# Patient Record
Sex: Female | Born: 1975
Health system: Southern US, Community
[De-identification: ages and names within clinical notes are randomized; demographics above are authoritative.]

## PROBLEM LIST (undated history)

## (undated) DIAGNOSIS — Q8500 Neurofibromatosis, unspecified: Secondary | ICD-10-CM

## (undated) DIAGNOSIS — Z8619 Personal history of other infectious and parasitic diseases: Secondary | ICD-10-CM

## (undated) DIAGNOSIS — D649 Anemia, unspecified: Secondary | ICD-10-CM

## (undated) DIAGNOSIS — Z8759 Personal history of other complications of pregnancy, childbirth and the puerperium: Secondary | ICD-10-CM

## (undated) DIAGNOSIS — J45909 Unspecified asthma, uncomplicated: Secondary | ICD-10-CM

## (undated) DIAGNOSIS — F419 Anxiety disorder, unspecified: Secondary | ICD-10-CM

## (undated) DIAGNOSIS — Z86018 Personal history of other benign neoplasm: Secondary | ICD-10-CM

## (undated) DIAGNOSIS — J189 Pneumonia, unspecified organism: Secondary | ICD-10-CM

## (undated) DIAGNOSIS — J302 Other seasonal allergic rhinitis: Secondary | ICD-10-CM

## (undated) HISTORY — DX: Neurofibromatosis, unspecified: Q85.00

## (undated) HISTORY — PX: WISDOM TOOTH EXTRACTION: SHX21

## (undated) HISTORY — DX: Anemia, unspecified: D64.9

## (undated) HISTORY — PX: ABDOMINAL HYSTERECTOMY: SHX81

---

## 1997-09-28 DIAGNOSIS — Z8759 Personal history of other complications of pregnancy, childbirth and the puerperium: Secondary | ICD-10-CM

## 1997-09-28 HISTORY — DX: Personal history of other complications of pregnancy, childbirth and the puerperium: Z87.59

## 1998-09-28 HISTORY — PX: LAPAROSCOPY FOR ECTOPIC PREGNANCY: SUR765

## 2001-08-15 ENCOUNTER — Other Ambulatory Visit: Admission: RE | Admit: 2001-08-15 | Discharge: 2001-08-15 | Payer: Self-pay | Admitting: *Deleted

## 2002-01-16 ENCOUNTER — Ambulatory Visit (HOSPITAL_COMMUNITY): Admission: RE | Admit: 2002-01-16 | Discharge: 2002-01-16 | Payer: Self-pay | Admitting: Neurology

## 2002-01-16 ENCOUNTER — Encounter: Payer: Self-pay | Admitting: Neurology

## 2002-01-20 ENCOUNTER — Encounter: Payer: Self-pay | Admitting: Neurology

## 2002-01-20 ENCOUNTER — Ambulatory Visit (HOSPITAL_COMMUNITY): Admission: RE | Admit: 2002-01-20 | Discharge: 2002-01-20 | Payer: Self-pay | Admitting: Neurology

## 2002-05-30 ENCOUNTER — Encounter: Payer: Self-pay | Admitting: Emergency Medicine

## 2002-05-30 ENCOUNTER — Encounter: Admission: RE | Admit: 2002-05-30 | Discharge: 2002-05-30 | Payer: Self-pay | Admitting: Emergency Medicine

## 2002-08-16 ENCOUNTER — Other Ambulatory Visit: Admission: RE | Admit: 2002-08-16 | Discharge: 2002-08-16 | Payer: Self-pay | Admitting: Obstetrics and Gynecology

## 2003-05-31 ENCOUNTER — Other Ambulatory Visit: Admission: RE | Admit: 2003-05-31 | Discharge: 2003-05-31 | Payer: Self-pay | Admitting: Obstetrics and Gynecology

## 2003-06-15 ENCOUNTER — Ambulatory Visit (HOSPITAL_COMMUNITY): Admission: RE | Admit: 2003-06-15 | Discharge: 2003-06-15 | Payer: Self-pay | Admitting: Neurology

## 2003-06-15 ENCOUNTER — Encounter: Payer: Self-pay | Admitting: Neurology

## 2003-08-17 ENCOUNTER — Other Ambulatory Visit: Admission: RE | Admit: 2003-08-17 | Discharge: 2003-08-17 | Payer: Self-pay | Admitting: Obstetrics and Gynecology

## 2003-09-29 DIAGNOSIS — Z8619 Personal history of other infectious and parasitic diseases: Secondary | ICD-10-CM

## 2003-09-29 HISTORY — DX: Personal history of other infectious and parasitic diseases: Z86.19

## 2004-05-15 ENCOUNTER — Encounter: Admission: RE | Admit: 2004-05-15 | Discharge: 2004-05-15 | Payer: Self-pay | Admitting: Emergency Medicine

## 2004-05-18 ENCOUNTER — Encounter: Admission: RE | Admit: 2004-05-18 | Discharge: 2004-05-18 | Payer: Self-pay | Admitting: Emergency Medicine

## 2004-06-04 ENCOUNTER — Encounter: Admission: RE | Admit: 2004-06-04 | Discharge: 2004-06-04 | Payer: Self-pay | Admitting: Emergency Medicine

## 2004-08-19 ENCOUNTER — Other Ambulatory Visit: Admission: RE | Admit: 2004-08-19 | Discharge: 2004-08-19 | Payer: Self-pay | Admitting: Obstetrics and Gynecology

## 2005-04-24 ENCOUNTER — Emergency Department (HOSPITAL_COMMUNITY): Admission: EM | Admit: 2005-04-24 | Discharge: 2005-04-25 | Payer: Self-pay | Admitting: Emergency Medicine

## 2005-04-30 ENCOUNTER — Encounter: Admission: RE | Admit: 2005-04-30 | Discharge: 2005-04-30 | Payer: Self-pay | Admitting: Emergency Medicine

## 2005-06-21 ENCOUNTER — Encounter: Admission: RE | Admit: 2005-06-21 | Discharge: 2005-06-21 | Payer: Self-pay | Admitting: Neurology

## 2005-06-23 ENCOUNTER — Encounter: Admission: RE | Admit: 2005-06-23 | Discharge: 2005-06-23 | Payer: Self-pay | Admitting: Neurology

## 2005-09-01 ENCOUNTER — Other Ambulatory Visit: Admission: RE | Admit: 2005-09-01 | Discharge: 2005-09-01 | Payer: Self-pay | Admitting: Obstetrics and Gynecology

## 2005-09-15 ENCOUNTER — Other Ambulatory Visit: Admission: RE | Admit: 2005-09-15 | Discharge: 2005-09-15 | Payer: Self-pay | Admitting: Obstetrics and Gynecology

## 2006-01-27 ENCOUNTER — Encounter: Payer: Self-pay | Admitting: Neurology

## 2006-03-04 ENCOUNTER — Other Ambulatory Visit: Admission: RE | Admit: 2006-03-04 | Discharge: 2006-03-04 | Payer: Self-pay | Admitting: Obstetrics & Gynecology

## 2006-10-14 ENCOUNTER — Other Ambulatory Visit: Admission: RE | Admit: 2006-10-14 | Discharge: 2006-10-14 | Payer: Self-pay | Admitting: Obstetrics & Gynecology

## 2007-06-15 ENCOUNTER — Encounter: Admission: RE | Admit: 2007-06-15 | Discharge: 2007-06-15 | Payer: Self-pay | Admitting: Emergency Medicine

## 2007-10-24 ENCOUNTER — Other Ambulatory Visit: Admission: RE | Admit: 2007-10-24 | Discharge: 2007-10-24 | Payer: Self-pay | Admitting: Obstetrics and Gynecology

## 2008-10-25 ENCOUNTER — Other Ambulatory Visit: Admission: RE | Admit: 2008-10-25 | Discharge: 2008-10-25 | Payer: Self-pay | Admitting: Obstetrics and Gynecology

## 2008-11-02 ENCOUNTER — Other Ambulatory Visit: Admission: RE | Admit: 2008-11-02 | Discharge: 2008-11-02 | Payer: Self-pay | Admitting: Obstetrics and Gynecology

## 2010-11-24 ENCOUNTER — Emergency Department (HOSPITAL_COMMUNITY): Payer: No Typology Code available for payment source

## 2010-11-24 ENCOUNTER — Emergency Department (HOSPITAL_COMMUNITY)
Admission: EM | Admit: 2010-11-24 | Discharge: 2010-11-24 | Disposition: A | Discharge status: Home / Self Care | Payer: No Typology Code available for payment source | Attending: Emergency Medicine | Admitting: Emergency Medicine

## 2010-11-24 DIAGNOSIS — M549 Dorsalgia, unspecified: Secondary | ICD-10-CM | POA: Insufficient documentation

## 2010-11-24 DIAGNOSIS — R0789 Other chest pain: Secondary | ICD-10-CM | POA: Insufficient documentation

## 2010-11-24 DIAGNOSIS — M25569 Pain in unspecified knee: Secondary | ICD-10-CM | POA: Insufficient documentation

## 2010-11-24 DIAGNOSIS — Z043 Encounter for examination and observation following other accident: Secondary | ICD-10-CM | POA: Insufficient documentation

## 2011-05-04 ENCOUNTER — Ambulatory Visit: Payer: No Typology Code available for payment source | Admitting: Internal Medicine

## 2012-04-27 ENCOUNTER — Telehealth: Payer: Self-pay

## 2012-04-27 NOTE — Telephone Encounter (Signed)
Per CY-if okay with Dr Sharyn Lull we can give his vaccine here per his protocol for her convenience. If she is not a patient at [facility] then CY should see her once to create a chart.

## 2012-04-28 NOTE — Telephone Encounter (Signed)
Katie,Braiden called back. She just saw Dr.Koslow and doesn't want to have to pay for a new pt.visit. I explained to her That it is Dr.Young's protocol to see the pt.if we're going to administer another Dr.'s vaccine. She went on to say Dr.Koslow said she shouldn't need an ov. Will you please call her back and see if you have better luck. [phone] (You'll have to leave a message,she will call you back.)

## 2012-04-28 NOTE — Telephone Encounter (Signed)
I called Lacey Lee this morning,lmom ask her to call me back.

## 2012-04-29 NOTE — Telephone Encounter (Signed)
Per CY-he is okay with patient getting injections here; she will need to at least see CY once as OV to establish-this way the patient and CY know who each other are. Also, we will have to get all vaccine information from Dr Annitta Jersey office and how he gives the injection. We will also obtain a"go-to" person to speak directly with at [facility] office in case there are questions/concerns about patient or vaccine. Pt is aware of this and has appt on 05-20-12 at 1115am with CY. Pt is due for 1st shot on 05-19-12 and will speak with Koslow then. Pt will keep in touch with me. I will sign off on message.

## 2012-05-19 ENCOUNTER — Encounter: Payer: Self-pay | Admitting: Internal Medicine

## 2012-05-20 ENCOUNTER — Ambulatory Visit (INDEPENDENT_AMBULATORY_CARE_PROVIDER_SITE_OTHER): Payer: BC Managed Care – PPO | Admitting: Internal Medicine

## 2012-05-20 ENCOUNTER — Encounter: Payer: Self-pay | Admitting: Internal Medicine

## 2012-05-20 VITALS — BP 128/88 | HR 89 | Ht 62.0 in | Wt 223.0 lb

## 2012-05-20 DIAGNOSIS — J45909 Unspecified asthma, uncomplicated: Secondary | ICD-10-CM

## 2012-05-20 DIAGNOSIS — J4520 Mild intermittent asthma, uncomplicated: Secondary | ICD-10-CM

## 2012-05-20 DIAGNOSIS — J309 Allergic rhinitis, unspecified: Secondary | ICD-10-CM

## 2012-05-20 DIAGNOSIS — J3089 Other allergic rhinitis: Secondary | ICD-10-CM

## 2012-05-20 NOTE — Patient Instructions (Addendum)
Ok to start Dr Kathyrn Lass vaccine injections here per his direction.

## 2012-05-20 NOTE — Progress Notes (Signed)
05/20/12- 36 yoF never smoker, working here. 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 She gives a history of allergic rhinitis with broadly positive reaction on skin testing. Asthma has been mild and intermittent, particularly with animal exposure. She is otherwise healthy and not pregnant with no cardiopulmonary disease and no history of anaphylaxis. 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  Prior to Admission medications   Medication Sig Start Date End Date Taking? Authorizing Provider  albuterol (PROVENTIL HFA;VENTOLIN HFA) 108 (90 BASE) MCG/ACT inhaler Inhale 2 puffs into the lungs every 6 (six) hours as needed.   Yes Historical Provider, MD  cetirizine (ZYRTEC) 10 MG tablet Take 10 mg by mouth daily.   Yes Historical Provider, MD  EPIPEN 2-PAK 0.3 MG/0.3ML DEVI  05/19/12  Yes Historical Provider, MD  etonogestrel-ethinyl estradiol (NUVARING) 0.12-0.015 MG/24HR vaginal ring Place 1 each vaginally every 28 (twenty-eight) days. Insert vaginally and leave in place for 3 consecutive weeks, then remove for 1 week.   Yes Historical Provider, MD  Cholecalciferol (VITAMIN D PO) Take by mouth.    Historical Provider, MD   Past Medical History  Diagnosis Date  . Neurofibromatosis   . Ectopic pregnancy    Past Surgical History  Procedure Date  . Fallopian tube removed in college   Family History  Problem Relation Age of Onset  . Allergies Mother     food  . Lung cancer Father    History   Social History  . Marital Status: Single    Spouse Name: N/A    Number of Children: 0  . Years of Education: N/A   Occupational History  . 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 Smoking status: Never Smoker   . Smokeless tobacco: Not on file  . Alcohol Use: No  . Drug Use: No  . Sexually Active: Not on file   Other Topics Concern  . Not  on file   Social History Narrative  . No narrative on file   ROS-see HPI Constitutional:   No-   weight loss, night sweats, fevers, chills, fatigue, lassitude. HEENT:   No-  headaches, difficulty swallowing, tooth/dental problems, sore throat,       No-  sneezing, itching, ear ache, nasal congestion, post nasal drip,  CV:  No-   chest pain, orthopnea, PND, swelling in lower extremities, anasarca, dizziness, palpitations Resp: No-   shortness of breath with exertion or at rest.              No-   productive cough,  No non-productive cough,  No- coughing up of blood.              No-   change in color of mucus.  No- wheezing.     OBJ- Physical Exam General- Alert, Oriented, Affect-appropriate, Distress- none acute Skin- rash-none, lesions- none, excoriation- none Lymphadenopathy- none Head- atraumatic            Eyes- Gross vision intact, PERRLA, conjunctivae and secretions clear            Ears- Hearing, canals-normal            Nose- + pale edematous nasal mucosa, no-Septal dev, mucus, polyps, erosion, perforation             Throat- Mallampati II-III , mucosa clear , drainage-  none, tonsils- atrophic Neck- flexible , trachea midline, no stridor , thyroid nl, carotid no bruit Chest - symmetrical excursion , unlabored           Heart/CV- RRR , no murmur , no gallop  , no rub, nl s1 s2                           - JVD- none , edema- none, stasis changes- none, varices- none           Lung- clear to P&A, wheeze- none, cough- none , dullness-none, rub- none           Chest wall-  Abd-  Br/ Gen/ Rectal- Not done, not indicated Extrem- cyanosis- none, clubbing, none, atrophy- none, strength- nl Neuro- grossly intact to observation

## 2012-05-29 ENCOUNTER — Encounter: Payer: Self-pay | Admitting: Internal Medicine

## 2012-05-29 DIAGNOSIS — J4520 Mild intermittent asthma, uncomplicated: Secondary | ICD-10-CM | POA: Insufficient documentation

## 2012-05-29 DIAGNOSIS — J302 Other seasonal allergic rhinitis: Secondary | ICD-10-CM | POA: Insufficient documentation

## 2012-05-29 NOTE — Assessment & Plan Note (Signed)
Our allergy laboratory will give Dr. Kathyrn Lass vaccine per his directions. We reinforced his discussion of risk and routine care.Marland Kitchen

## 2012-06-01 ENCOUNTER — Ambulatory Visit (INDEPENDENT_AMBULATORY_CARE_PROVIDER_SITE_OTHER): Payer: BC Managed Care – PPO

## 2012-06-01 DIAGNOSIS — J309 Allergic rhinitis, unspecified: Secondary | ICD-10-CM

## 2012-06-06 ENCOUNTER — Ambulatory Visit (INDEPENDENT_AMBULATORY_CARE_PROVIDER_SITE_OTHER): Payer: BC Managed Care – PPO

## 2012-06-06 DIAGNOSIS — J309 Allergic rhinitis, unspecified: Secondary | ICD-10-CM

## 2012-06-13 ENCOUNTER — Ambulatory Visit (INDEPENDENT_AMBULATORY_CARE_PROVIDER_SITE_OTHER): Payer: BC Managed Care – PPO

## 2012-06-13 DIAGNOSIS — J309 Allergic rhinitis, unspecified: Secondary | ICD-10-CM

## 2012-06-20 ENCOUNTER — Ambulatory Visit (INDEPENDENT_AMBULATORY_CARE_PROVIDER_SITE_OTHER): Payer: BC Managed Care – PPO

## 2012-06-20 DIAGNOSIS — J309 Allergic rhinitis, unspecified: Secondary | ICD-10-CM

## 2012-07-04 ENCOUNTER — Ambulatory Visit (INDEPENDENT_AMBULATORY_CARE_PROVIDER_SITE_OTHER): Payer: BC Managed Care – PPO

## 2012-07-04 DIAGNOSIS — J309 Allergic rhinitis, unspecified: Secondary | ICD-10-CM

## 2012-07-11 ENCOUNTER — Ambulatory Visit (INDEPENDENT_AMBULATORY_CARE_PROVIDER_SITE_OTHER): Payer: BC Managed Care – PPO

## 2012-07-11 DIAGNOSIS — J309 Allergic rhinitis, unspecified: Secondary | ICD-10-CM

## 2012-07-25 ENCOUNTER — Ambulatory Visit (INDEPENDENT_AMBULATORY_CARE_PROVIDER_SITE_OTHER): Payer: BC Managed Care – PPO

## 2012-07-25 DIAGNOSIS — J309 Allergic rhinitis, unspecified: Secondary | ICD-10-CM

## 2012-08-01 ENCOUNTER — Ambulatory Visit (INDEPENDENT_AMBULATORY_CARE_PROVIDER_SITE_OTHER): Payer: BC Managed Care – PPO

## 2012-08-01 DIAGNOSIS — J309 Allergic rhinitis, unspecified: Secondary | ICD-10-CM

## 2012-08-08 ENCOUNTER — Ambulatory Visit (INDEPENDENT_AMBULATORY_CARE_PROVIDER_SITE_OTHER): Payer: BC Managed Care – PPO

## 2012-08-08 DIAGNOSIS — J309 Allergic rhinitis, unspecified: Secondary | ICD-10-CM

## 2012-08-17 ENCOUNTER — Ambulatory Visit (INDEPENDENT_AMBULATORY_CARE_PROVIDER_SITE_OTHER): Payer: BC Managed Care – PPO

## 2012-08-17 DIAGNOSIS — J309 Allergic rhinitis, unspecified: Secondary | ICD-10-CM

## 2012-08-22 ENCOUNTER — Ambulatory Visit (INDEPENDENT_AMBULATORY_CARE_PROVIDER_SITE_OTHER): Payer: BC Managed Care – PPO

## 2012-08-22 DIAGNOSIS — J309 Allergic rhinitis, unspecified: Secondary | ICD-10-CM

## 2012-09-09 ENCOUNTER — Ambulatory Visit (INDEPENDENT_AMBULATORY_CARE_PROVIDER_SITE_OTHER): Payer: Managed Care, Other (non HMO)

## 2012-09-09 DIAGNOSIS — J309 Allergic rhinitis, unspecified: Secondary | ICD-10-CM

## 2012-09-16 ENCOUNTER — Ambulatory Visit (INDEPENDENT_AMBULATORY_CARE_PROVIDER_SITE_OTHER): Payer: Managed Care, Other (non HMO)

## 2012-09-16 DIAGNOSIS — J309 Allergic rhinitis, unspecified: Secondary | ICD-10-CM

## 2012-09-23 ENCOUNTER — Ambulatory Visit (INDEPENDENT_AMBULATORY_CARE_PROVIDER_SITE_OTHER): Payer: Managed Care, Other (non HMO)

## 2012-09-23 DIAGNOSIS — J309 Allergic rhinitis, unspecified: Secondary | ICD-10-CM

## 2012-09-30 ENCOUNTER — Ambulatory Visit (INDEPENDENT_AMBULATORY_CARE_PROVIDER_SITE_OTHER): Payer: Managed Care, Other (non HMO)

## 2012-09-30 DIAGNOSIS — J309 Allergic rhinitis, unspecified: Secondary | ICD-10-CM

## 2012-10-10 ENCOUNTER — Ambulatory Visit (INDEPENDENT_AMBULATORY_CARE_PROVIDER_SITE_OTHER): Payer: Self-pay

## 2012-10-10 DIAGNOSIS — J309 Allergic rhinitis, unspecified: Secondary | ICD-10-CM

## 2012-10-14 ENCOUNTER — Ambulatory Visit (INDEPENDENT_AMBULATORY_CARE_PROVIDER_SITE_OTHER): Payer: Self-pay

## 2012-10-14 DIAGNOSIS — J309 Allergic rhinitis, unspecified: Secondary | ICD-10-CM

## 2012-10-21 ENCOUNTER — Ambulatory Visit (INDEPENDENT_AMBULATORY_CARE_PROVIDER_SITE_OTHER): Payer: Self-pay

## 2012-10-21 DIAGNOSIS — J309 Allergic rhinitis, unspecified: Secondary | ICD-10-CM

## 2012-10-28 ENCOUNTER — Ambulatory Visit (INDEPENDENT_AMBULATORY_CARE_PROVIDER_SITE_OTHER): Payer: Self-pay

## 2012-10-28 DIAGNOSIS — J309 Allergic rhinitis, unspecified: Secondary | ICD-10-CM

## 2012-11-04 ENCOUNTER — Ambulatory Visit (INDEPENDENT_AMBULATORY_CARE_PROVIDER_SITE_OTHER): Payer: Self-pay

## 2012-11-04 DIAGNOSIS — J309 Allergic rhinitis, unspecified: Secondary | ICD-10-CM

## 2012-11-11 ENCOUNTER — Ambulatory Visit: Payer: Self-pay

## 2012-11-25 ENCOUNTER — Ambulatory Visit (INDEPENDENT_AMBULATORY_CARE_PROVIDER_SITE_OTHER): Payer: Self-pay

## 2012-11-30 ENCOUNTER — Encounter (HOSPITAL_COMMUNITY): Payer: Self-pay | Admitting: Pharmacist

## 2012-12-02 ENCOUNTER — Ambulatory Visit: Payer: Self-pay

## 2012-12-09 ENCOUNTER — Encounter (HOSPITAL_COMMUNITY)
Admission: RE | Admit: 2012-12-09 | Discharge: 2012-12-09 | Disposition: A | Discharge status: Home / Self Care | Payer: PRIVATE HEALTH INSURANCE | Source: Ambulatory Visit | Attending: Obstetrics and Gynecology | Admitting: Obstetrics and Gynecology

## 2012-12-09 ENCOUNTER — Encounter (HOSPITAL_COMMUNITY): Payer: Self-pay

## 2012-12-09 HISTORY — DX: Unspecified asthma, uncomplicated: J45.909

## 2012-12-09 HISTORY — DX: Other seasonal allergic rhinitis: J30.2

## 2012-12-09 LAB — SURGICAL PCR SCREEN
MRSA, PCR: NEGATIVE
Staphylococcus aureus: NEGATIVE

## 2012-12-09 LAB — CBC
Hemoglobin: 12.8 g/dL (ref 12.0–15.0)
MCHC: 33.5 g/dL (ref 30.0–36.0)
MCV: 89.3 fL (ref 78.0–100.0)
WBC: 7.2 10*3/uL (ref 4.0–10.5)

## 2012-12-09 NOTE — Patient Instructions (Addendum)
   Your procedure is scheduled ZO:[id number] March 21st  Enter through the Main Entrance of [facility] at:9:30am Pick up the phone at the desk and dial [phone] and inform us of your arrival.  Please call this number if you have any problems the morning of surgery: [phone]  Remember: Do not eat or drink anything after midnight on Thursday Please bring your inhaler with you day of surgery  Do not wear jewelry, make-up, or FINGER nail polish No metal in your hair or on your body. Do not wear lotions, powders, perfumes. You may wear deodorant.  Please use your CHG wash as directed prior to surgery.  Do not shave anywhere for at least 12 hours prior to first CHG shower.  Do not bring valuables to the hospital.   Leave suitcase in the car. After Surgery it may be brought to your room. For patients being admitted to the hospital, checkout time is 11:00am the day of discharge.  Patients discharged on the day of surgery will not be allowed to drive home.

## 2012-12-16 ENCOUNTER — Encounter (HOSPITAL_COMMUNITY): Payer: Self-pay | Admitting: Anesthesiology

## 2012-12-16 ENCOUNTER — Encounter (HOSPITAL_COMMUNITY)
Admission: RE | Disposition: A | Discharge status: Home / Self Care | Payer: Self-pay | Source: Ambulatory Visit | Attending: Obstetrics and Gynecology

## 2012-12-16 ENCOUNTER — Ambulatory Visit (HOSPITAL_COMMUNITY)
Admission: RE | Admit: 2012-12-16 | Discharge: 2012-12-17 | Disposition: A | Discharge status: Home / Self Care | Payer: PRIVATE HEALTH INSURANCE | Source: Ambulatory Visit | Attending: Obstetrics and Gynecology | Admitting: Obstetrics and Gynecology

## 2012-12-16 ENCOUNTER — Ambulatory Visit (HOSPITAL_COMMUNITY): Payer: PRIVATE HEALTH INSURANCE | Admitting: Anesthesiology

## 2012-12-16 DIAGNOSIS — D251 Intramural leiomyoma of uterus: Secondary | ICD-10-CM | POA: Insufficient documentation

## 2012-12-16 DIAGNOSIS — N803 Endometriosis of pelvic peritoneum, unspecified: Secondary | ICD-10-CM | POA: Insufficient documentation

## 2012-12-16 DIAGNOSIS — N920 Excessive and frequent menstruation with regular cycle: Secondary | ICD-10-CM

## 2012-12-16 DIAGNOSIS — D250 Submucous leiomyoma of uterus: Secondary | ICD-10-CM

## 2012-12-16 DIAGNOSIS — N802 Endometriosis of fallopian tube: Secondary | ICD-10-CM | POA: Insufficient documentation

## 2012-12-16 DIAGNOSIS — N80209 Endometriosis of unspecified fallopian tube, unspecified depth: Secondary | ICD-10-CM | POA: Insufficient documentation

## 2012-12-16 HISTORY — PX: ROBOTIC ASSISTED LAPAROSCOPIC LYSIS OF ADHESION: SHX6080

## 2012-12-16 HISTORY — PX: ROBOT ASSISTED MYOMECTOMY: SHX5142

## 2012-12-16 LAB — TYPE AND SCREEN

## 2012-12-16 LAB — ABO/RH: ABO/RH(D): O POS

## 2012-12-16 SURGERY — ROBOTIC ASSISTED MYOMECTOMY
Anesthesia: General | Site: Abdomen | Wound class: Clean Contaminated

## 2012-12-16 MED ORDER — FENTANYL CITRATE 0.05 MG/ML IJ SOLN
INTRAMUSCULAR | Status: AC
  Administered 2012-12-16: 50 ug via INTRAVENOUS
  Filled 2012-12-16: qty 2

## 2012-12-16 MED ORDER — LACTATED RINGERS IV SOLN
INTRAVENOUS | Status: DC
  Administered 2012-12-16 (×3): via INTRAVENOUS

## 2012-12-16 MED ORDER — INDIGOTINDISULFONATE SODIUM 8 MG/ML IJ SOLN
INTRAMUSCULAR | Status: DC | PRN
  Administered 2012-12-16: 1 mL via INTRAVENOUS

## 2012-12-16 MED ORDER — BUPIVACAINE-EPINEPHRINE PF 0.25-1:200000 % IJ SOLN
INTRAMUSCULAR | Status: AC
  Filled 2012-12-16: qty 30

## 2012-12-16 MED ORDER — ROCURONIUM BROMIDE 100 MG/10ML IV SOLN
INTRAVENOUS | Status: DC | PRN
  Administered 2012-12-16: 45 mg via INTRAVENOUS
  Administered 2012-12-16: 20 mg via INTRAVENOUS
  Administered 2012-12-16: 5 mg via INTRAVENOUS

## 2012-12-16 MED ORDER — MIDAZOLAM HCL 2 MG/2ML IJ SOLN
INTRAMUSCULAR | Status: AC
  Filled 2012-12-16: qty 2

## 2012-12-16 MED ORDER — LACTATED RINGERS IR SOLN
Status: DC | PRN
  Administered 2012-12-16: 100 mL

## 2012-12-16 MED ORDER — PROPOFOL 10 MG/ML IV EMUL
INTRAVENOUS | Status: AC
  Filled 2012-12-16: qty 20

## 2012-12-16 MED ORDER — ARTIFICIAL TEARS OP OINT
TOPICAL_OINTMENT | OPHTHALMIC | Status: AC
  Filled 2012-12-16: qty 3.5

## 2012-12-16 MED ORDER — ACETAMINOPHEN 10 MG/ML IV SOLN
1000.0000 mg | Freq: Once | INTRAVENOUS | Status: AC
  Administered 2012-12-16: 1000 mg via INTRAVENOUS

## 2012-12-16 MED ORDER — DEXAMETHASONE SODIUM PHOSPHATE 4 MG/ML IJ SOLN
INTRAMUSCULAR | Status: DC | PRN
  Administered 2012-12-16: 10 mg via INTRAVENOUS

## 2012-12-16 MED ORDER — LIDOCAINE HCL (CARDIAC) 20 MG/ML IV SOLN
INTRAVENOUS | Status: DC | PRN
  Administered 2012-12-16 (×2): 50 mg via INTRAVENOUS

## 2012-12-16 MED ORDER — GLYCOPYRROLATE 0.2 MG/ML IJ SOLN
INTRAMUSCULAR | Status: DC | PRN
  Administered 2012-12-16: .08 mg via INTRAVENOUS

## 2012-12-16 MED ORDER — NEOSTIGMINE METHYLSULFATE 1 MG/ML IJ SOLN
INTRAMUSCULAR | Status: AC
  Filled 2012-12-16: qty 1

## 2012-12-16 MED ORDER — ONDANSETRON HCL 4 MG/2ML IJ SOLN: 4.0000 mg | Freq: Four times a day (QID) | INTRAMUSCULAR | Status: DC | PRN

## 2012-12-16 MED ORDER — FENTANYL CITRATE 0.05 MG/ML IJ SOLN
INTRAMUSCULAR | Status: AC
  Filled 2012-12-16: qty 2

## 2012-12-16 MED ORDER — DEXAMETHASONE SODIUM PHOSPHATE 10 MG/ML IJ SOLN
INTRAMUSCULAR | Status: AC
  Filled 2012-12-16: qty 1

## 2012-12-16 MED ORDER — HYDROMORPHONE HCL PF 1 MG/ML IJ SOLN: 0.2000 mg | INTRAMUSCULAR | Status: DC | PRN

## 2012-12-16 MED ORDER — CEFAZOLIN SODIUM-DEXTROSE 2-3 GM-% IV SOLR
INTRAVENOUS | Status: AC
  Filled 2012-12-16: qty 50

## 2012-12-16 MED ORDER — SODIUM CHLORIDE 0.9 % IJ SOLN
INTRAMUSCULAR | Status: DC | PRN
  Administered 2012-12-16: 50 mL via INTRAVENOUS

## 2012-12-16 MED ORDER — LACTATED RINGERS IV SOLN: INTRAVENOUS | Status: DC

## 2012-12-16 MED ORDER — LACTATED RINGERS IR SOLN
Status: DC | PRN
  Administered 2012-12-16: 3000 mL

## 2012-12-16 MED ORDER — VASOPRESSIN 20 UNIT/ML IJ SOLN
INTRAMUSCULAR | Status: AC
  Filled 2012-12-16: qty 1

## 2012-12-16 MED ORDER — INFLUENZA VIRUS VACC SPLIT PF IM SUSP
0.2500 mL | INTRAMUSCULAR | Status: AC
  Administered 2012-12-17: 0.5 mL via INTRAMUSCULAR
  Filled 2012-12-16: qty 0.5

## 2012-12-16 MED ORDER — ONDANSETRON HCL 4 MG/2ML IJ SOLN
INTRAMUSCULAR | Status: DC | PRN
  Administered 2012-12-16: 4 mg via INTRAVENOUS

## 2012-12-16 MED ORDER — CEFAZOLIN SODIUM-DEXTROSE 2-3 GM-% IV SOLR
2.0000 g | INTRAVENOUS | Status: AC
  Administered 2012-12-16: 2 g via INTRAVENOUS

## 2012-12-16 MED ORDER — KETOROLAC TROMETHAMINE 30 MG/ML IJ SOLN: 15.0000 mg | Freq: Once | INTRAMUSCULAR | Status: DC | PRN

## 2012-12-16 MED ORDER — ACETAMINOPHEN 10 MG/ML IV SOLN
INTRAVENOUS | Status: AC
  Filled 2012-12-16: qty 100

## 2012-12-16 MED ORDER — FENTANYL CITRATE 0.05 MG/ML IJ SOLN
INTRAMUSCULAR | Status: AC
Start: 2012-12-16 — End: 2012-12-16
  Filled 2012-12-16: qty 2

## 2012-12-16 MED ORDER — ROCURONIUM BROMIDE 100 MG/10ML IV SOLN: INTRAVENOUS | Status: DC | PRN

## 2012-12-16 MED ORDER — BUPIVACAINE-EPINEPHRINE 0.25% -1:200000 IJ SOLN
INTRAMUSCULAR | Status: DC | PRN
  Administered 2012-12-16: 12 mL

## 2012-12-16 MED ORDER — FENTANYL CITRATE 0.05 MG/ML IJ SOLN
25.0000 ug | INTRAMUSCULAR | Status: DC | PRN
  Administered 2012-12-16: 25 ug via INTRAVENOUS

## 2012-12-16 MED ORDER — ONDANSETRON HCL 4 MG/2ML IJ SOLN
INTRAMUSCULAR | Status: AC
  Filled 2012-12-16: qty 2

## 2012-12-16 MED ORDER — ONDANSETRON HCL 4 MG PO TABS: 4.0000 mg | ORAL_TABLET | Freq: Four times a day (QID) | ORAL | Status: DC | PRN

## 2012-12-16 MED ORDER — INDIGOTINDISULFONATE SODIUM 8 MG/ML IJ SOLN
INTRAMUSCULAR | Status: AC
  Filled 2012-12-16: qty 5

## 2012-12-16 MED ORDER — PROPOFOL 10 MG/ML IV EMUL
INTRAVENOUS | Status: DC | PRN
  Administered 2012-12-16: 170 mg via INTRAVENOUS
  Administered 2012-12-16: 20 mg via INTRAVENOUS

## 2012-12-16 MED ORDER — OXYCODONE-ACETAMINOPHEN 5-325 MG PO TABS
1.0000 | ORAL_TABLET | ORAL | Status: DC | PRN
  Administered 2012-12-16 – 2012-12-17 (×2): 1 via ORAL
  Filled 2012-12-16 (×2): qty 1

## 2012-12-16 MED ORDER — KETOROLAC TROMETHAMINE 30 MG/ML IJ SOLN
INTRAMUSCULAR | Status: DC | PRN
  Administered 2012-12-16: 30 mg via INTRAVENOUS

## 2012-12-16 MED ORDER — FENTANYL CITRATE 0.05 MG/ML IJ SOLN
INTRAMUSCULAR | Status: DC | PRN
  Administered 2012-12-16: 100 ug via INTRAVENOUS
  Administered 2012-12-16 (×2): 50 ug via INTRAVENOUS
  Administered 2012-12-16 (×2): 100 ug via INTRAVENOUS

## 2012-12-16 MED ORDER — FENTANYL CITRATE 0.05 MG/ML IJ SOLN
INTRAMUSCULAR | Status: AC
  Filled 2012-12-16: qty 5

## 2012-12-16 MED ORDER — GLYCOPYRROLATE 0.2 MG/ML IJ SOLN
INTRAMUSCULAR | Status: AC
  Filled 2012-12-16: qty 1

## 2012-12-16 MED ORDER — LIDOCAINE HCL (CARDIAC) 20 MG/ML IV SOLN
INTRAVENOUS | Status: AC
  Filled 2012-12-16: qty 5

## 2012-12-16 MED ORDER — MIDAZOLAM HCL 5 MG/5ML IJ SOLN
INTRAMUSCULAR | Status: DC | PRN
  Administered 2012-12-16: 2 mg via INTRAVENOUS

## 2012-12-16 MED ORDER — OXYCODONE-ACETAMINOPHEN 5-500 MG PO CAPS: 1.0000 | ORAL_CAPSULE | ORAL | Status: DC | PRN

## 2012-12-16 MED ORDER — VASOPRESSIN 20 UNIT/ML IJ SOLN
INTRAVENOUS | Status: DC | PRN
  Administered 2012-12-16: 14:00:00 via INTRAMUSCULAR

## 2012-12-16 MED ORDER — NEOSTIGMINE METHYLSULFATE 1 MG/ML IJ SOLN
INTRAMUSCULAR | Status: DC | PRN
  Administered 2012-12-16: 4 mg via INTRAVENOUS

## 2012-12-16 SURGICAL SUPPLY — 58 items
ADAPTER CATH SYR TO TUBING 38M (ADAPTER) IMPLANT
BARRIER ADHS 3X4 INTERCEED (GAUZE/BANDAGES/DRESSINGS) IMPLANT
BLADE LAP MORCELLATOR 15X9.5 (ELECTROSURGICAL) IMPLANT
CATH ROBINSON RED A/P 16FR (CATHETERS) ×3 IMPLANT
CHLORAPREP W/TINT 26ML (MISCELLANEOUS) ×3 IMPLANT
CLOTH BEACON ORANGE TIMEOUT ST (SAFETY) ×3 IMPLANT
CONT PATH 16OZ SNAP LID 3702 (MISCELLANEOUS) ×3 IMPLANT
COVER MAYO STAND STRL (DRAPES) ×3 IMPLANT
COVER TABLE BACK 60X90 (DRAPES) ×6 IMPLANT
COVER TIP SHEARS 8 DVNC (MISCELLANEOUS) ×2 IMPLANT
COVER TIP SHEARS 8MM DA VINCI (MISCELLANEOUS) ×1
DECANTER SPIKE VIAL GLASS SM (MISCELLANEOUS) ×6 IMPLANT
DERMABOND ADVANCED (GAUZE/BANDAGES/DRESSINGS) ×1
DERMABOND ADVANCED .7 DNX12 (GAUZE/BANDAGES/DRESSINGS) ×2 IMPLANT
DEVICE TROCAR PUNCTURE CLOSURE (ENDOMECHANICALS) ×3 IMPLANT
DRAPE HUG U DISPOSABLE (DRAPE) ×3 IMPLANT
DRAPE LG THREE QUARTER DISP (DRAPES) ×6 IMPLANT
DRAPE WARM FLUID 44X44 (DRAPE) ×3 IMPLANT
ELECT REM PT RETURN 9FT ADLT (ELECTROSURGICAL) ×3
ELECTRODE REM PT RTRN 9FT ADLT (ELECTROSURGICAL) ×2 IMPLANT
EVACUATOR SMOKE 8.L (FILTER) ×3 IMPLANT
FILTER STRAW FLUID ASPIR (MISCELLANEOUS) ×3 IMPLANT
GAUZE VASELINE 3X9 (GAUZE/BANDAGES/DRESSINGS) IMPLANT
GLOVE BIO SURGEON STRL SZ8 (GLOVE) ×9 IMPLANT
GLOVE BIOGEL PI IND STRL 8.5 (GLOVE) ×2 IMPLANT
GLOVE BIOGEL PI INDICATOR 8.5 (GLOVE) ×1
GOWN STRL REIN XL XLG (GOWN DISPOSABLE) ×18 IMPLANT
KIT ABG SYR 3ML LUER SLIP (SYRINGE) ×3 IMPLANT
KIT ACCESSORY DA VINCI DISP (KITS) ×1
KIT ACCESSORY DVNC DISP (KITS) ×2 IMPLANT
LEGGING LITHOTOMY PAIR STRL (DRAPES) ×3 IMPLANT
NEEDLE INSUFFLATION 120MM (ENDOMECHANICALS) ×3 IMPLANT
NEEDLE SPNL 22GX7 QUINCKE BK (NEEDLE) IMPLANT
NS IRRIG 1000ML POUR BTL (IV SOLUTION) ×9 IMPLANT
PACK LAVH (CUSTOM PROCEDURE TRAY) ×3 IMPLANT
SCALPEL HARMONIC ACE (MISCELLANEOUS) ×3 IMPLANT
SEPRAFILM MEMBRANE 5X6 (MISCELLANEOUS) ×3 IMPLANT
SET CYSTO W/LG BORE CLAMP LF (SET/KITS/TRAYS/PACK) IMPLANT
SET IRRIG TUBING LAPAROSCOPIC (IRRIGATION / IRRIGATOR) ×3 IMPLANT
SOLUTION ELECTROLUBE (MISCELLANEOUS) ×3 IMPLANT
SUT MNCRL AB 4-0 PS2 18 (SUTURE) ×3 IMPLANT
SUT PDS AB 4-0 SH 27 (SUTURE) ×6 IMPLANT
SUT VICRYL 0 UR6 27IN ABS (SUTURE) ×6 IMPLANT
SUT VLOC 180 2-0 6IN GS21 (SUTURE) ×3 IMPLANT
SUT VLOC 180 2-0 9IN GS21 (SUTURE) IMPLANT
SUT VLOC 180 3-0 9IN GS21 (SUTURE) ×3 IMPLANT
SYR 50ML LL SCALE MARK (SYRINGE) ×3 IMPLANT
SYR 50ML SLIP (SYRINGE) ×3 IMPLANT
SYSTEM CONVERTIBLE TROCAR (TROCAR) IMPLANT
TOWEL OR 17X24 6PK STRL BLUE (TOWEL DISPOSABLE) ×9 IMPLANT
TRAY FOLEY BAG SILVER LF 14FR (CATHETERS) ×3 IMPLANT
TROCAR 12M 150ML BLUNT (TROCAR) IMPLANT
TROCAR BLADELESS OPT 12M 100M (ENDOMECHANICALS) ×3 IMPLANT
TROCAR DILATING TIP 12MM 150MM (ENDOMECHANICALS) ×3 IMPLANT
TROCAR DISP BLADELESS 8 DVNC (TROCAR) ×2 IMPLANT
TROCAR DISP BLADELESS 8MM (TROCAR) ×1
TUBING FILTER THERMOFLATOR (ELECTROSURGICAL) ×3 IMPLANT
WARMER LAPAROSCOPE (MISCELLANEOUS) ×3 IMPLANT

## 2012-12-16 NOTE — H&P (Signed)
HISTORY OF PRESENT ILLNESS:  Lacey Lee is a 37 year old, gravida 1, para 0-0-1-0, African-American female with history of uterine myoma since 2006.  She was referred to my [facility] office as a kind referral by Dr. Meredeth Ide.  She was diagnosed with menorrhagia which was attributed to small myomas in 2006, and has been doing fairly on NuvaRing since then.  She recently had an ultrasound that showed a 1.8 cm transmural myoma distorting the endometrium with multiple sub-1 cm intramural myomas elsewhere.  The patient would like to preserve her childbearing potential.  She does not wish to undergo hysterectomy.  She is currently single and not actively pursuing pregnancy.  OB HISTORY:  2000, right salpingectomy for ectopic.  The rest of the pelvis was described as normal, according to the patient.  GYN HISTORY:  Menarche at age 73.  Periods every 28 days on NuvaRing (withdrawal bleeding).  They last five to six days.    She has used birth control pills for three years and NuvaRing for seven years.  She also used contraceptive skin patches in the past.  No history of IUD use.  No history of STDs.  Last Pap smear was February 2013.  No history of abnormal cytology.  No history of cervical dysplasia or procedures for dysplasia.   Past Medical History  Diagnosis Date  . Neurofibromatosis   . Ectopic pregnancy   . Seasonal allergies   . Asthma     albuterol rescue inhaler-uses prn, exacerbated by pet dander    ALLERGIES:  No known allergies.  CURRENT MEDICATIONS:  Ibuprofen and NuvaRing.  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  She is single.  She denies substance abuse.  She does not exercise regularly.  REVIEW OF SYSTEMS:  All 12 systems were reviewed as documented in the attached questionnaire.  It is pertinent for neurofibromatosis.  She gets MRI every two years.  FAMILY HISTORY:  Mother and sister have diabetes.  One sister has blood clots, and  there is neurofibromatosis in the family.  PHYSICAL EXAMINATION: BP 120/79  Pulse 97  Temp(Src) 98.1 F (36.7 C) (Oral)  Resp 18  SpO2 100%  Physical Exam  Constitutional: She is well-developed, well-nourished, and in no distress.  HENT:  Head: Normocephalic.  Eyes: Pupils are equal, round, and reactive to light.  Neck: Normal range of motion. No thyromegaly present.  Cardiovascular: Normal rate.   Pulmonary/Chest: Effort normal.  Abdominal: Soft.  Genitourinary: Vagina normal, uterus normal, cervix normal, right adnexa normal and left adnexa normal.  Musculoskeletal: Normal range of motion.  Neurological: She is alert.  Skin: Skin is warm.  Psychiatric: Affect normal.    A repeat transvaginal ultrasound was done.  It shows the uterine corpus size to be 46 x 39 x 32 mm with a corpus volume of 20-30 cubic cm.  The endometrium is thin.  There is free fluid in the endometrium.  Endometrium is 2 mm thick.  The left ovary has a volume of 4.8 cubic centimeters and the right ovary has a volume of 4.4 cubic cm.  The [facility] is 4-5 per ovary.  There were several sub-1 cm intramural myomas noted.  The dominant fibroid is noted in the right lateral to posterior aspect of the endometrium in a transmural location with about 20% of its diameter protruding into the endometrial cavity.  The thickness of the normal end of the myometrium overlying this peripherally is only 4 mm.  Thus, the patient would  not be a candidate for hysteroscopic resection as it may attenuate the uterine wall significantly.  IMPRESSION: 1.8 cm transmural myoma, with thin outer myometrium Smaller multiple im myomas Desire to preserve fertility ARA  PLAN: I discussed myomectomy at length with this patient. She is not a candidate for H/S resection.  I reviewed laparoscopic robotic assisted versus myomectomy via laparotomy.  I reviewed their benefits and risks, and alternatives. I reviewed the short term complications of robotic  assisted myomectomy as bleeding, infection, injury to bladder, uterus, fallopian tubes, ovaries, bowels, major blood vessels and nerves, and other risks of anesthesia, including aspiration pneumonia, airway trauma during a difficult intubation, brain injury and venous thromboembolism, including, rarely death. Long-term complications of myomectomy are adhesion formation, infertility, intrauterine adhesions, uterine rupture before term pregnancy.  Cesarean delivery would be recommended after such a myomectomy. I discussed with the patient DOR and decreased fecundity, increased spontaneous abortion risk and increased risk of fetal aneuploidies with advanced reproductive age. We will address this more postoperatively.

## 2012-12-16 NOTE — Progress Notes (Signed)
Dr. April Manson paged to clarify orders for discharge pertaining to patient. Patient stated she was told she would probably go home today. Order for rx on chart, but there is no order to D/C patient home.  Voicemail left for [place]- no response at this time. Will keep patient overnight.

## 2012-12-16 NOTE — Anesthesia Preprocedure Evaluation (Signed)
Anesthesia Evaluation  Patient identified by MRN, date of birth, ID band Patient awake    Reviewed: Allergy & Precautions, H&P , NPO status , Patient's Chart, lab work & pertinent test results, reviewed documented beta blocker date and time   History of Anesthesia Complications Negative for: history of anesthetic complications  Airway Mallampati: II TM Distance: >3 FB Neck ROM: full    Dental  (+) Teeth Intact   Pulmonary asthma (allergy-related, rare inhaler use) ,  breath sounds clear to auscultation  Pulmonary exam normal       Cardiovascular Exercise Tolerance: Good negative cardio ROS  Rhythm:regular Rate:Normal     Neuro/Psych negative neurological ROS  negative psych ROS   GI/Hepatic negative GI ROS, Neg liver ROS,   Endo/Other  Morbid obesity  Renal/GU negative Renal ROS  Female GU complaint     Musculoskeletal   Abdominal   Peds  Hematology negative hematology ROS (+)   Anesthesia Other Findings   Reproductive/Obstetrics negative OB ROS                           Anesthesia Physical Anesthesia Plan  ASA: III  Anesthesia Plan: General ETT   Post-op Pain Management:    Induction:   Airway Management Planned:   Additional Equipment:   Intra-op Plan:   Post-operative Plan:   Informed Consent: I have reviewed the patients History and Physical, chart, labs and discussed the procedure including the risks, benefits and alternatives for the proposed anesthesia with the patient or authorized representative who has indicated his/her understanding and acceptance.   Dental Advisory Given  Plan Discussed with: CRNA and Surgeon  Anesthesia Plan Comments:         Anesthesia Quick Evaluation

## 2012-12-16 NOTE — Anesthesia Postprocedure Evaluation (Signed)
  Anesthesia Post-op Note  Patient: Lacey Lee  Procedure(s) Performed: Procedure(s) with comments: ROBOTIC ASSISTED MYOMECTOMY (N/A) ROBOTIC ASSISTED LAPAROSCOPIC LYSIS OF ADHESION - with excision of perotoneal lesions  Patient is awake and responsive. Pain and nausea are reasonably well controlled. Vital signs are stable and clinically acceptable. Oxygen saturation is clinically acceptable. There are no apparent anesthetic complications at this time. Patient is ready for discharge.

## 2012-12-16 NOTE — Transfer of Care (Signed)
Immediate Anesthesia Transfer of Care Note  Patient: Lacey Lee  Procedure(s) Performed: Procedure(s) with comments: ROBOTIC ASSISTED MYOMECTOMY (N/A) ROBOTIC ASSISTED LAPAROSCOPIC LYSIS OF ADHESION - with excision of perotoneal lesions  Patient Location: PACU  Anesthesia Type:General  Level of Consciousness: sedated  Airway & Oxygen Therapy: Patient Spontanous Breathing and Patient connected to nasal cannula oxygen  Post-op Assessment: Report given to PACU RN  Post vital signs: Reviewed and stable  Complications: No apparent anesthesia complications

## 2012-12-16 NOTE — Op Note (Signed)
Preoperative diagnosis:  Uterine myomas and menorrhagia  Postoperative diagnosis: Uterine myomas, menorrhagia,  perihepatic adhesions, probable endometriosis (stage III, because of involvement of left proximal tube) Procedure: Laparoscopy, robotic assisted myomectomy, lysis of adhesions, excision of peritoneal lesions. Anesthesia: Gen. endotracheal  Surgeon: Fermin Schwab  Assistant: Duanne Guess, MD Complications: None  Estimated blood loss: <20 ml Specimens: Myoma specimens, peritoneal excisional biopsies to pathology.  Findings: On exam under anesthesia, external genitalia, Bartholin's, Skene's, urethra were normal. The cervix appeared normal. The uterus was normal size. No posterior cul-de-sac induration was noted. No adnexal masses were appreciated. The uterus sounded to 9 cm. On laparoscopy, there were perihepatic adhesions around the right lobe of the liver. The gallbladder was normal. The left lobe of the liver looked normal. There was a 2 cm transmural myoma with submucosal component posteriorly to the right of the corpus. There was a cystic attachment on the left uterosacral ligament. The base of the lesion showed uterosacral ligament thickening. This was excised. In addition there were peritoneal defects in the left ovarian fossa. There was a distinct vesicular lesion in the left ovarian fossa. The left fallopian tube showed mesosalpinx adhesions and also kinking with proximal dilation of the tube at mid isthmic region. A mesosalpinx pigmented lesion lateral to this kinking site with surrounding adhesion was excised. However the tube could not be unkinked. It chromotubation, the dye did not enter the short proximal segment of the tube before the kinked segment. The fimbria were scored as 4/5.  The patient may have a blockage at the uterotubal junction separately from this kinking.  The right fallopian tube was surgically absent. Both of the ovaries appeared grossly normal.  Description  of the procedure: Patient was placed in lithotomy position and general endotracheal anesthesia was given. 2 g of cefazolin were  given intravenously for prophylaxis. She was prepped and draped in sterile manner. A Foley catheter was inserted into the bladder appeared . A ZUMI uterine manipulator was inserted into the uterus for uterine manipulation and chromotubation. The uterus sounded to 9 cm.   An operative field was created on the abdomen and the surgeon was regloved. After preemptive anesthesia with quarter percent bupivacaine and 1:200,000 epinephrine, and intraumbilical skin incision was made. It varies needle was inserted and pneumoperitoneum was created with carbon dioxide. A 12 mm trocar was placed at this incision. Robotic laparoscope with 3-D camera was inserted and, under direct visualization, a left lateral 8mm incision was made and corresponding robotic trochar was placed. On the right side a second robotic trocar was placed as well as a 12 mm assistant port. The da Vinci SI robot was docked to the patient after placing her in Trendelenburg position. The surgeon continued the rest of the procedure from the surgical console. First, the cystic attachment to the left uterosacral ligament was lysed and sent to pathology. The base of it was noted to be fibrotic and this was also excised electrosurgical E. and sent to pathology. The left ovarian fossa lesion was also sent as an excisional biopsy.  A dilute vasopressin solution (0.33 units per milliliter) was injected transcutaneously into the myometrium of the uterus. Using electrosurgical scissors, incision  was made on the myomas, and myoma was dissected using blunt and electrosurgical dissection. Hemostasis was insured. A small entry into the endometrium was repaired with 4-0 Monocryl figure-of-eight suture.  The myoma defect was closed 3 layers the first and second layers were a 2-0 V-lock continuous suture and the serosa layer was a 3-0  V-lock  continuous suture. Pelvis was copiously irrigated with lactated Ringer solution and a slurry of Seprafilm (2 sheets in 40 mL of lactated Ringer solution) was instilled into the pelvis as an adhesion barrier. The umbilical incision and the 12 mm assistant port incisions were closed with 0 Vicryl figure-of-eight sutures. The skin incisions were approximated with Dermabond.   At this point the procedure was terminated hemostasis was insured. Instrument and lap pad count was correct.   The patient tolerated the procedure well and was transferred to recovery room in satisfactory condition.  Special Note: Due to to significant myometrial incisions, the patient is recommended to have cesarean delivery with future pregnancies

## 2012-12-17 NOTE — Anesthesia Postprocedure Evaluation (Signed)
  Anesthesia Post-op Note  Patient: Lacey Lee  Procedure(s) Performed: Procedure(s) with comments: ROBOTIC ASSISTED MYOMECTOMY (N/A) ROBOTIC ASSISTED LAPAROSCOPIC LYSIS OF ADHESION - with excision of perotoneal lesions  Patient Location: PACU and Women's Unit  Anesthesia Type:General  Level of Consciousness: awake, alert , oriented and patient cooperative  Airway and Oxygen Therapy: Patient Spontanous Breathing  Post-op Pain: none  Post-op Assessment: Post-op Vital signs reviewed and Patient's Cardiovascular Status Stable  Post-op Vital Signs: Reviewed and stable  Complications: No apparent anesthesia complications

## 2012-12-17 NOTE — Progress Notes (Signed)
Discharge instructions provided to patient at bedside.  Medications, activity instructions follow up appointments, community resources and when to cal the dootor discussed.  No questions at this time.  Patient left unit in wheelchair in stable condition with all personal belongings and prescriptions accompanied by staff.  Osvaldo Angst, RN--------

## 2012-12-19 ENCOUNTER — Encounter (HOSPITAL_COMMUNITY): Payer: Self-pay | Admitting: Obstetrics and Gynecology

## 2012-12-27 ENCOUNTER — Telehealth: Payer: Self-pay | Admitting: Internal Medicine

## 2012-12-27 ENCOUNTER — Ambulatory Visit (INDEPENDENT_AMBULATORY_CARE_PROVIDER_SITE_OTHER): Payer: PRIVATE HEALTH INSURANCE

## 2012-12-27 DIAGNOSIS — J309 Allergic rhinitis, unspecified: Secondary | ICD-10-CM

## 2012-12-27 NOTE — Telephone Encounter (Signed)
Ok to repeat same dose at next scheduled time and follow closely.

## 2012-12-27 NOTE — Telephone Encounter (Signed)
Pt. Has just started on 1:100(from Dr.Kozlow's office. At .05 12-13-12,small hive,pink flare.(given by his office.) Today I gave her 0.1 she had a pinhead size reaction on her lt.arm and a small whelp on her rt.arm.(She waited for 15 mins. And then we sent her home.) I called her to check on her, the one on the right is now qtr. Size. Please advise which way we need to go now. Mrs.Boak hadn't planned to come in Fri. But said she would if you need her to.

## 2012-12-28 NOTE — Telephone Encounter (Signed)
I called Mrs.Creek back yesterday afternoon and gave her your advice. Pt. Understood,she will come in next week for her next shot.

## 2012-12-30 ENCOUNTER — Ambulatory Visit: Payer: PRIVATE HEALTH INSURANCE

## 2013-01-04 ENCOUNTER — Ambulatory Visit (INDEPENDENT_AMBULATORY_CARE_PROVIDER_SITE_OTHER): Payer: PRIVATE HEALTH INSURANCE

## 2013-01-04 DIAGNOSIS — J309 Allergic rhinitis, unspecified: Secondary | ICD-10-CM

## 2013-01-06 DIAGNOSIS — N809 Endometriosis, unspecified: Secondary | ICD-10-CM | POA: Insufficient documentation

## 2013-01-10 ENCOUNTER — Ambulatory Visit: Payer: PRIVATE HEALTH INSURANCE

## 2013-01-10 ENCOUNTER — Encounter: Payer: Self-pay | Admitting: *Deleted

## 2013-01-11 ENCOUNTER — Ambulatory Visit (INDEPENDENT_AMBULATORY_CARE_PROVIDER_SITE_OTHER): Payer: Managed Care, Other (non HMO) | Admitting: Obstetrics and Gynecology

## 2013-01-11 ENCOUNTER — Encounter: Payer: Self-pay | Admitting: Obstetrics and Gynecology

## 2013-01-11 ENCOUNTER — Ambulatory Visit (INDEPENDENT_AMBULATORY_CARE_PROVIDER_SITE_OTHER): Payer: PRIVATE HEALTH INSURANCE

## 2013-01-11 VITALS — BP 122/76 | Wt 227.0 lb

## 2013-01-11 DIAGNOSIS — J309 Allergic rhinitis, unspecified: Secondary | ICD-10-CM

## 2013-01-11 DIAGNOSIS — N801 Endometriosis of ovary: Secondary | ICD-10-CM

## 2013-01-11 NOTE — Patient Instructions (Signed)
We will call you to schedule your IUD insertion after we get clearance from your insurance.

## 2013-01-11 NOTE — Progress Notes (Addendum)
37 yo SBF G1P0 (ectopic) s/p laparoscopy with robotic myomectomy by Dr. April Manson December 16, 2012 for pelvic pain and fibroids..  He noted probable endometiosis, Stage III because of involvement of the left proximal tube.  She had a 2 cm transmural myoma posteriorly. (Previous PUS here had shown 4 fibroids, each 1-2cm, Oct 2013) This was excised, and the cavity was entered - he rec: c/s if pt becomes pregnant.  The right tube was surgically absent.  Pt is still having pelvic pain, but has not yet had a period.  She is interested in birth control.  Dr. Prudence Davidson said he would consider Femara and aygestin to suppress endometriosis.  Pt considering and IUD.  Discussed tx with Femara and Aygestin, but pt is concerned about the price and that she is not a good pill taker and might forget it fairly frequently.  Pt read online about side effects of femara and is scared of mood changes and weight gain, elevation of cholesterol.  Dr. April Manson offered her the option of oocyte banking, but pt tearfully relates to me that she doesn't think she ever wants to do that, and just wants to be out of pain.    We discussed the option for a Mirena, and pt wishes to try it before considering Femara/Aygestin.  Honestly, I think it might buy her some time to think about her fertility desires and come to terms with whatever decision she makes.  It also well could help her pain and bleeding. Questions answered.  She wishes to proceed.  Will schedule.

## 2013-01-20 ENCOUNTER — Telehealth: Payer: Self-pay | Admitting: Internal Medicine

## 2013-01-20 ENCOUNTER — Ambulatory Visit (INDEPENDENT_AMBULATORY_CARE_PROVIDER_SITE_OTHER): Payer: PRIVATE HEALTH INSURANCE

## 2013-01-20 DIAGNOSIS — J309 Allergic rhinitis, unspecified: Secondary | ICD-10-CM

## 2013-01-20 NOTE — Telephone Encounter (Signed)
Pt ca

## 2013-01-20 NOTE — Telephone Encounter (Signed)
Pt got allergy injection today,came up here around 4:30 with  A insect bite size ,warm to touch place on her left deltoid area. Advised pt to take benadryl and apply cool compress to area.  Tried to call Dr Kathyrn Lass office could not reach busy x3 and then  Office closed. Will try back on Monday.

## 2013-01-22 NOTE — Telephone Encounter (Signed)
Please see me about protocol.

## 2013-01-24 NOTE — Telephone Encounter (Signed)
I spoke with Dr.Kozlow's nurse she said repeat the dose then if the reaction is worse go back to .05. We'll give her 0 .1 if pt. Is okay with that.

## 2013-01-27 ENCOUNTER — Ambulatory Visit: Payer: Self-pay | Admitting: Obstetrics and Gynecology

## 2013-01-27 ENCOUNTER — Ambulatory Visit (INDEPENDENT_AMBULATORY_CARE_PROVIDER_SITE_OTHER): Payer: PRIVATE HEALTH INSURANCE

## 2013-01-27 DIAGNOSIS — J309 Allergic rhinitis, unspecified: Secondary | ICD-10-CM

## 2013-02-03 ENCOUNTER — Ambulatory Visit (INDEPENDENT_AMBULATORY_CARE_PROVIDER_SITE_OTHER): Payer: PRIVATE HEALTH INSURANCE

## 2013-02-03 DIAGNOSIS — J309 Allergic rhinitis, unspecified: Secondary | ICD-10-CM

## 2013-02-10 ENCOUNTER — Ambulatory Visit (INDEPENDENT_AMBULATORY_CARE_PROVIDER_SITE_OTHER): Payer: PRIVATE HEALTH INSURANCE

## 2013-02-10 DIAGNOSIS — J309 Allergic rhinitis, unspecified: Secondary | ICD-10-CM

## 2013-02-16 ENCOUNTER — Telehealth: Payer: Self-pay | Admitting: Nurse Practitioner

## 2013-02-16 NOTE — Telephone Encounter (Signed)
Patient started cycle today and wants to come in for iud.

## 2013-02-16 NOTE — Telephone Encounter (Signed)
IUD insertion scheduled for 02/17/13 w/Dr.Romine cm

## 2013-02-17 ENCOUNTER — Telehealth: Payer: Self-pay | Admitting: Obstetrics and Gynecology

## 2013-02-17 ENCOUNTER — Ambulatory Visit: Payer: PRIVATE HEALTH INSURANCE

## 2013-02-17 ENCOUNTER — Ambulatory Visit: Payer: Self-pay | Admitting: Obstetrics and Gynecology

## 2013-02-17 NOTE — Telephone Encounter (Signed)
Pt cancelled her procedure appt for today. She can't get off work and would like nurse to call and reschedule appt.

## 2013-02-17 NOTE — Telephone Encounter (Signed)
Please call and reschedule and notify her of N/S fee for procedures.

## 2013-02-21 NOTE — Telephone Encounter (Signed)
LMTCB

## 2013-02-24 ENCOUNTER — Ambulatory Visit (INDEPENDENT_AMBULATORY_CARE_PROVIDER_SITE_OTHER): Payer: PRIVATE HEALTH INSURANCE

## 2013-02-24 DIAGNOSIS — J309 Allergic rhinitis, unspecified: Secondary | ICD-10-CM

## 2013-03-03 ENCOUNTER — Ambulatory Visit (INDEPENDENT_AMBULATORY_CARE_PROVIDER_SITE_OTHER): Payer: PRIVATE HEALTH INSURANCE

## 2013-03-03 DIAGNOSIS — J309 Allergic rhinitis, unspecified: Secondary | ICD-10-CM

## 2013-03-10 ENCOUNTER — Ambulatory Visit (INDEPENDENT_AMBULATORY_CARE_PROVIDER_SITE_OTHER): Payer: PRIVATE HEALTH INSURANCE

## 2013-03-10 DIAGNOSIS — J309 Allergic rhinitis, unspecified: Secondary | ICD-10-CM

## 2013-03-17 ENCOUNTER — Ambulatory Visit: Payer: PRIVATE HEALTH INSURANCE

## 2013-03-20 ENCOUNTER — Telehealth: Payer: Self-pay | Admitting: Obstetrics and Gynecology

## 2013-03-20 NOTE — Telephone Encounter (Signed)
Call to [facility] [address] for cytotec 200mg  1 per vagina tonight, and 1 per vagina in the morning prior to procedure. Disp 2 with 0 refills. Had to call in Rx due to no route entry for PV for this med in EPIC.

## 2013-03-20 NOTE — Telephone Encounter (Signed)
Spoke with pt about use of Cytotec. Pt to insert one tab vaginally tonight, and another in the am. Called to [facility] [address]. Pt agreeable.

## 2013-03-20 NOTE — Telephone Encounter (Signed)
Spoke with pt who desires IUD. Discussed with CR at last visit in April. Pt was to have this done last month, but couldn't get off work. Pt started menses on Saturday. Pt at work today, but is off tomorrow. Scheduled OV tomorrow at 3:15 with SM. Advised pt I would check with SM about med called Cytotec she would take tonight and tomorrow morning to help soften her cervix as she has not had children. Also instructed pt to take 800 mg of ibuprofen tomorrow an hour before the appt with food. Pt agreeable. [facility] [address]

## 2013-03-20 NOTE — Telephone Encounter (Signed)
See next phone call on 03-20-13.  I called patietn as well as Amy to assist with IUD scheduling. Discussed that previous appt was canceled last minute and we usually charge a cancel fee for this and want to be sure she is going to be able to keep appt.  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 APPT with Dr Hyacinth Meeker, while CR on vacation.

## 2013-03-20 NOTE — Telephone Encounter (Signed)
Paper chart to your office.  Patient canceled last month (same day) due to being too busy at work. Notified that we usually charge a cancel fee for this, esp when for a procedure.  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

## 2013-03-20 NOTE — Telephone Encounter (Signed)
Yes. Please order cytotec per vagina tonight and tomorrow morning.  #[id number]/[id number]

## 2013-03-21 ENCOUNTER — Ambulatory Visit (INDEPENDENT_AMBULATORY_CARE_PROVIDER_SITE_OTHER): Payer: Managed Care, Other (non HMO) | Admitting: Obstetrics & Gynecology

## 2013-03-21 VITALS — BP 122/78 | HR 60 | Resp 16 | Ht 61.5 in | Wt 221.2 lb

## 2013-03-21 DIAGNOSIS — Z30430 Encounter for insertion of intrauterine contraceptive device: Secondary | ICD-10-CM

## 2013-03-21 DIAGNOSIS — N920 Excessive and frequent menstruation with regular cycle: Secondary | ICD-10-CM

## 2013-03-21 NOTE — Patient Instructions (Signed)

## 2013-03-21 NOTE — Progress Notes (Signed)
37 yrsSingleAfrican Americanfemale presents for  insertion of Mirena, Lot # [id number], Expiration date 12/15. Denies any vaginal symptoms or STD concerns.  LMP: 03/18/13  Patient read information regarding IUD insertion.  All questions addressed.  Consent obtained.  Specifically risks of perforation and ectopic pregnancy discussed.    Healthy female,time, place and person Pelvic exam: Vulva;normal Vagina:normal vagina, no discharge, exudate, lesion, or erythema Cervix:Non-tender, Negative CMT, no lesions or redness, nulliparous/parous os Uterus:normal shape, position and consistency, mobile   Procedure:  Speculum inserted into vagina. Cervix visualized and cleansed with betadine solution X 3. Tenaculum placed on cervix at 12 o'clock position(s).  Paracervical block placed.  8cc's total used.  Lot [id number], exp 11/26/13.  Uterus sounded to 8 centimeters.  IUD removed from sterile packet and under sterile conditions inserted to fundus of uterus.  Introducer removed without difficulty.  IUD string trimmed to 2 centimeters.  Remainder string given to patient to feel for identification.  Tenaculum removed.  Minimal bleeding noted.  Speculum removed.  Uterus palpated normal.  Patient tolerated procedure well.  A: Insertion of Mirena   P:  Instructions and warnings signs given.       IUD identification card given with IUD removal 6/19.       Return visit 6-8 weeks.

## 2013-03-24 ENCOUNTER — Ambulatory Visit (INDEPENDENT_AMBULATORY_CARE_PROVIDER_SITE_OTHER): Payer: PRIVATE HEALTH INSURANCE

## 2013-03-24 DIAGNOSIS — J309 Allergic rhinitis, unspecified: Secondary | ICD-10-CM

## 2013-03-29 ENCOUNTER — Ambulatory Visit (INDEPENDENT_AMBULATORY_CARE_PROVIDER_SITE_OTHER): Payer: PRIVATE HEALTH INSURANCE

## 2013-03-29 DIAGNOSIS — J309 Allergic rhinitis, unspecified: Secondary | ICD-10-CM

## 2013-04-07 ENCOUNTER — Ambulatory Visit (INDEPENDENT_AMBULATORY_CARE_PROVIDER_SITE_OTHER): Payer: PRIVATE HEALTH INSURANCE

## 2013-04-07 DIAGNOSIS — J309 Allergic rhinitis, unspecified: Secondary | ICD-10-CM

## 2013-04-14 ENCOUNTER — Ambulatory Visit (INDEPENDENT_AMBULATORY_CARE_PROVIDER_SITE_OTHER): Payer: PRIVATE HEALTH INSURANCE

## 2013-04-14 DIAGNOSIS — J309 Allergic rhinitis, unspecified: Secondary | ICD-10-CM

## 2013-04-21 ENCOUNTER — Ambulatory Visit: Payer: PRIVATE HEALTH INSURANCE

## 2013-05-09 ENCOUNTER — Encounter: Payer: Self-pay | Admitting: Obstetrics & Gynecology

## 2013-05-09 ENCOUNTER — Ambulatory Visit (INDEPENDENT_AMBULATORY_CARE_PROVIDER_SITE_OTHER): Payer: Managed Care, Other (non HMO) | Admitting: Obstetrics & Gynecology

## 2013-05-09 VITALS — BP 124/78 | HR 68 | Resp 16 | Ht 61.0 in | Wt 222.1 lb

## 2013-05-09 DIAGNOSIS — Z30431 Encounter for routine checking of intrauterine contraceptive device: Secondary | ICD-10-CM

## 2013-05-09 NOTE — Patient Instructions (Signed)
Please call if you have any questions/concerns in the future about your IUD.

## 2013-05-09 NOTE — Progress Notes (Signed)
37 y.o. G46P0020 Single African American female presents for IUD recheck.  Spotted for about a week.  Did have some cramping that first week.  First "cycle" came on time and lasted 2 days.  She reports it was almost "nothing".    LMP:  Patient's last menstrual period was 04/15/2013.  Healthy female,time, place and person Abdomen: soft, non-tender  Pelvic exam: Vulva;normal Vagina:normal vagina Cervix:Non-tender, Negative CMT, no lesions or redness, 2 cm IUD string noted  A: IUD recheck, doing well  P: Patient really satisfied at this point.  F/U for AEX 1 yr.

## 2013-05-19 ENCOUNTER — Ambulatory Visit (INDEPENDENT_AMBULATORY_CARE_PROVIDER_SITE_OTHER): Payer: Self-pay

## 2013-05-19 DIAGNOSIS — J309 Allergic rhinitis, unspecified: Secondary | ICD-10-CM

## 2013-05-26 ENCOUNTER — Ambulatory Visit (INDEPENDENT_AMBULATORY_CARE_PROVIDER_SITE_OTHER): Payer: Self-pay

## 2013-05-26 DIAGNOSIS — J309 Allergic rhinitis, unspecified: Secondary | ICD-10-CM

## 2013-06-02 ENCOUNTER — Ambulatory Visit (INDEPENDENT_AMBULATORY_CARE_PROVIDER_SITE_OTHER): Payer: Self-pay

## 2013-06-02 DIAGNOSIS — J309 Allergic rhinitis, unspecified: Secondary | ICD-10-CM

## 2013-06-09 ENCOUNTER — Ambulatory Visit (INDEPENDENT_AMBULATORY_CARE_PROVIDER_SITE_OTHER): Payer: Self-pay

## 2013-06-09 DIAGNOSIS — J309 Allergic rhinitis, unspecified: Secondary | ICD-10-CM

## 2013-06-16 ENCOUNTER — Ambulatory Visit: Payer: Self-pay

## 2013-07-07 ENCOUNTER — Ambulatory Visit (INDEPENDENT_AMBULATORY_CARE_PROVIDER_SITE_OTHER): Payer: Self-pay

## 2013-07-07 DIAGNOSIS — J309 Allergic rhinitis, unspecified: Secondary | ICD-10-CM

## 2013-07-14 ENCOUNTER — Ambulatory Visit (INDEPENDENT_AMBULATORY_CARE_PROVIDER_SITE_OTHER): Payer: Self-pay

## 2013-07-14 DIAGNOSIS — J309 Allergic rhinitis, unspecified: Secondary | ICD-10-CM

## 2013-07-20 ENCOUNTER — Ambulatory Visit: Payer: Self-pay

## 2013-07-21 ENCOUNTER — Ambulatory Visit (INDEPENDENT_AMBULATORY_CARE_PROVIDER_SITE_OTHER): Payer: Self-pay

## 2013-07-21 DIAGNOSIS — J309 Allergic rhinitis, unspecified: Secondary | ICD-10-CM

## 2013-07-25 ENCOUNTER — Ambulatory Visit: Payer: Self-pay

## 2013-07-26 ENCOUNTER — Ambulatory Visit (INDEPENDENT_AMBULATORY_CARE_PROVIDER_SITE_OTHER): Payer: Self-pay

## 2013-07-26 DIAGNOSIS — J309 Allergic rhinitis, unspecified: Secondary | ICD-10-CM

## 2013-07-31 ENCOUNTER — Ambulatory Visit: Payer: Self-pay

## 2013-08-04 ENCOUNTER — Ambulatory Visit (INDEPENDENT_AMBULATORY_CARE_PROVIDER_SITE_OTHER): Payer: Self-pay

## 2013-08-04 DIAGNOSIS — J309 Allergic rhinitis, unspecified: Secondary | ICD-10-CM

## 2013-08-11 ENCOUNTER — Ambulatory Visit (INDEPENDENT_AMBULATORY_CARE_PROVIDER_SITE_OTHER): Payer: Self-pay

## 2013-08-11 DIAGNOSIS — J309 Allergic rhinitis, unspecified: Secondary | ICD-10-CM

## 2013-08-18 ENCOUNTER — Ambulatory Visit: Payer: Self-pay

## 2013-08-23 ENCOUNTER — Ambulatory Visit (INDEPENDENT_AMBULATORY_CARE_PROVIDER_SITE_OTHER): Payer: Self-pay

## 2013-08-23 DIAGNOSIS — J309 Allergic rhinitis, unspecified: Secondary | ICD-10-CM

## 2013-08-30 ENCOUNTER — Encounter: Payer: Self-pay | Admitting: Internal Medicine

## 2013-09-01 ENCOUNTER — Ambulatory Visit: Payer: Self-pay

## 2013-09-08 ENCOUNTER — Ambulatory Visit (INDEPENDENT_AMBULATORY_CARE_PROVIDER_SITE_OTHER): Payer: Self-pay

## 2013-09-08 DIAGNOSIS — J309 Allergic rhinitis, unspecified: Secondary | ICD-10-CM

## 2013-09-15 ENCOUNTER — Ambulatory Visit: Payer: Self-pay

## 2013-09-20 ENCOUNTER — Ambulatory Visit (INDEPENDENT_AMBULATORY_CARE_PROVIDER_SITE_OTHER): Payer: Self-pay

## 2013-09-20 DIAGNOSIS — J309 Allergic rhinitis, unspecified: Secondary | ICD-10-CM

## 2013-09-27 ENCOUNTER — Ambulatory Visit (INDEPENDENT_AMBULATORY_CARE_PROVIDER_SITE_OTHER): Payer: Self-pay

## 2013-09-27 DIAGNOSIS — J309 Allergic rhinitis, unspecified: Secondary | ICD-10-CM

## 2013-10-13 ENCOUNTER — Ambulatory Visit (INDEPENDENT_AMBULATORY_CARE_PROVIDER_SITE_OTHER): Payer: Self-pay

## 2013-10-13 DIAGNOSIS — J309 Allergic rhinitis, unspecified: Secondary | ICD-10-CM

## 2013-10-27 ENCOUNTER — Ambulatory Visit (INDEPENDENT_AMBULATORY_CARE_PROVIDER_SITE_OTHER): Payer: Self-pay

## 2013-10-27 DIAGNOSIS — J309 Allergic rhinitis, unspecified: Secondary | ICD-10-CM

## 2013-11-09 ENCOUNTER — Encounter: Payer: Self-pay | Admitting: Nurse Practitioner

## 2013-11-09 ENCOUNTER — Ambulatory Visit (INDEPENDENT_AMBULATORY_CARE_PROVIDER_SITE_OTHER): Payer: Managed Care, Other (non HMO) | Admitting: Nurse Practitioner

## 2013-11-09 VITALS — BP 120/74 | HR 60 | Ht 61.0 in | Wt 227.0 lb

## 2013-11-09 DIAGNOSIS — N939 Abnormal uterine and vaginal bleeding, unspecified: Secondary | ICD-10-CM

## 2013-11-09 DIAGNOSIS — Z0000 Encounter for general adult medical examination without abnormal findings: Secondary | ICD-10-CM

## 2013-11-09 DIAGNOSIS — N926 Irregular menstruation, unspecified: Secondary | ICD-10-CM

## 2013-11-09 DIAGNOSIS — Z01419 Encounter for gynecological examination (general) (routine) without abnormal findings: Secondary | ICD-10-CM

## 2013-11-09 LAB — POCT URINALYSIS DIPSTICK
Bilirubin, UA: NEGATIVE
GLUCOSE UA: NEGATIVE
Ketones, UA: NEGATIVE
Leukocytes, UA: NEGATIVE
NITRITE UA: NEGATIVE
PH UA: 7
PROTEIN UA: NEGATIVE
UROBILINOGEN UA: 1

## 2013-11-09 LAB — HEMOGLOBIN, FINGERSTICK: HEMOGLOBIN, FINGERSTICK: 12.4 g/dL (ref 12.0–16.0)

## 2013-11-09 NOTE — Progress Notes (Signed)
Patient ID: Lacey Lee, female   DOB: 1976-07-16, 39 y.o.   MRN: [id number] 38 y.o. G2P0020 Single African American Fe here for annual exam.  Still has menses last 4 -5 days that is moderate with associated cramps the week prior.  No PMS.  But also has spotting about every day that is light brown and requires mini pad.  Not dating or SA since 2008.  Patient's last menstrual period was 10/24/2013.          Sexually active: no  The current method of family planning is IUD.   Mirena IUD 03/21/13  Exercising: yes  Home exercise routine includes walking or jogging 3 times per week when wather is nice.  Has been about once weekly since weather got colder.. Smoker:  no  Health Maintenance: Pap:  11/05/11, WNL, neg HR HPV TDaP:  2012 Labs:  HB:  Urine:  Trace RBC, urobilinogen 1.0, pH 7.0    reports that she has never smoked. She does not have any smokeless tobacco history on file. She reports that she drinks about 0.5 ounces of alcohol per week. She reports that she does not use illicit drugs.  Past Medical History  Diagnosis Date  . Neurofibromatosis   . Ectopic pregnancy 1999  . Seasonal allergies   . Asthma     albuterol rescue inhaler-uses prn, exacerbated by pet dander  . 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 Brain tumor (benign) found at childhood    non-malignant    Past Surgical History  Procedure Laterality Date  . Fallopian tube removed  in college - 1999  . Wisdom tooth extraction    . Robot assisted myomectomy N/A 12/16/2012    Procedure: ROBOTIC ASSISTED MYOMECTOMY;  Surgeon: Governor Specking, MD;  Location: [facility];  Service: Gynecology;  Laterality: N/A;  . Robotic assisted laparoscopic lysis of adhesion  12/16/2012    Procedure: ROBOTIC ASSISTED LAPAROSCOPIC LYSIS OF ADHESION;  Surgeon: Governor Specking, MD;  Location: [facility];  Service: Gynecology;;  with excision of perotoneal lesions  . Intrauterine device insertion  03/21/13    Mirena    Current  Outpatient Prescriptions  Medication Sig Dispense Refill  . albuterol (PROVENTIL HFA;VENTOLIN HFA) 108 (90 BASE) MCG/ACT inhaler Inhale 2 puffs into the lungs every 6 (six) hours as needed for wheezing.       . cetirizine (ZYRTEC) 10 MG tablet Take 10 mg by mouth daily as needed for allergies.       Marland Kitchen EPIPEN 2-PAK 0.3 MG/0.3ML DEVI as needed (environmental allergies).       Marland Kitchen ibuprofen (ADVIL,MOTRIN) 200 MG tablet Take 800 mg by mouth every 6 (six) hours as needed for pain.      Marland Kitchen levonorgestrel (MIRENA) 20 MCG/24HR IUD 1 each by Intrauterine route once.      . Naproxen Sodium (ALEVE PO) Take by mouth as needed.       . NON FORMULARY Allergy injections once weekly       No current facility-administered medications for this visit.    Family History  Problem Relation Age of Onset  . Allergies Mother     food  . Hypertension Mother   . Hyperlipidemia Mother   . Heart failure Mother   . Thyroid disease Mother   . Diabetes Mother   . Lung cancer Father   . Diabetes Sister   . Hypertension Sister   . Uterine cancer Paternal Grandmother     ROS:  Pertinent  items are noted in HPI.  Otherwise, a comprehensive ROS was negative.  Exam:   BP 120/74  Pulse 60  Ht 5\' 1"  (1.549 m)  Wt 227 lb (102.967 kg)  BMI 42.91 kg/m2  LMP 10/24/2013 Height: 5\' 1"  (154.9 cm)  Ht Readings from Last 3 Encounters:  11/09/13 5\' 1"  (1.549 m)  05/09/13 5\' 1"  (1.549 m)  03/21/13 5' 1.5" (1.562 m)    General appearance: alert, cooperative and appears stated age Head: Normocephalic, without obvious abnormality, atraumatic Neck: no adenopathy, supple, symmetrical, trachea midline and thyroid normal to inspection and palpation Lungs: clear to auscultation bilaterally Breasts: normal appearance, no masses or tenderness Heart: regular rate and rhythm Abdomen: soft, non-tender; no masses,  no organomegaly Extremities: extremities normal, atraumatic, no cyanosis or edema Skin: Skin color, texture, turgor  normal. No rashes or lesions Lymph nodes: Cervical, supraclavicular, and axillary nodes normal. No abnormal inguinal nodes palpated Neurologic: Grossly normal   Pelvic: External genitalia:  no lesions              Urethra:  normal appearing urethra with no masses, tenderness or lesions              Bartholin's and Skene's: normal                 Vagina: normal appearing vagina with normal color and discharge, no lesions              Cervix: anteverted IUD strings are visible              Pap taken: no Bimanual Exam:  Uterus:  normal size, contour, position, consistency, mobility, non-tender limited by body habitus              Adnexa: no mass, fullness, tenderness               Rectovaginal: Confirms               Anus:  normal sphincter tone, no lesions  A:  Well Woman with normal exam  History of numerous UTE fibroids with Myomectomy  History of Menorrhagia with Mirena IUD 02/2013  Continued history of AUB  Obesity  P:   Pap smear as per guidelines   Counseled on breast self exam, mammography screening, adequate intake of calcium and vitamin D, diet and exercise, Kegel's exercises return annually or prn  An After Visit Summary was printed and given to the patient.

## 2013-11-09 NOTE — Patient Instructions (Signed)

## 2013-11-10 ENCOUNTER — Telehealth: Payer: Self-pay | Admitting: Obstetrics & Gynecology

## 2013-11-10 ENCOUNTER — Ambulatory Visit (INDEPENDENT_AMBULATORY_CARE_PROVIDER_SITE_OTHER): Payer: Self-pay

## 2013-11-10 DIAGNOSIS — J309 Allergic rhinitis, unspecified: Secondary | ICD-10-CM

## 2013-11-10 NOTE — Telephone Encounter (Signed)
Telephoned patient/ advised of PR: $50 as quoted by insurance for PUS/ scheduled PUS/ advised patient of cancellation policy and cancellation fee//ssf

## 2013-11-10 NOTE — Progress Notes (Signed)
PUS will be recommended.  Patient will be called with this information and appt scheduled.  Reviewed personally.  Felipa Emory, MD.

## 2013-11-21 ENCOUNTER — Ambulatory Visit (INDEPENDENT_AMBULATORY_CARE_PROVIDER_SITE_OTHER): Payer: Managed Care, Other (non HMO)

## 2013-11-21 ENCOUNTER — Ambulatory Visit (INDEPENDENT_AMBULATORY_CARE_PROVIDER_SITE_OTHER): Payer: Managed Care, Other (non HMO) | Admitting: Obstetrics & Gynecology

## 2013-11-21 VITALS — BP 122/82 | Ht 61.0 in | Wt 227.0 lb

## 2013-11-21 DIAGNOSIS — N926 Irregular menstruation, unspecified: Secondary | ICD-10-CM

## 2013-11-21 DIAGNOSIS — N939 Abnormal uterine and vaginal bleeding, unspecified: Secondary | ICD-10-CM

## 2013-11-21 DIAGNOSIS — D259 Leiomyoma of uterus, unspecified: Secondary | ICD-10-CM

## 2013-11-21 DIAGNOSIS — D219 Benign neoplasm of connective and other soft tissue, unspecified: Secondary | ICD-10-CM

## 2013-11-21 NOTE — Progress Notes (Signed)
38 y.o. G0 Single AA female here for a pelvic ultrasound due to history of fibroids and spotting with IUD.  Mirena placed about 8 months ago by me.  Bleeding is much better--at least 50% reduction but pt still has cycles that are regular and 3-4 days.  Anemia has resolved.  Last hb 12.4.  Until the past month, she spotted almost daily but that has stopped as well.  Hasn't had a cycle this month and is passed the time when it is due.  Just needs some reassurance.  Patient's last menstrual period was 10/24/2013.  Sexually active:  no  Contraception: no method  FINDINGS: UTERUS: 8.1 x 4.2 x 3.4cm with several small fibroids--1.2cm, 0.9cm, 1.0cm, and 0.6cm EMS: 3.33mm with centrally located IUD ADNEXA:   Left ovary 2.8 x 1.2 x 1.3cm   Right ovary 3.3 x 2.0 x 1.6cm CUL DE SAC: no free fluid  Images reviewed with pt.  Fibroids have not changed much since prior u/s about 18 months ago.  Reviewed images of both ultrasounds with patient.  IUD is in correct location.  As spotting has stopped and cycles really are much better, I do not think IUD should be removed.  She is very reassured by this.  She is really hoping that she will be able to conceive and have a child.  Still looking to be married first.  Has questions for me about my pregnancy. Pt reassured.  Assessment:  Uterine fibroids, h/o hysteroscopic myomectomy by Dr. Kerin Perna 2014, h/o menorrhagia much improved with IUD Plan: Continue IUD use and follow up for AEX in one year.  ~15 minutes spent with patient >50% of time was in face to face discussion of above.

## 2013-11-22 ENCOUNTER — Encounter: Payer: Self-pay | Admitting: Obstetrics & Gynecology

## 2013-11-22 DIAGNOSIS — D219 Benign neoplasm of connective and other soft tissue, unspecified: Secondary | ICD-10-CM | POA: Insufficient documentation

## 2013-11-24 ENCOUNTER — Ambulatory Visit (INDEPENDENT_AMBULATORY_CARE_PROVIDER_SITE_OTHER): Payer: Self-pay

## 2013-11-24 DIAGNOSIS — J309 Allergic rhinitis, unspecified: Secondary | ICD-10-CM

## 2013-12-08 ENCOUNTER — Ambulatory Visit (INDEPENDENT_AMBULATORY_CARE_PROVIDER_SITE_OTHER): Payer: Self-pay

## 2013-12-08 DIAGNOSIS — J309 Allergic rhinitis, unspecified: Secondary | ICD-10-CM

## 2013-12-22 ENCOUNTER — Ambulatory Visit (INDEPENDENT_AMBULATORY_CARE_PROVIDER_SITE_OTHER): Payer: Self-pay

## 2013-12-22 DIAGNOSIS — J309 Allergic rhinitis, unspecified: Secondary | ICD-10-CM

## 2013-12-27 ENCOUNTER — Ambulatory Visit: Payer: Self-pay

## 2013-12-28 ENCOUNTER — Ambulatory Visit: Payer: Self-pay

## 2014-01-11 ENCOUNTER — Other Ambulatory Visit: Payer: Self-pay

## 2014-01-19 ENCOUNTER — Ambulatory Visit (INDEPENDENT_AMBULATORY_CARE_PROVIDER_SITE_OTHER): Payer: Self-pay

## 2014-01-19 DIAGNOSIS — J309 Allergic rhinitis, unspecified: Secondary | ICD-10-CM

## 2014-01-26 ENCOUNTER — Ambulatory Visit (INDEPENDENT_AMBULATORY_CARE_PROVIDER_SITE_OTHER): Payer: Self-pay

## 2014-01-26 DIAGNOSIS — J309 Allergic rhinitis, unspecified: Secondary | ICD-10-CM

## 2014-02-02 ENCOUNTER — Ambulatory Visit (INDEPENDENT_AMBULATORY_CARE_PROVIDER_SITE_OTHER): Payer: Self-pay

## 2014-02-02 DIAGNOSIS — J309 Allergic rhinitis, unspecified: Secondary | ICD-10-CM

## 2014-02-09 ENCOUNTER — Ambulatory Visit (INDEPENDENT_AMBULATORY_CARE_PROVIDER_SITE_OTHER): Payer: Self-pay

## 2014-02-09 DIAGNOSIS — J309 Allergic rhinitis, unspecified: Secondary | ICD-10-CM

## 2014-02-16 ENCOUNTER — Ambulatory Visit: Payer: Self-pay

## 2014-02-23 ENCOUNTER — Ambulatory Visit (INDEPENDENT_AMBULATORY_CARE_PROVIDER_SITE_OTHER): Payer: Self-pay

## 2014-02-23 DIAGNOSIS — J309 Allergic rhinitis, unspecified: Secondary | ICD-10-CM

## 2014-03-09 ENCOUNTER — Ambulatory Visit (INDEPENDENT_AMBULATORY_CARE_PROVIDER_SITE_OTHER): Payer: BC Managed Care – PPO

## 2014-03-09 DIAGNOSIS — J309 Allergic rhinitis, unspecified: Secondary | ICD-10-CM

## 2014-03-23 ENCOUNTER — Ambulatory Visit (INDEPENDENT_AMBULATORY_CARE_PROVIDER_SITE_OTHER): Payer: BC Managed Care – PPO

## 2014-03-23 DIAGNOSIS — J309 Allergic rhinitis, unspecified: Secondary | ICD-10-CM

## 2014-04-06 ENCOUNTER — Ambulatory Visit (INDEPENDENT_AMBULATORY_CARE_PROVIDER_SITE_OTHER): Payer: BC Managed Care – PPO

## 2014-04-06 DIAGNOSIS — J309 Allergic rhinitis, unspecified: Secondary | ICD-10-CM

## 2014-04-20 ENCOUNTER — Ambulatory Visit (INDEPENDENT_AMBULATORY_CARE_PROVIDER_SITE_OTHER): Payer: BC Managed Care – PPO

## 2014-04-20 DIAGNOSIS — J309 Allergic rhinitis, unspecified: Secondary | ICD-10-CM

## 2014-05-04 ENCOUNTER — Ambulatory Visit: Payer: BC Managed Care – PPO

## 2014-05-25 ENCOUNTER — Ambulatory Visit (INDEPENDENT_AMBULATORY_CARE_PROVIDER_SITE_OTHER): Payer: BC Managed Care – PPO

## 2014-05-25 DIAGNOSIS — J309 Allergic rhinitis, unspecified: Secondary | ICD-10-CM

## 2014-05-29 ENCOUNTER — Encounter: Payer: Self-pay | Admitting: Internal Medicine

## 2014-06-01 ENCOUNTER — Ambulatory Visit (INDEPENDENT_AMBULATORY_CARE_PROVIDER_SITE_OTHER): Payer: BC Managed Care – PPO

## 2014-06-01 DIAGNOSIS — J309 Allergic rhinitis, unspecified: Secondary | ICD-10-CM

## 2014-06-06 ENCOUNTER — Ambulatory Visit: Payer: BC Managed Care – PPO

## 2014-06-07 ENCOUNTER — Ambulatory Visit (INDEPENDENT_AMBULATORY_CARE_PROVIDER_SITE_OTHER): Payer: BC Managed Care – PPO

## 2014-06-07 DIAGNOSIS — J309 Allergic rhinitis, unspecified: Secondary | ICD-10-CM

## 2014-06-15 ENCOUNTER — Ambulatory Visit (INDEPENDENT_AMBULATORY_CARE_PROVIDER_SITE_OTHER): Payer: BC Managed Care – PPO

## 2014-06-15 DIAGNOSIS — J309 Allergic rhinitis, unspecified: Secondary | ICD-10-CM

## 2014-06-22 ENCOUNTER — Ambulatory Visit (INDEPENDENT_AMBULATORY_CARE_PROVIDER_SITE_OTHER): Payer: BC Managed Care – PPO

## 2014-06-22 DIAGNOSIS — J309 Allergic rhinitis, unspecified: Secondary | ICD-10-CM

## 2014-06-29 ENCOUNTER — Ambulatory Visit (INDEPENDENT_AMBULATORY_CARE_PROVIDER_SITE_OTHER): Payer: BC Managed Care – PPO

## 2014-06-29 DIAGNOSIS — J309 Allergic rhinitis, unspecified: Secondary | ICD-10-CM

## 2014-07-06 ENCOUNTER — Ambulatory Visit (INDEPENDENT_AMBULATORY_CARE_PROVIDER_SITE_OTHER): Payer: BC Managed Care – PPO

## 2014-07-06 DIAGNOSIS — J309 Allergic rhinitis, unspecified: Secondary | ICD-10-CM

## 2014-07-13 ENCOUNTER — Ambulatory Visit (INDEPENDENT_AMBULATORY_CARE_PROVIDER_SITE_OTHER): Payer: BC Managed Care – PPO

## 2014-07-13 DIAGNOSIS — J309 Allergic rhinitis, unspecified: Secondary | ICD-10-CM

## 2014-07-20 ENCOUNTER — Ambulatory Visit (INDEPENDENT_AMBULATORY_CARE_PROVIDER_SITE_OTHER): Payer: BC Managed Care – PPO

## 2014-07-20 DIAGNOSIS — J309 Allergic rhinitis, unspecified: Secondary | ICD-10-CM

## 2014-07-27 ENCOUNTER — Ambulatory Visit: Payer: BC Managed Care – PPO

## 2014-07-30 ENCOUNTER — Encounter: Payer: Self-pay | Admitting: Obstetrics & Gynecology

## 2014-08-03 ENCOUNTER — Ambulatory Visit (INDEPENDENT_AMBULATORY_CARE_PROVIDER_SITE_OTHER): Payer: BC Managed Care – PPO

## 2014-08-03 DIAGNOSIS — J309 Allergic rhinitis, unspecified: Secondary | ICD-10-CM

## 2014-08-06 ENCOUNTER — Telehealth: Payer: Self-pay | Admitting: Internal Medicine

## 2014-08-06 NOTE — Telephone Encounter (Signed)
Spoke with pt, states she's had intermittent face swelling Xcouple of months.  Dr. Carmelina Peal has been monitoring this for her, was told by him to let him know when this happens again to have labs drawn.  She has contacted Dr. Leana Gamer office, but he is in the [place] office today, so she is waiting to see what labs she needs to have drawn.  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  Pt states that she has had sinus swelling and some tongue swelling since yesterday but her breathing is not obstructed, and has taken a Benadryl for this, which has brought down the swelling.  I advised her to go to the ED if this worsens at all.  Dr. Annamaria Boots, are you ok with her having the labs drawn here at he [place] office?  Thanks!  Current Outpatient Prescriptions on File Prior to Visit  Medication Sig Dispense Refill  . albuterol (PROVENTIL HFA;VENTOLIN HFA) 108 (90 BASE) MCG/ACT inhaler Inhale 2 puffs into the lungs every 6 (six) hours as needed for wheezing.     . cetirizine (ZYRTEC) 10 MG tablet Take 10 mg by mouth daily as needed for allergies.     Marland Kitchen EPIPEN 2-PAK 0.3 MG/0.3ML DEVI as needed (environmental allergies).     Marland Kitchen ibuprofen (ADVIL,MOTRIN) 200 MG tablet Take 800 mg by mouth every 6 (six) hours as needed for pain.    Marland Kitchen levonorgestrel (MIRENA) 20 MCG/24HR IUD 1 each by Intrauterine route once.    . Naproxen Sodium (ALEVE PO) Take by mouth as needed.     . NON FORMULARY Allergy injections once weekly     No current facility-administered medications on file prior to visit.   Allergies  Allergen Reactions  . Latex

## 2014-08-06 NOTE — Telephone Encounter (Signed)
Spoke with the pt and notified of recs per CDY  She verbalized understanding  She will call us tomorrow to inform us what labs are needed

## 2014-08-06 NOTE — Telephone Encounter (Signed)
LMTCB

## 2014-08-06 NOTE — Telephone Encounter (Signed)
If we know what labs Dr Neldon Mc wants, then ok to order for dx angioedema, with request that copy of results be sent to Dr Neldon Mc.

## 2014-08-06 NOTE — Telephone Encounter (Signed)
Pt returned call.  Holly D Pryor ° °

## 2014-08-07 ENCOUNTER — Other Ambulatory Visit: Payer: BC Managed Care – PPO

## 2014-08-07 ENCOUNTER — Telehealth: Payer: Self-pay | Admitting: Internal Medicine

## 2014-08-07 ENCOUNTER — Other Ambulatory Visit: Payer: Self-pay | Admitting: Internal Medicine

## 2014-08-07 DIAGNOSIS — J4520 Mild intermittent asthma, uncomplicated: Secondary | ICD-10-CM

## 2014-08-07 DIAGNOSIS — J3089 Other allergic rhinitis: Principal | ICD-10-CM

## 2014-08-07 DIAGNOSIS — J302 Other seasonal allergic rhinitis: Secondary | ICD-10-CM

## 2014-08-07 DIAGNOSIS — D259 Leiomyoma of uterus, unspecified: Secondary | ICD-10-CM

## 2014-08-07 NOTE — Telephone Encounter (Signed)
Will have to hold this message for Lacey Lee in the morning.  Lacey Lee pt wanted to make sure that all of her labs go to one lab--either [facility] or [facility].  The ANA reflex to ENA 9 panel and the alpha gal do not have an option in the epic system to choose [facility].  Papers that the pt dropped off are on [employer].

## 2014-08-08 ENCOUNTER — Other Ambulatory Visit: Payer: BC Managed Care – PPO

## 2014-08-08 DIAGNOSIS — J3089 Other allergic rhinitis: Principal | ICD-10-CM

## 2014-08-08 DIAGNOSIS — D259 Leiomyoma of uterus, unspecified: Secondary | ICD-10-CM

## 2014-08-08 DIAGNOSIS — J4520 Mild intermittent asthma, uncomplicated: Secondary | ICD-10-CM

## 2014-08-08 DIAGNOSIS — J302 Other seasonal allergic rhinitis: Secondary | ICD-10-CM

## 2014-08-08 LAB — COMPREHENSIVE METABOLIC PANEL
ALBUMIN: 3.9 g/dL (ref 3.5–5.2)
ALK PHOS: 105 U/L (ref 39–117)
ALT: 15 U/L (ref 0–35)
AST: 16 U/L (ref 0–37)
BUN: 13 mg/dL (ref 6–23)
CO2: 24 mEq/L (ref 19–32)
Calcium: 8.8 mg/dL (ref 8.4–10.5)
Chloride: 105 mEq/L (ref 96–112)
Creat: 0.54 mg/dL (ref 0.50–1.10)
GLUCOSE: 79 mg/dL (ref 70–99)
POTASSIUM: 4.4 meq/L (ref 3.5–5.3)
Sodium: 136 mEq/L (ref 135–145)
Total Bilirubin: 0.6 mg/dL (ref 0.2–1.2)
Total Protein: 6.6 g/dL (ref 6.0–8.3)

## 2014-08-08 LAB — CBC WITH DIFFERENTIAL/PLATELET
Basophils Absolute: 0 10*3/uL (ref 0.0–0.1)
Basophils Relative: 0 % (ref 0–1)
Eosinophils Absolute: 0.1 10*3/uL (ref 0.0–0.7)
Eosinophils Relative: 2 % (ref 0–5)
HCT: 36.5 % (ref 36.0–46.0)
Hemoglobin: 12.2 g/dL (ref 12.0–15.0)
Lymphocytes Relative: 42 % (ref 12–46)
Lymphs Abs: 2.3 10*3/uL (ref 0.7–4.0)
MCH: 28.6 pg (ref 26.0–34.0)
MCHC: 33.4 g/dL (ref 30.0–36.0)
MCV: 85.7 fL (ref 78.0–100.0)
Monocytes Absolute: 0.4 10*3/uL (ref 0.1–1.0)
Monocytes Relative: 7 % (ref 3–12)
Neutro Abs: 2.7 10*3/uL (ref 1.7–7.7)
Neutrophils Relative %: 49 % (ref 43–77)
Platelets: 235 10*3/uL (ref 150–400)
RBC: 4.26 MIL/uL (ref 3.87–5.11)
RDW: 14.7 % (ref 11.5–15.5)
WBC: 5.5 10*3/uL (ref 4.0–10.5)

## 2014-08-08 LAB — T4: T4, Total: 5.8 ug/dL (ref 4.5–12.0)

## 2014-08-08 LAB — TSH: TSH: 1.615 u[IU]/mL (ref 0.350–4.500)

## 2014-08-08 NOTE — Telephone Encounter (Signed)
Spoke with patient-states she needs labs in computer for everything to go through [place]. Pt and Blanch Media in lab aware that ALL labs are in [facility] and states to send results to [employer] number and phone number to [place] office in comments section of lab orders. Nothing more needed at this time.

## 2014-08-09 LAB — ANA REFLEX TO ENA 9PANEL: ANA: NEGATIVE

## 2014-08-09 LAB — THYROID PEROXIDASE ANTIBODY: Thyroperoxidase Ab SerPl-aCnc: 3 IU/mL (ref <9)

## 2014-08-09 LAB — SEDIMENTATION RATE: SED RATE: 12 mm/h (ref 0–22)

## 2014-08-09 LAB — C1 ESTERASE INHIBITOR, FUNCTIONAL

## 2014-08-10 ENCOUNTER — Ambulatory Visit (INDEPENDENT_AMBULATORY_CARE_PROVIDER_SITE_OTHER): Payer: BC Managed Care – PPO

## 2014-08-10 DIAGNOSIS — J309 Allergic rhinitis, unspecified: Secondary | ICD-10-CM

## 2014-08-11 LAB — GALACTOSE-ALPHA-1,3-GALACTOSE IGE: GALACTOSE-ALPHA-1, 3-GALACTOSE IGE: 0.67 kU/L — AB (ref <0.35)

## 2014-08-17 ENCOUNTER — Ambulatory Visit (INDEPENDENT_AMBULATORY_CARE_PROVIDER_SITE_OTHER): Payer: BC Managed Care – PPO

## 2014-08-17 DIAGNOSIS — J309 Allergic rhinitis, unspecified: Secondary | ICD-10-CM

## 2014-08-22 ENCOUNTER — Ambulatory Visit (INDEPENDENT_AMBULATORY_CARE_PROVIDER_SITE_OTHER): Payer: BC Managed Care – PPO

## 2014-08-22 DIAGNOSIS — J309 Allergic rhinitis, unspecified: Secondary | ICD-10-CM

## 2014-08-31 ENCOUNTER — Ambulatory Visit (INDEPENDENT_AMBULATORY_CARE_PROVIDER_SITE_OTHER): Payer: BC Managed Care – PPO

## 2014-08-31 DIAGNOSIS — J309 Allergic rhinitis, unspecified: Secondary | ICD-10-CM

## 2014-09-05 ENCOUNTER — Ambulatory Visit: Payer: BC Managed Care – PPO

## 2014-09-07 ENCOUNTER — Ambulatory Visit (INDEPENDENT_AMBULATORY_CARE_PROVIDER_SITE_OTHER): Payer: BC Managed Care – PPO

## 2014-09-07 DIAGNOSIS — J309 Allergic rhinitis, unspecified: Secondary | ICD-10-CM

## 2014-09-14 ENCOUNTER — Ambulatory Visit (INDEPENDENT_AMBULATORY_CARE_PROVIDER_SITE_OTHER): Payer: BC Managed Care – PPO

## 2014-09-14 DIAGNOSIS — J309 Allergic rhinitis, unspecified: Secondary | ICD-10-CM

## 2014-09-14 NOTE — Progress Notes (Signed)
Quick Note:  lmtcb X1 to relay results/recs ______ 

## 2014-09-17 ENCOUNTER — Telehealth: Payer: Self-pay | Admitting: Internal Medicine

## 2014-09-17 NOTE — Progress Notes (Signed)
Quick Note:  Pt aware of results. ______ 

## 2014-09-17 NOTE — Telephone Encounter (Signed)
Called and spoke to pt. Pt questioning where this allergy could have come from or what could have caused the allergy. Pt aware we will contact her back once Dr. Annamaria Boots responds.

## 2014-09-17 NOTE — Telephone Encounter (Signed)
Pt aware of rec's  Pt states that she has been staying away from pork, lamb and beef since Thanksgiving and she has noticed a big difference in her allergies. Pt states that she will continue to stay away for a little while longer.  Pt asking if the tick bite would have to be something recent or if a tick bite in general at some point in her life would have caused this?? Pt states that she does not recall being bitten by a tick anytime recently.   CY -please advise. Thanks.

## 2014-09-17 NOTE — Telephone Encounter (Signed)
Notes Recorded by Deneise Lever, MD on 09/09/2014 at 6:55 PM Labs negative for clues to allergy problem, except a test that can indicate allergy to beef, pork and lamb. Suggest try just eating seafood and chicken for a month if possible, to see if that makes a difference.  ----------------------------------------------------------------------------------  Pt states that Dr. Zoila Shutter office informed her the day after after thanksgiving, has been adhering to this diet since then.   Pt wants to know if this is something she can grow out of?  Thanks!

## 2014-09-17 NOTE — Telephone Encounter (Signed)
The pattern of meat allergy antibodies that she has is triggered by a tick bite- becoming sensitized to something in the tick's saliva that crosses over to make people sensitive to certain meats.  We have only shown that she has antibodies to these meats. It is harder to prove that this really causes her allergy reaction. All we can do is watch to see if she gets a lot better if she avoids the beef, lamb and pork.

## 2014-09-17 NOTE — Telephone Encounter (Signed)
Pt wants to know if you can tell where the allergy started from - ph # ext [phone]

## 2014-09-17 NOTE — Telephone Encounter (Signed)
Tick bite could have been in last few years.

## 2014-09-17 NOTE — Telephone Encounter (Signed)
LM for [employer]

## 2014-09-18 NOTE — Telephone Encounter (Signed)
lmomtcb  X 2 

## 2014-09-19 ENCOUNTER — Ambulatory Visit (INDEPENDENT_AMBULATORY_CARE_PROVIDER_SITE_OTHER): Payer: BC Managed Care – PPO

## 2014-09-19 DIAGNOSIS — J309 Allergic rhinitis, unspecified: Secondary | ICD-10-CM

## 2014-09-19 NOTE — Telephone Encounter (Signed)
Spoke with pt, aware of rec's per CY. Nothing further needed.

## 2014-09-26 ENCOUNTER — Ambulatory Visit (INDEPENDENT_AMBULATORY_CARE_PROVIDER_SITE_OTHER): Payer: BC Managed Care – PPO

## 2014-09-26 DIAGNOSIS — J309 Allergic rhinitis, unspecified: Secondary | ICD-10-CM

## 2014-10-05 ENCOUNTER — Ambulatory Visit: Payer: BC Managed Care – PPO

## 2014-10-12 ENCOUNTER — Ambulatory Visit (INDEPENDENT_AMBULATORY_CARE_PROVIDER_SITE_OTHER): Payer: BLUE CROSS/BLUE SHIELD

## 2014-10-12 DIAGNOSIS — J309 Allergic rhinitis, unspecified: Secondary | ICD-10-CM

## 2014-10-18 ENCOUNTER — Ambulatory Visit (INDEPENDENT_AMBULATORY_CARE_PROVIDER_SITE_OTHER): Payer: BLUE CROSS/BLUE SHIELD

## 2014-10-18 DIAGNOSIS — J309 Allergic rhinitis, unspecified: Secondary | ICD-10-CM | POA: Diagnosis not present

## 2014-10-19 ENCOUNTER — Ambulatory Visit: Payer: Self-pay

## 2014-10-26 ENCOUNTER — Ambulatory Visit (INDEPENDENT_AMBULATORY_CARE_PROVIDER_SITE_OTHER): Payer: BLUE CROSS/BLUE SHIELD

## 2014-10-26 DIAGNOSIS — J309 Allergic rhinitis, unspecified: Secondary | ICD-10-CM

## 2014-11-02 ENCOUNTER — Ambulatory Visit (INDEPENDENT_AMBULATORY_CARE_PROVIDER_SITE_OTHER): Payer: BLUE CROSS/BLUE SHIELD

## 2014-11-02 DIAGNOSIS — J309 Allergic rhinitis, unspecified: Secondary | ICD-10-CM | POA: Diagnosis not present

## 2014-11-06 ENCOUNTER — Encounter: Payer: Self-pay | Admitting: Internal Medicine

## 2014-11-09 ENCOUNTER — Ambulatory Visit (INDEPENDENT_AMBULATORY_CARE_PROVIDER_SITE_OTHER): Payer: BLUE CROSS/BLUE SHIELD

## 2014-11-09 DIAGNOSIS — J309 Allergic rhinitis, unspecified: Secondary | ICD-10-CM

## 2014-11-13 ENCOUNTER — Encounter: Payer: Self-pay | Admitting: Nurse Practitioner

## 2014-11-13 ENCOUNTER — Ambulatory Visit (INDEPENDENT_AMBULATORY_CARE_PROVIDER_SITE_OTHER): Payer: BLUE CROSS/BLUE SHIELD | Admitting: Nurse Practitioner

## 2014-11-13 VITALS — BP 116/60 | HR 88 | Resp 16 | Ht 61.0 in | Wt 215.4 lb

## 2014-11-13 DIAGNOSIS — D259 Leiomyoma of uterus, unspecified: Secondary | ICD-10-CM

## 2014-11-13 DIAGNOSIS — Z01419 Encounter for gynecological examination (general) (routine) without abnormal findings: Secondary | ICD-10-CM

## 2014-11-13 DIAGNOSIS — N946 Dysmenorrhea, unspecified: Secondary | ICD-10-CM

## 2014-11-13 DIAGNOSIS — Z0000 Encounter for general adult medical examination without abnormal findings: Secondary | ICD-10-CM

## 2014-11-13 DIAGNOSIS — N809 Endometriosis, unspecified: Secondary | ICD-10-CM

## 2014-11-13 LAB — HEMOGLOBIN, FINGERSTICK: Hemoglobin, fingerstick: 12.2 g/dL (ref 12.0–16.0)

## 2014-11-13 LAB — POCT URINALYSIS DIPSTICK
Bilirubin, UA: NEGATIVE
GLUCOSE UA: NEGATIVE
KETONES UA: NEGATIVE
Leukocytes, UA: NEGATIVE
Nitrite, UA: NEGATIVE
Urobilinogen, UA: NEGATIVE
pH, UA: 5

## 2014-11-13 NOTE — Progress Notes (Signed)
39 y.o. [id number] Single  African American Fe here for annual exam.  Menses for 4-5 days.  Cramps 1 week before that at times are 'horrible'.  Heavier 3-4 days with still a lot of clots. Not dating or SA.  Last PUS 2013.  Had Mirena inserted 02/2013 in hopes of slowing her cycles.  Patient's last menstrual period was 11/04/2014.          Sexually active: No.  The current method of family planning is IUD. 03/21/13 Mirena IUD   Exercising: Yes.    gym Smoker:  no  Health Maintenance: Pap:  11/05/11 WNL/negative HR HPV MMG:  none TDaP:  2012/2013 with Dr Alroy Dust Labs: HGB-12.2,  URINE-RBC-trace - menses, PROTEIN-trace   reports that she has never smoked. She has never used smokeless tobacco. She reports that she drinks alcohol. She reports that she does not use illicit drugs.  Past Medical History  Diagnosis Date  . Neurofibromatosis   . Ectopic pregnancy 1999  . Seasonal allergies   . Asthma     albuterol rescue inhaler-uses prn, exacerbated by pet dander  . 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 Brain tumor (benign) found at childhood    non-malignant    Past Surgical History  Procedure Laterality Date  . Fallopian tube removed  in college - 1999  . Wisdom tooth extraction    . Robot assisted myomectomy N/A 12/16/2012    Procedure: ROBOTIC ASSISTED MYOMECTOMY;  Surgeon: Governor Specking, MD;  Location: [facility];  Service: Gynecology;  Laterality: N/A;  . Robotic assisted laparoscopic lysis of adhesion  12/16/2012    Procedure: ROBOTIC ASSISTED LAPAROSCOPIC LYSIS OF ADHESION;  Surgeon: Governor Specking, MD;  Location: [facility];  Service: Gynecology;;  with excision of perotoneal lesions  . Intrauterine device insertion  03/21/13    Mirena    Current Outpatient Prescriptions  Medication Sig Dispense Refill  . albuterol (PROVENTIL HFA;VENTOLIN HFA) 108 (90 BASE) MCG/ACT inhaler Inhale 2 puffs into the lungs every 6 (six) hours as needed for wheezing.     . Biotin 5000  MCG CAPS Take by mouth daily.    . cetirizine (ZYRTEC) 10 MG tablet Take 10 mg by mouth daily as needed for allergies.     Marland Kitchen EPIPEN 2-PAK 0.3 MG/0.3ML DEVI as needed (environmental allergies).     Marland Kitchen ibuprofen (ADVIL,MOTRIN) 200 MG tablet Take 800 mg by mouth every 6 (six) hours as needed for pain.    Marland Kitchen levonorgestrel (MIRENA) 20 MCG/24HR IUD 1 each by Intrauterine route once.    . Naproxen Sodium (ALEVE PO) Take by mouth as needed.     . NON FORMULARY Allergy injections once weekly    . Omega-3 Fatty Acids (FISH OIL) 1200 MG CAPS Take by mouth daily.     No current facility-administered medications for this visit.    Family History  Problem Relation Age of Onset  . Allergies Mother     food  . Hypertension Mother   . Hyperlipidemia Mother   . Heart failure Mother   . Thyroid disease Mother   . Diabetes Mother   . Lung cancer Father   . Diabetes Sister   . Hypertension Sister   . Uterine cancer Paternal Grandmother     ROS:  Pertinent items are noted in HPI.  Otherwise, a comprehensive ROS was negative.  Exam:   BP 116/60 mmHg  Pulse 88  Resp 16  Ht 5\' 1"  (1.549 m)  Wt 215  lb 6.4 oz (97.705 kg)  BMI 40.72 kg/m2  LMP 11/04/2014 Height: 5\' 1"  (154.9 cm) Ht Readings from Last 3 Encounters:  11/13/14 5\' 1"  (1.549 m)  11/21/13 5\' 1"  (1.549 m)  11/09/13 5\' 1"  (1.549 m)    General appearance: alert, cooperative and appears stated age Head: Normocephalic, without obvious abnormality, atraumatic Neck: no adenopathy, supple, symmetrical, trachea midline and thyroid normal to inspection and palpation Lungs: clear to auscultation bilaterally Breasts: normal appearance, no masses or tenderness Heart: regular rate and rhythm Abdomen: soft, non-tender; no masses,  no organomegaly Extremities: extremities normal, atraumatic, no cyanosis or edema Skin: Skin color, texture, turgor normal. No rashes or lesions Lymph nodes: Cervical, supraclavicular, and axillary nodes normal. No  abnormal inguinal nodes palpated Neurologic: Grossly normal   Pelvic: External genitalia:  no lesions              Urethra:  normal appearing urethra with no masses, tenderness or lesions              Bartholin's and Skene's: normal                 Vagina: normal appearing vagina with normal color and moderate menses  discharge, no lesions              Cervix: anteverted IUD strings are visible.              Pap taken: Yes.   Bimanual Exam:  Uterus:  enlarged, 6-8 weeks size, also pain with manipulation of uterus secondary to endometriosis              Adnexa: no mass, fullness, tenderness               Rectovaginal: Confirms               Anus:  normal sphincter tone, no lesions  Chaperone present: Yes  A:  Well Woman with normal exam  History of numerous UTE fibroids with Myomectomy and LOA 12/16/12 History of Menorrhagia with Mirena IUD 02/2013 Endometriosis with dysmenorrhea Obesity - BMI 40.70    P:   Reviewed health and wellness pertinent to exam  Pap smear taken today  Mammogram is due age 73  Declines other surgical options at this time - when IUD is removed next July - may consider another PUS/SHGM and re-evaluate  Continue to monitor menses and to let us know if prolonged or heavy bleeding  Counseled on breast self exam, adequate intake of calcium and vitamin D, diet and exercise return annually or prn  An After Visit Summary was printed and given to the patient.

## 2014-11-13 NOTE — Patient Instructions (Signed)

## 2014-11-15 LAB — IPS PAP TEST WITH HPV

## 2014-11-16 ENCOUNTER — Ambulatory Visit (INDEPENDENT_AMBULATORY_CARE_PROVIDER_SITE_OTHER): Payer: BLUE CROSS/BLUE SHIELD

## 2014-11-16 DIAGNOSIS — J309 Allergic rhinitis, unspecified: Secondary | ICD-10-CM

## 2014-11-18 NOTE — Progress Notes (Signed)
Reviewed personally.  M. [name] Harout Scheurich, MD.  

## 2014-11-23 ENCOUNTER — Ambulatory Visit: Payer: Self-pay

## 2014-11-30 ENCOUNTER — Ambulatory Visit (INDEPENDENT_AMBULATORY_CARE_PROVIDER_SITE_OTHER): Payer: BLUE CROSS/BLUE SHIELD

## 2014-11-30 DIAGNOSIS — J309 Allergic rhinitis, unspecified: Secondary | ICD-10-CM

## 2014-12-07 ENCOUNTER — Ambulatory Visit (INDEPENDENT_AMBULATORY_CARE_PROVIDER_SITE_OTHER): Payer: BLUE CROSS/BLUE SHIELD

## 2014-12-07 DIAGNOSIS — J309 Allergic rhinitis, unspecified: Secondary | ICD-10-CM

## 2014-12-14 ENCOUNTER — Ambulatory Visit: Payer: Self-pay

## 2014-12-17 ENCOUNTER — Ambulatory Visit (INDEPENDENT_AMBULATORY_CARE_PROVIDER_SITE_OTHER): Payer: BLUE CROSS/BLUE SHIELD

## 2014-12-17 DIAGNOSIS — J309 Allergic rhinitis, unspecified: Secondary | ICD-10-CM | POA: Diagnosis not present

## 2014-12-24 ENCOUNTER — Ambulatory Visit (INDEPENDENT_AMBULATORY_CARE_PROVIDER_SITE_OTHER): Payer: BLUE CROSS/BLUE SHIELD

## 2014-12-24 DIAGNOSIS — J309 Allergic rhinitis, unspecified: Secondary | ICD-10-CM

## 2014-12-31 ENCOUNTER — Ambulatory Visit (INDEPENDENT_AMBULATORY_CARE_PROVIDER_SITE_OTHER): Payer: BLUE CROSS/BLUE SHIELD

## 2014-12-31 DIAGNOSIS — J309 Allergic rhinitis, unspecified: Secondary | ICD-10-CM | POA: Diagnosis not present

## 2015-01-07 ENCOUNTER — Ambulatory Visit (INDEPENDENT_AMBULATORY_CARE_PROVIDER_SITE_OTHER): Payer: BLUE CROSS/BLUE SHIELD

## 2015-01-07 DIAGNOSIS — J309 Allergic rhinitis, unspecified: Secondary | ICD-10-CM | POA: Diagnosis not present

## 2015-01-14 ENCOUNTER — Ambulatory Visit (INDEPENDENT_AMBULATORY_CARE_PROVIDER_SITE_OTHER): Payer: BLUE CROSS/BLUE SHIELD

## 2015-01-14 DIAGNOSIS — J309 Allergic rhinitis, unspecified: Secondary | ICD-10-CM

## 2015-01-21 ENCOUNTER — Encounter: Payer: Self-pay | Admitting: Internal Medicine

## 2015-01-21 ENCOUNTER — Ambulatory Visit (INDEPENDENT_AMBULATORY_CARE_PROVIDER_SITE_OTHER): Payer: BLUE CROSS/BLUE SHIELD

## 2015-01-21 DIAGNOSIS — J309 Allergic rhinitis, unspecified: Secondary | ICD-10-CM

## 2015-01-28 ENCOUNTER — Ambulatory Visit (INDEPENDENT_AMBULATORY_CARE_PROVIDER_SITE_OTHER): Payer: BLUE CROSS/BLUE SHIELD

## 2015-01-28 DIAGNOSIS — J309 Allergic rhinitis, unspecified: Secondary | ICD-10-CM

## 2015-02-04 ENCOUNTER — Ambulatory Visit (INDEPENDENT_AMBULATORY_CARE_PROVIDER_SITE_OTHER): Payer: BLUE CROSS/BLUE SHIELD

## 2015-02-04 DIAGNOSIS — J309 Allergic rhinitis, unspecified: Secondary | ICD-10-CM

## 2015-02-11 ENCOUNTER — Ambulatory Visit (INDEPENDENT_AMBULATORY_CARE_PROVIDER_SITE_OTHER): Payer: BLUE CROSS/BLUE SHIELD

## 2015-02-11 DIAGNOSIS — J309 Allergic rhinitis, unspecified: Secondary | ICD-10-CM

## 2015-02-18 ENCOUNTER — Ambulatory Visit: Payer: Self-pay

## 2015-02-27 ENCOUNTER — Ambulatory Visit (INDEPENDENT_AMBULATORY_CARE_PROVIDER_SITE_OTHER): Payer: BLUE CROSS/BLUE SHIELD

## 2015-02-27 DIAGNOSIS — J309 Allergic rhinitis, unspecified: Secondary | ICD-10-CM

## 2015-03-04 ENCOUNTER — Ambulatory Visit (INDEPENDENT_AMBULATORY_CARE_PROVIDER_SITE_OTHER): Payer: BLUE CROSS/BLUE SHIELD

## 2015-03-04 DIAGNOSIS — J309 Allergic rhinitis, unspecified: Secondary | ICD-10-CM | POA: Diagnosis not present

## 2015-03-11 ENCOUNTER — Ambulatory Visit: Payer: BLUE CROSS/BLUE SHIELD

## 2015-03-25 ENCOUNTER — Ambulatory Visit (INDEPENDENT_AMBULATORY_CARE_PROVIDER_SITE_OTHER): Payer: BLUE CROSS/BLUE SHIELD

## 2015-03-25 DIAGNOSIS — J309 Allergic rhinitis, unspecified: Secondary | ICD-10-CM

## 2015-04-03 ENCOUNTER — Ambulatory Visit (INDEPENDENT_AMBULATORY_CARE_PROVIDER_SITE_OTHER): Payer: BLUE CROSS/BLUE SHIELD

## 2015-04-03 DIAGNOSIS — J309 Allergic rhinitis, unspecified: Secondary | ICD-10-CM

## 2015-04-08 ENCOUNTER — Ambulatory Visit (INDEPENDENT_AMBULATORY_CARE_PROVIDER_SITE_OTHER): Payer: BLUE CROSS/BLUE SHIELD

## 2015-04-08 DIAGNOSIS — J309 Allergic rhinitis, unspecified: Secondary | ICD-10-CM | POA: Diagnosis not present

## 2015-04-15 ENCOUNTER — Ambulatory Visit (INDEPENDENT_AMBULATORY_CARE_PROVIDER_SITE_OTHER): Payer: BLUE CROSS/BLUE SHIELD

## 2015-04-15 DIAGNOSIS — J309 Allergic rhinitis, unspecified: Secondary | ICD-10-CM

## 2015-04-22 ENCOUNTER — Encounter: Payer: Self-pay | Admitting: Internal Medicine

## 2015-04-22 ENCOUNTER — Ambulatory Visit (INDEPENDENT_AMBULATORY_CARE_PROVIDER_SITE_OTHER): Payer: BLUE CROSS/BLUE SHIELD

## 2015-04-22 ENCOUNTER — Telehealth: Payer: Self-pay | Admitting: Internal Medicine

## 2015-04-22 DIAGNOSIS — J309 Allergic rhinitis, unspecified: Secondary | ICD-10-CM | POA: Diagnosis not present

## 2015-04-22 NOTE — Telephone Encounter (Signed)
Error

## 2015-04-29 ENCOUNTER — Ambulatory Visit (INDEPENDENT_AMBULATORY_CARE_PROVIDER_SITE_OTHER): Payer: BLUE CROSS/BLUE SHIELD

## 2015-04-29 DIAGNOSIS — J309 Allergic rhinitis, unspecified: Secondary | ICD-10-CM | POA: Diagnosis not present

## 2015-05-06 ENCOUNTER — Ambulatory Visit: Payer: BLUE CROSS/BLUE SHIELD

## 2015-05-08 ENCOUNTER — Ambulatory Visit (INDEPENDENT_AMBULATORY_CARE_PROVIDER_SITE_OTHER): Payer: BLUE CROSS/BLUE SHIELD

## 2015-05-08 DIAGNOSIS — J309 Allergic rhinitis, unspecified: Secondary | ICD-10-CM | POA: Diagnosis not present

## 2015-05-13 ENCOUNTER — Ambulatory Visit (INDEPENDENT_AMBULATORY_CARE_PROVIDER_SITE_OTHER): Payer: BLUE CROSS/BLUE SHIELD

## 2015-05-13 DIAGNOSIS — J309 Allergic rhinitis, unspecified: Secondary | ICD-10-CM | POA: Diagnosis not present

## 2015-05-20 ENCOUNTER — Ambulatory Visit (INDEPENDENT_AMBULATORY_CARE_PROVIDER_SITE_OTHER): Payer: BLUE CROSS/BLUE SHIELD

## 2015-05-20 DIAGNOSIS — J309 Allergic rhinitis, unspecified: Secondary | ICD-10-CM | POA: Diagnosis not present

## 2015-05-27 ENCOUNTER — Ambulatory Visit (INDEPENDENT_AMBULATORY_CARE_PROVIDER_SITE_OTHER): Payer: BLUE CROSS/BLUE SHIELD

## 2015-05-27 DIAGNOSIS — J309 Allergic rhinitis, unspecified: Secondary | ICD-10-CM | POA: Diagnosis not present

## 2015-06-14 ENCOUNTER — Ambulatory Visit (INDEPENDENT_AMBULATORY_CARE_PROVIDER_SITE_OTHER): Payer: BLUE CROSS/BLUE SHIELD

## 2015-06-14 DIAGNOSIS — J309 Allergic rhinitis, unspecified: Secondary | ICD-10-CM | POA: Diagnosis not present

## 2015-06-17 ENCOUNTER — Ambulatory Visit (INDEPENDENT_AMBULATORY_CARE_PROVIDER_SITE_OTHER): Payer: BLUE CROSS/BLUE SHIELD

## 2015-06-17 DIAGNOSIS — J309 Allergic rhinitis, unspecified: Secondary | ICD-10-CM | POA: Diagnosis not present

## 2015-06-24 ENCOUNTER — Encounter: Payer: Self-pay | Admitting: Internal Medicine

## 2015-06-24 ENCOUNTER — Ambulatory Visit (INDEPENDENT_AMBULATORY_CARE_PROVIDER_SITE_OTHER): Payer: BLUE CROSS/BLUE SHIELD

## 2015-06-24 DIAGNOSIS — J309 Allergic rhinitis, unspecified: Secondary | ICD-10-CM | POA: Diagnosis not present

## 2015-07-01 ENCOUNTER — Ambulatory Visit (INDEPENDENT_AMBULATORY_CARE_PROVIDER_SITE_OTHER): Payer: BLUE CROSS/BLUE SHIELD

## 2015-07-01 DIAGNOSIS — J309 Allergic rhinitis, unspecified: Secondary | ICD-10-CM

## 2015-07-08 ENCOUNTER — Ambulatory Visit (INDEPENDENT_AMBULATORY_CARE_PROVIDER_SITE_OTHER): Payer: BLUE CROSS/BLUE SHIELD

## 2015-07-08 DIAGNOSIS — J309 Allergic rhinitis, unspecified: Secondary | ICD-10-CM | POA: Diagnosis not present

## 2015-07-11 ENCOUNTER — Ambulatory Visit (INDEPENDENT_AMBULATORY_CARE_PROVIDER_SITE_OTHER): Payer: BLUE CROSS/BLUE SHIELD

## 2015-07-11 DIAGNOSIS — J309 Allergic rhinitis, unspecified: Secondary | ICD-10-CM | POA: Diagnosis not present

## 2015-07-11 NOTE — Progress Notes (Signed)
Immunotherapy   Patient Details  Name: SYEDA PRICKETT MRN: [id number] Date of Birth: [birth year]  07/11/2015  Sarajane Marek here to pick up RED 1:100<GRASS-TREE> Following schedule: C 1XWK .10 GIVEN Frequency:1 time per week @ .50 Q 2 WKS Epi-Pen:Epi-Pen Avaiable  Consent signed and patient instructions given. NO PROBLEMS   Loel Betancur 07/11/2015, 4:55 PM

## 2015-07-15 ENCOUNTER — Ambulatory Visit (INDEPENDENT_AMBULATORY_CARE_PROVIDER_SITE_OTHER): Payer: BLUE CROSS/BLUE SHIELD

## 2015-07-15 DIAGNOSIS — J309 Allergic rhinitis, unspecified: Secondary | ICD-10-CM | POA: Diagnosis not present

## 2015-07-22 ENCOUNTER — Ambulatory Visit (INDEPENDENT_AMBULATORY_CARE_PROVIDER_SITE_OTHER): Payer: BLUE CROSS/BLUE SHIELD

## 2015-07-22 DIAGNOSIS — J309 Allergic rhinitis, unspecified: Secondary | ICD-10-CM | POA: Diagnosis not present

## 2015-07-29 ENCOUNTER — Ambulatory Visit (INDEPENDENT_AMBULATORY_CARE_PROVIDER_SITE_OTHER): Payer: BLUE CROSS/BLUE SHIELD

## 2015-07-29 DIAGNOSIS — J309 Allergic rhinitis, unspecified: Secondary | ICD-10-CM

## 2015-08-05 ENCOUNTER — Ambulatory Visit (INDEPENDENT_AMBULATORY_CARE_PROVIDER_SITE_OTHER): Payer: BLUE CROSS/BLUE SHIELD

## 2015-08-05 DIAGNOSIS — J309 Allergic rhinitis, unspecified: Secondary | ICD-10-CM

## 2015-08-12 ENCOUNTER — Ambulatory Visit: Payer: BLUE CROSS/BLUE SHIELD

## 2015-08-19 ENCOUNTER — Ambulatory Visit (INDEPENDENT_AMBULATORY_CARE_PROVIDER_SITE_OTHER): Payer: BLUE CROSS/BLUE SHIELD

## 2015-08-19 DIAGNOSIS — J309 Allergic rhinitis, unspecified: Secondary | ICD-10-CM | POA: Diagnosis not present

## 2015-09-02 ENCOUNTER — Ambulatory Visit: Payer: BLUE CROSS/BLUE SHIELD

## 2015-09-16 ENCOUNTER — Ambulatory Visit (INDEPENDENT_AMBULATORY_CARE_PROVIDER_SITE_OTHER): Payer: BLUE CROSS/BLUE SHIELD

## 2015-09-16 DIAGNOSIS — J309 Allergic rhinitis, unspecified: Secondary | ICD-10-CM

## 2015-10-02 ENCOUNTER — Ambulatory Visit (INDEPENDENT_AMBULATORY_CARE_PROVIDER_SITE_OTHER): Payer: BLUE CROSS/BLUE SHIELD

## 2015-10-02 DIAGNOSIS — J309 Allergic rhinitis, unspecified: Secondary | ICD-10-CM | POA: Diagnosis not present

## 2015-10-16 ENCOUNTER — Ambulatory Visit (INDEPENDENT_AMBULATORY_CARE_PROVIDER_SITE_OTHER): Payer: BLUE CROSS/BLUE SHIELD

## 2015-10-16 DIAGNOSIS — J309 Allergic rhinitis, unspecified: Secondary | ICD-10-CM | POA: Diagnosis not present

## 2015-11-19 ENCOUNTER — Encounter: Payer: Self-pay | Admitting: Nurse Practitioner

## 2015-11-19 ENCOUNTER — Ambulatory Visit (INDEPENDENT_AMBULATORY_CARE_PROVIDER_SITE_OTHER): Payer: BLUE CROSS/BLUE SHIELD | Admitting: Nurse Practitioner

## 2015-11-19 VITALS — BP 120/76 | HR 64 | Ht 61.5 in | Wt 188.0 lb

## 2015-11-19 DIAGNOSIS — Z113 Encounter for screening for infections with a predominantly sexual mode of transmission: Secondary | ICD-10-CM

## 2015-11-19 DIAGNOSIS — Z0000 Encounter for general adult medical examination without abnormal findings: Secondary | ICD-10-CM

## 2015-11-19 DIAGNOSIS — Z01419 Encounter for gynecological examination (general) (routine) without abnormal findings: Secondary | ICD-10-CM

## 2015-11-19 LAB — POCT URINALYSIS DIPSTICK
Bilirubin, UA: NEGATIVE
Blood, UA: NEGATIVE
GLUCOSE UA: NEGATIVE
Ketones, UA: NEGATIVE
LEUKOCYTES UA: NEGATIVE
NITRITE UA: NEGATIVE
Protein, UA: NEGATIVE
UROBILINOGEN UA: 1
pH, UA: 7

## 2015-11-19 LAB — COMPREHENSIVE METABOLIC PANEL
ALBUMIN: 3.9 g/dL (ref 3.6–5.1)
ALK PHOS: 88 U/L (ref 33–115)
ALT: 11 U/L (ref 6–29)
AST: 13 U/L (ref 10–30)
BILIRUBIN TOTAL: 1 mg/dL (ref 0.2–1.2)
BUN: 11 mg/dL (ref 7–25)
CALCIUM: 8.7 mg/dL (ref 8.6–10.2)
CO2: 22 mmol/L (ref 20–31)
Chloride: 107 mmol/L (ref 98–110)
Creat: 0.63 mg/dL (ref 0.50–1.10)
Glucose, Bld: 85 mg/dL (ref 65–99)
Potassium: 4.6 mmol/L (ref 3.5–5.3)
Sodium: 138 mmol/L (ref 135–146)
Total Protein: 6.9 g/dL (ref 6.1–8.1)

## 2015-11-19 LAB — CBC WITH DIFFERENTIAL/PLATELET
BASOS ABS: 0.1 10*3/uL (ref 0.0–0.1)
BASOS PCT: 1 % (ref 0–1)
EOS ABS: 0.1 10*3/uL (ref 0.0–0.7)
EOS PCT: 1 % (ref 0–5)
HCT: 35.9 % — ABNORMAL LOW (ref 36.0–46.0)
Hemoglobin: 11.4 g/dL — ABNORMAL LOW (ref 12.0–15.0)
LYMPHS ABS: 1.7 10*3/uL (ref 0.7–4.0)
Lymphocytes Relative: 30 % (ref 12–46)
MCH: 26.7 pg (ref 26.0–34.0)
MCHC: 31.8 g/dL (ref 30.0–36.0)
MCV: 84.1 fL (ref 78.0–100.0)
MONOS PCT: 10 % (ref 3–12)
MPV: 10.8 fL (ref 8.6–12.4)
Monocytes Absolute: 0.6 10*3/uL (ref 0.1–1.0)
NEUTROS PCT: 58 % (ref 43–77)
Neutro Abs: 3.3 10*3/uL (ref 1.7–7.7)
PLATELETS: 234 10*3/uL (ref 150–400)
RBC: 4.27 MIL/uL (ref 3.87–5.11)
RDW: 15.6 % — AB (ref 11.5–15.5)
WBC: 5.7 10*3/uL (ref 4.0–10.5)

## 2015-11-19 LAB — TSH: TSH: 1.19 m[IU]/L

## 2015-11-19 LAB — LIPID PANEL
Cholesterol: 141 mg/dL (ref 125–200)
HDL: 81 mg/dL (ref >=46)
LDL CALC: 53 mg/dL (ref <130)
TRIGLYCERIDES: 35 mg/dL (ref <150)
Total CHOL/HDL Ratio: 1.7 Ratio (ref <=5.0)
VLDL: 7 mg/dL (ref <30)

## 2015-11-19 NOTE — Patient Instructions (Signed)

## 2015-11-19 NOTE — Progress Notes (Signed)
Patient ID: Lacey Lee, female   DOB: [birth year], 40 y.o.   MRN: [id number] 40 y.o. G2P0020 Single  African American Fe here for annual exam.  Since Mirena IUD insertion 02/2013 she has continued to have monthly menses and most time lasting 7 days.  Flow is heavy X 1 day, but less clots.  Some cramps 1 week prior but this is better on IUD.  Some PMS.  Now buying a town home, she is awaiting approval.  New partner for 4 months.  Desires STD's. Last PUS: 11/21/13 with several small fibroids.  Patient's last menstrual period was 10/29/2015 (exact date).          Sexually active: Yes.    The current method of family planning is condoms never and IUD. Mirena inserted 03/21/13.   Exercising: Yes.    cardio and strength training 3-4 days per week at [employer]. Smoker:  no  Health Maintenance: Pap:  11/13/14, Negative with neg HR HPV (remote + HR HPV with negative # 16 & 18 2006 & 2007) TDaP:  2012 HIV: 10/25/08, wants STD panel today Labs: HB: 10.8 (gave blood 8 wk's ago)  Urine: Urobilinogen=1, others negative   reports that she has never smoked. She has never used smokeless tobacco. She reports that she drinks alcohol. She reports that she does not use illicit drugs.  Past Medical History  Diagnosis Date  . Neurofibromatosis ([place])   . Ectopic pregnancy 1999  . Seasonal allergies   . Asthma     albuterol rescue inhaler-uses prn, exacerbated by pet dander  . 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 Brain tumor (benign) ([place]) found at childhood    non-malignant    Past Surgical History  Procedure Laterality Date  . Fallopian tube removed  in college - 1999  . Wisdom tooth extraction    . Robot assisted myomectomy N/A 12/16/2012    Procedure: ROBOTIC ASSISTED MYOMECTOMY;  Surgeon: Governor Specking, MD;  Location: [facility];  Service: Gynecology;  Laterality: N/A;  . Robotic assisted laparoscopic lysis of adhesion  12/16/2012    Procedure: ROBOTIC ASSISTED LAPAROSCOPIC  LYSIS OF ADHESION;  Surgeon: Governor Specking, MD;  Location: [facility];  Service: Gynecology;;  with excision of perotoneal lesions  . Intrauterine device insertion  03/21/13    Mirena    Current Outpatient Prescriptions  Medication Sig Dispense Refill  . albuterol (PROVENTIL HFA;VENTOLIN HFA) 108 (90 BASE) MCG/ACT inhaler Inhale 2 puffs into the lungs every 6 (six) hours as needed for wheezing.     . Biotin 5000 MCG CAPS Take by mouth daily.    Marland Kitchen EPIPEN 2-PAK 0.3 MG/0.3ML DEVI as needed (environmental allergies).     Marland Kitchen ibuprofen (ADVIL,MOTRIN) 200 MG tablet Take 800 mg by mouth every 6 (six) hours as needed for pain.    Marland Kitchen levonorgestrel (MIRENA) 20 MCG/24HR IUD 1 each by Intrauterine route once.    . Naproxen Sodium (ALEVE PO) Take by mouth as needed.     . Omega-3 Fatty Acids (FISH OIL) 1200 MG CAPS Take by mouth daily.     No current facility-administered medications for this visit.    Family History  Problem Relation Age of Onset  . Allergies Mother     food  . Hypertension Mother   . Hyperlipidemia Mother   . Heart failure Mother   . Thyroid disease Mother   . Diabetes Mother   . Lung cancer Father   . Diabetes Sister   .  Hypertension Sister   . Uterine cancer Paternal Grandmother     ROS:  Pertinent items are noted in HPI.  Otherwise, a comprehensive ROS was negative.  Exam:   BP 120/76 mmHg  Pulse 64  Ht 5' 1.5" (1.562 m)  Wt 188 lb (85.276 kg)  BMI 34.95 kg/m2  LMP 10/29/2015 (Exact Date) Height: 5' 1.5" (156.2 cm) Ht Readings from Last 3 Encounters:  11/19/15 5' 1.5" (1.562 m)  11/13/14 5\' 1"  (1.549 m)  11/21/13 5\' 1"  (1.549 m)    General appearance: alert, cooperative and appears stated age Head: Normocephalic, without obvious abnormality, atraumatic Neck: no adenopathy, supple, symmetrical, trachea midline and thyroid normal to inspection and palpation Lungs: clear to auscultation bilaterally Breasts: normal appearance, no masses or tenderness Heart:  regular rate and rhythm Abdomen: soft, non-tender; no masses,  no organomegaly Extremities: extremities normal, atraumatic, no cyanosis or edema Skin: Skin color, texture, turgor normal. No rashes or lesions Lymph nodes: Cervical, supraclavicular, and axillary nodes normal. No abnormal inguinal nodes palpated Neurologic: Grossly normal   Pelvic: External genitalia:  no lesions              Urethra:  normal appearing urethra with no masses, tenderness or lesions              Bartholin's and Skene's: normal                 Vagina: normal appearing vagina with normal color and discharge, no lesions              Cervix: anteverted  IUD strings are visible              Pap taken: Yes.   Bimanual Exam:  Uterus:  normal size, contour, position, consistency, mobility, non-tender and enlarged, 8 weeks size              Adnexa: no mass, fullness, tenderness               Rectovaginal: Confirms               Anus:  normal sphincter tone, no lesions  Chaperone present: yes  A:  Well Woman with normal exam  History of numerous UTE fibroids with Myomectomy and LOA 12/16/12 History of Menorrhagia with Mirena IUD 02/2013 Endometriosis with dysmenorrhea  Remote history of normal pap with + HR HPV 2006 & 2007 = negative # 16 & 18 Intentional wt loss with current BMI 34.95  Anemia with longer menses on IUD   P:   Reviewed health and wellness pertinent to exam  Pap smear as above  Will follow with labs  Will have Dr. Sabra Heck review her chart  Counseled on breast self exam, adequate intake of calcium and vitamin D, diet and exercise return annually or prn  An After Visit Summary was printed and given to the patient.

## 2015-11-19 NOTE — Progress Notes (Signed)
I'm happy to see her if she wants the Mirena IUD removed.  I would encourage her to keep it but it is her decision to make.  Reviewed personally.  Felipa Emory, MD.

## 2015-11-20 LAB — VITAMIN D 25 HYDROXY (VIT D DEFICIENCY, FRACTURES): Vit D, 25-Hydroxy: 12 ng/mL — ABNORMAL LOW (ref 30–100)

## 2015-11-20 LAB — STD PANEL
HEP B S AG: NEGATIVE
HIV 1&2 Ab, 4th Generation: 0.06

## 2015-11-20 LAB — WET PREP BY MOLECULAR PROBE
Candida species: NEGATIVE
Gardnerella vaginalis: POSITIVE — AB
TRICHOMONAS VAG: NEGATIVE

## 2015-11-21 ENCOUNTER — Other Ambulatory Visit: Payer: Self-pay

## 2015-11-21 ENCOUNTER — Other Ambulatory Visit: Payer: Self-pay | Admitting: Certified Nurse Midwife

## 2015-11-21 DIAGNOSIS — B9689 Other specified bacterial agents as the cause of diseases classified elsewhere: Secondary | ICD-10-CM

## 2015-11-21 DIAGNOSIS — N760 Acute vaginitis: Principal | ICD-10-CM

## 2015-11-21 DIAGNOSIS — E559 Vitamin D deficiency, unspecified: Secondary | ICD-10-CM

## 2015-11-21 LAB — IPS N GONORRHOEA AND CHLAMYDIA BY PCR

## 2015-11-21 LAB — IPS PAP TEST WITH HPV

## 2015-11-21 LAB — HEMOGLOBIN, FINGERSTICK: HEMOGLOBIN, FINGERSTICK: 10.8 g/dL — AB (ref 12.0–16.0)

## 2015-11-21 MED ORDER — METRONIDAZOLE 0.75 % VA GEL: VAGINAL | Status: DC

## 2015-11-21 MED ORDER — VITAMIN D (ERGOCALCIFEROL) 1.25 MG (50000 UNIT) PO CAPS: 50000.0000 [IU] | ORAL_CAPSULE | ORAL | Status: DC

## 2015-11-22 ENCOUNTER — Telehealth: Payer: Self-pay | Admitting: Nurse Practitioner

## 2015-11-22 NOTE — Telephone Encounter (Signed)
Patient is calling about her results and also has a question about a medication.

## 2015-11-22 NOTE — Telephone Encounter (Signed)
Notes Recorded by Susy Manor, CMA on 11/22/2015 at 8:09 AM Pap smear & gc/chl results sent to mychart Notes Recorded by Susy Manor, CMA on 11/22/2015 at 8:02 AM aex is 2/18 Notes Recorded by Regina Eck, CNM on 11/22/2015 at 7:57 AM Notify patient Pap smear negative, HPVHR not detected 02 BV noted, already sent Rx due to affirm findings GC, Chlamydia negative [facility] patient Notes Recorded by Susy Manor, CMA on 11/21/2015 at 9:15 AM Patient notified of normal results as written by provider. Vit d recheck appt scheduled & rx sent to pharmacy Notes Recorded by Susy Manor, CMA on 11/21/2015 at 9:04 AM Per protocol vit d 50,000iu once weekly for 64mths then recheck level Notes Recorded by Regina Eck, CNM on 11/21/2015 at 8:01 AM Notify patient that affirm is positive for BV, negative for yeast and trichomonas Order in for Metrogel give Instructions Hep B, HIV,RPR are negative TSH is normal Lipid panel is normal Liver, kidney, glucose profile is normal CBC slight decrease in iron level, make sure getting iron foods in diet daily to maintain normal level Vita.D is very low need Rx

## 2015-11-22 NOTE — Telephone Encounter (Signed)
Spoke with patient. Advised of message as seen below from [place]. She is agreeable to the remainder of her results and verbalizes understanding. Aware rx for Metrogel has been sent to her pharmacy on file. States she started her cycle today. Advised she will need to wait until her cycle has stopped to begin treatment with Metrogel. She is agreeable.  Routing to provider for final review. Patient agreeable to disposition. Will close encounter.

## 2015-12-05 ENCOUNTER — Telehealth: Payer: Self-pay | Admitting: Nurse Practitioner

## 2015-12-05 MED ORDER — FLUCONAZOLE 150 MG PO TABS: 150.0000 mg | ORAL_TABLET | Freq: Once | ORAL | Status: DC

## 2015-12-05 NOTE — Telephone Encounter (Signed)
Rx for Diflucan 150 mg #2 0RF sent to pharmacy on file. Patient has been notified and is agreeable. Patient is asking about Dr.Miller's recommendation regarding IUD. Advised of message as seen below from [place]. She is agreeable. Would like to monitor her cycle over the next month and return call if she would like to have her IUD removed.  Megan Salon, MD at 11/19/2015 12:47 PM     Status: Signed       Expand All Collapse All   I'm happy to see her if she wants the Mirena IUD removed. I would encourage her to keep it but it is her decision to make. Reviewed personally. Felipa Emory, MD.         Routing to provider for final review. Patient agreeable to disposition. Will close encounter.

## 2015-12-05 NOTE — Telephone Encounter (Signed)
[place] for Diflucan, if not resolving needs OV

## 2015-12-05 NOTE — Telephone Encounter (Signed)
Patient is returning a call to Kaitlyn. °

## 2015-12-05 NOTE — Telephone Encounter (Signed)
Patient called and said, "Lacey Lee told me just to call in if I needed something else for my yeast infection and I do. I need the strongest thing there is." Patient last seen 11/19/15. Patient declined an appointment. Pharmacy on file is correct.

## 2015-12-05 NOTE — Telephone Encounter (Addendum)
Left message to call [place] at [phone].  Patient was seen in office on 11/19/2015 with Kem Boroughs, FNP. Treated for BV with Metrogel x 5 days. Did patient complete treatment? Triage symptoms.

## 2015-12-05 NOTE — Telephone Encounter (Signed)
Spoke with patient. Patient states that she completed her 5 day course or Metrogel. Yesterday she began to have vaginal itching and "curdy" white discharge. Patient is requesting rx for yeast be sent to her pharmacy on file. Advised I will speak with the covering provider and return call with additional recommendations. She is agreeable.  Melvia Heaps CNM okay to send in rx for Diflucan 150 mg #2 0RF?

## 2016-02-09 ENCOUNTER — Other Ambulatory Visit: Payer: Self-pay | Admitting: Nurse Practitioner

## 2016-02-10 MED ORDER — VITAMIN D (ERGOCALCIFEROL) 1.25 MG (50000 UNIT) PO CAPS: 50000.0000 [IU] | ORAL_CAPSULE | ORAL | Status: DC

## 2016-02-10 NOTE — Telephone Encounter (Signed)
She can have 1 refill but needs to come in for labs.

## 2016-02-10 NOTE — Telephone Encounter (Signed)
Spoke with patient, she is set up for 02-18-16 for recheck of Vitamin D- eh

## 2016-02-10 NOTE — Telephone Encounter (Signed)
Medication refill request: vitamin D  Last AEX:  11-19-15  Next AEX: 11-24-16 Last MMG (if hormonal medication request): N/A Refill authorized: please advise

## 2016-02-13 ENCOUNTER — Encounter: Payer: Self-pay | Admitting: Internal Medicine

## 2016-02-13 ENCOUNTER — Telehealth: Payer: Self-pay | Admitting: Nurse Practitioner

## 2016-02-13 DIAGNOSIS — N939 Abnormal uterine and vaginal bleeding, unspecified: Secondary | ICD-10-CM

## 2016-02-13 NOTE — Telephone Encounter (Signed)
Call to patient. She is calling to request evaluation by Dr. Sabra Heck for her increased cycles with Mirena IUD that was placed 03/21/13. She is requesting an ultrasound and has lab appointment in our office on Tuesday and would like to plan for ultrasound then, if possible.  She states that she has been having increasing cramps and bleeding with cycles. LMP 02/06/16. She reports she has been wearing a super tampon and pad and changing coverage 3 times per day. She is sexually active. Office visit 11/19/15 with Kem Boroughs, FNP IUD strings visualized.  Patient thinks she may have another fibroid. Would like to plan for ultrasound if Dr. Sabra Heck agrees.  Advised will return call. Requested patient take home pregnancy test as well and call with results. She is agreeable.

## 2016-02-13 NOTE — Telephone Encounter (Signed)
Patient wants to come in to have her IUD checked to make sure it is still in place.

## 2016-02-14 ENCOUNTER — Telehealth: Payer: Self-pay | Admitting: Emergency Medicine

## 2016-02-14 NOTE — Telephone Encounter (Signed)
Spoke with patient regarding benefits for ultrasound and reviewed appointment information as documented in previous note. Patient is agreeable to 02/18/16 at 4:00 appointment. Patient is aware of cancellation policy. Routing to [employer]

## 2016-02-14 NOTE — Addendum Note (Signed)
Addended by: Michele Mcalpine on: 02/14/2016 08:19 AM   Modules accepted: Orders

## 2016-02-14 NOTE — Telephone Encounter (Signed)
Call to patient to schedule Pelvic ultrasound  With consult with Dr. Sabra Heck.  Patient has lab appointment on Tuesday and would like appointment then.  Message left to return call to [place] at [phone] or triage nurse at [phone].  If patient returns call to office can schedule ultrasound at her convenience. I have scheduled patient for 02/18/16 at 4:00 with 4:30 consult with Dr. Sabra Heck, if patient would like this appointment. Can cancel if this time does not work for her.

## 2016-02-14 NOTE — Telephone Encounter (Signed)
Ultrasound and then OV is fine with me.  Thanks.

## 2016-02-14 NOTE — Telephone Encounter (Signed)
Pelvic ultrasound scheduled and patient aware for 02/18/16 with consult with Dr. Sabra Heck.

## 2016-02-14 NOTE — Telephone Encounter (Signed)
Noted message from [facility].  Patient agreeable to ultrasound appointment as scheduled.  Routing to provider for final review. Patient agreeable to disposition. Will close encounter.

## 2016-02-18 ENCOUNTER — Ambulatory Visit (INDEPENDENT_AMBULATORY_CARE_PROVIDER_SITE_OTHER): Payer: BLUE CROSS/BLUE SHIELD | Admitting: Obstetrics & Gynecology

## 2016-02-18 ENCOUNTER — Encounter: Payer: Self-pay | Admitting: Obstetrics & Gynecology

## 2016-02-18 ENCOUNTER — Other Ambulatory Visit: Payer: BLUE CROSS/BLUE SHIELD

## 2016-02-18 ENCOUNTER — Other Ambulatory Visit: Payer: BLUE CROSS/BLUE SHIELD | Admitting: Obstetrics & Gynecology

## 2016-02-18 ENCOUNTER — Ambulatory Visit (INDEPENDENT_AMBULATORY_CARE_PROVIDER_SITE_OTHER): Payer: BLUE CROSS/BLUE SHIELD

## 2016-02-18 VITALS — BP 132/86 | HR 74 | Resp 18 | Wt 188.0 lb

## 2016-02-18 DIAGNOSIS — E559 Vitamin D deficiency, unspecified: Secondary | ICD-10-CM | POA: Diagnosis not present

## 2016-02-18 DIAGNOSIS — D251 Intramural leiomyoma of uterus: Secondary | ICD-10-CM | POA: Diagnosis not present

## 2016-02-18 DIAGNOSIS — N939 Abnormal uterine and vaginal bleeding, unspecified: Secondary | ICD-10-CM | POA: Diagnosis not present

## 2016-02-18 NOTE — Progress Notes (Signed)
40 y.o. G3P0020 Single Caucasian female here for pelvic ultrasound due to increased bleeding.  Pt has known hx of fibroids and endometriosis and laparoscopic myomectomy with Dr. Kerin Perna 12/16/12.  Pt had Mirena IUD placed 03/21/13.  Cycles did not significantly decreased after the IUD was placed.  Flow typically 4-5 days.  However, the past few months, flow has increased.  Now using super tampon and pad.  Having to change 3 times a day, which isn't terrible and the pt realizes this, but a change for her.  She really doesn't want to have another surgery for fibroids, if possible.  She is here for assessment and, hopefully, some reassurance.    Last pap 2/17.  Negative.    Patient's last menstrual period was 02/13/2016.  Contraception: mirena iUD  Findings:  UTERUS: 8.3 x 5 x 4.1cm (enlarged from prior PUS) with multiple fibroids measuring 2.2cm, 1.6cm, 0.7cm, 1.5cm, and 1.3cm.  Increased number and size noted.  IUD in correct location within endometrium EMS:4.2mm, possible encroachment with fibroid ADNEXA: Left ovary: 2.4 x 2.2 x 1.7cm with 64mm simple follicle       Right ovary: 3.5 x 2.7 x 1.8cm with 0000000 simple follice CUL DE SAC: no free fluid  Discussion:  Images reviewed with pt and prior ones used for comparison.  Uterine size is increased as well as the number and size of fibroids.  Pt has undergone a myomectomy in the past.  Has been trying to not do anything else in hopes that she may be able to have a pregnancy in the future.  Not married so not actively pursuing that at this time.  D/w pt possible endometrial encroachment with fibroid that could be treated (if truly present) with hysteroscopy and fibroid resection.  As well, could plan to remove IUD and replace it to see if this would help with bleeding.  D/W pt my experience with this and other patients.   She thinks, for now, she will just keep waiting and watching.  She is reassured that her fibroids are not large, although  increasing in size and number.  She also knows that a hysterectomy will resolve all of these issues.  Feels if she gets to 10 without being married, will be more interested in this.  Knows to call if cycles continue to worsen in severity or length.    Assessment:  Intramural fibroids H/O laparoscopic myomectomy Mirena IUD in correct location H/O endometriosis DUB  Plan:  Pt desires to continue with conservative management.  She is aware to call with any change in bleeding for additional evaluation.  She declines additional evaluation.  However, if cycles continue to worsen, I would recommend IUD removal, SHGM to assess endometrium for encroaching fibroid.  If this were present, hysteroscopy with resection would be recommended.  Then IUD could be replaced.  As well, hysterectomy is always an option when she is done considering the possibility of child bearing.  All questions answered.    ~25 minutes spent with patient >50% of time was in face to face discussion of ultrasound results with comparison to prior images, possible treatment options.

## 2016-02-19 LAB — VITAMIN D 25 HYDROXY (VIT D DEFICIENCY, FRACTURES): Vit D, 25-Hydroxy: 17 ng/mL — ABNORMAL LOW (ref 30–100)

## 2016-03-27 ENCOUNTER — Telehealth: Payer: Self-pay | Admitting: *Deleted

## 2016-03-27 NOTE — Telephone Encounter (Signed)
WILL MAIL TODAY

## 2016-03-27 NOTE — Telephone Encounter (Signed)
Pt would like a detailed receipt stating charges and payments dated back from 11/05/2015.

## 2016-04-01 ENCOUNTER — Telehealth: Payer: Self-pay | Admitting: Allergy and Immunology

## 2016-04-01 NOTE — Telephone Encounter (Signed)
She needs an itemized bill dated back from February 27, 2015 through 11/05/15. Can you please mail this to her.

## 2016-04-02 NOTE — Telephone Encounter (Signed)
MAILED PRINTOUT FROM [facility]

## 2016-04-08 ENCOUNTER — Telehealth: Payer: Self-pay | Admitting: Allergy and Immunology

## 2016-04-08 NOTE — Telephone Encounter (Signed)
She needs an itemized statement from June of 2016 to February 2017. Can you please mail this to her? Thanks!

## 2016-04-09 NOTE — Telephone Encounter (Signed)
Mailed #22 printout for dates requested

## 2016-04-21 ENCOUNTER — Telehealth: Payer: Self-pay

## 2016-04-21 ENCOUNTER — Encounter: Payer: Self-pay | Admitting: Obstetrics & Gynecology

## 2016-04-21 DIAGNOSIS — N939 Abnormal uterine and vaginal bleeding, unspecified: Secondary | ICD-10-CM

## 2016-04-21 NOTE — Telephone Encounter (Signed)
Please see telephone encounter created to discuss with Dr.Miller.

## 2016-04-21 NOTE — Telephone Encounter (Signed)
Spoke with patient who states that her bleeding remains the same since she was last seen in the office. States her cycles are coming once per month and are a normal flow. LMP lasted 8-9 days. Reports her LMP would stop for 1 day and then restart, this occurred twice. Patient would like to have her Mirena removed and replaced. Advised I will speak with Dr.Miller and return call with further recommendations. She is agreeable. Patient was last seen in the office on 02/18/2016 for PUS due to AUB. Per OV note if cycles continued to worsen, she would need IUD removal, SHGM to assess endometrium for encroaching fibroid.  If the encroaching fibroid were present, hysteroscopy with resection would be recommended.  Then IUD could be replaced.  Visit Follow-Up Question  Message [id number]  From Lacey Lee To Lacey Salon, MD Sent 04/21/2016 2:23 PM  You had spoken with me on my last visit that we could change out my marina. And insert a new one at that time. so I was wonder if it would be possible to have that done next month.   Also if this does not work this time and would like to consider my options on partial hysterectomy.   Thank you  [place]   Responsible Party   Pool - [facility] Clinical Pool No one has taken responsibility for this message.  No actions have been taken on this message.

## 2016-04-22 NOTE — Telephone Encounter (Signed)
Your note is correct.  She needs to set up for PUS with IUD removal.  If there is no encroaching fibroid, can replace IUD.  Prob should schedule as SHGM but may not need that so just precert PUS.  I will also talk with her that day about hysterectomy if she wants.

## 2016-04-23 NOTE — Telephone Encounter (Signed)
Spoke with patient. Advised of message as seen below from [place]. Patient is agreeable. Appointment for PUS with possible SHGM scheduled for 05/14/2016 at 2:30 pm with 3 pm consult with Dr.Miller. She is agreeable to date and time. Order placed for precert.  Cc: Lerry Liner  Routing to provider for final review. Patient agreeable to disposition. Will close encounter.

## 2016-04-28 ENCOUNTER — Telehealth: Payer: Self-pay | Admitting: Obstetrics & Gynecology

## 2016-04-28 NOTE — Telephone Encounter (Signed)
Spoke with pt regarding benefit for sonohysterogram. Patient understood and agreeable. Patient scheduled 05/14/16 with Dr Sabra Heck. Pt aware of arrival date and time. Pt aware of 72 hours cancellation policy. No further questions. Ok to close

## 2016-04-28 NOTE — Telephone Encounter (Signed)
Called patient to review benefits for a recommended procedure. Left Voicemail requesting a call back. °

## 2016-05-14 ENCOUNTER — Other Ambulatory Visit: Payer: BLUE CROSS/BLUE SHIELD | Admitting: Obstetrics & Gynecology

## 2016-05-14 ENCOUNTER — Ambulatory Visit (INDEPENDENT_AMBULATORY_CARE_PROVIDER_SITE_OTHER): Payer: BLUE CROSS/BLUE SHIELD | Admitting: Obstetrics & Gynecology

## 2016-05-14 ENCOUNTER — Other Ambulatory Visit: Payer: BLUE CROSS/BLUE SHIELD

## 2016-05-14 ENCOUNTER — Ambulatory Visit (INDEPENDENT_AMBULATORY_CARE_PROVIDER_SITE_OTHER): Payer: BLUE CROSS/BLUE SHIELD

## 2016-05-14 ENCOUNTER — Encounter: Payer: Self-pay | Admitting: Obstetrics & Gynecology

## 2016-05-14 VITALS — BP 130/78 | HR 90 | Resp 18 | Ht 61.0 in | Wt 184.0 lb

## 2016-05-14 DIAGNOSIS — N939 Abnormal uterine and vaginal bleeding, unspecified: Secondary | ICD-10-CM

## 2016-05-14 DIAGNOSIS — D251 Intramural leiomyoma of uterus: Secondary | ICD-10-CM

## 2016-05-14 DIAGNOSIS — N921 Excessive and frequent menstruation with irregular cycle: Secondary | ICD-10-CM | POA: Diagnosis not present

## 2016-05-14 DIAGNOSIS — D250 Submucous leiomyoma of uterus: Secondary | ICD-10-CM

## 2016-05-14 NOTE — Progress Notes (Signed)
40 y.o. Go Single AA female here for a pelvic ultrasound with sonohystogram due to history of menorrhagia and uterine fibroids.  Pt is contemplating pregnancy at this time.  She has been desirous in the past of child bearing and still is.  However, she is not dating anyone and is just sick of her irregular bleeding.  Pt has undergone myomectomy in the past but is not sure that is something she wants at this time.  Just ready for this to be better.  Does have a Mirena IUD that has really not made much difference with bleeding.    Patient's last menstrual period was 04/24/2016.  Contraception: Not SA  Technique:  Both transabdominal and transvaginal ultrasound examinations of the pelvis were performed. Transabdominal technique was performed for global imaging of the pelvis including uterus, ovaries, adnexal regions, and pelvic cul-de-sac.  It was necessary to proceed with endovaginal exam following the abdominal ultrasound transabdominal exam to visualize the endometrium and adnexa.  Color and duplex Doppler ultrasound was utilized to evaluate blood flow to the ovaries.    FINDINGS: Uterus: 7.8 x 5.0 x 4.4cm, four fibroids noted Endometrium: IUD in correct location Adnexa:  Left: 2.6 x 1.3cm     Right:  2.8 x 1.6cm Cul de sac: no free fluid  SHSG:  After obtaining appropriate verbal consent from patient, the cervix was visualized using a speculum, and prepped with betadine.  A tenaculum  was applied to the cervix.  Dilation of the cervix was not necessary. The catheter was passed into the uterus and sterile saline introduced, with the following findings: two submucosal fibroids <1cm noted  Discussion:  Pt and I discussed findings.  In addition to hysterectomy, pt could consider IUD removal with fibroid resection.  Could consider replacing IUD at the same time, if can get insurance coverage.  Procedure for this including risks and benefits.  Recovery and pain management discussed.  Risks discussed  including but not limited to bleeding, rare risk of transfusion, infection, 1% risk of uterine perforation with risks of fluid deficit causing cardiac arrythmia, cerebral swelling and/or need to stop procedure early.  Fluid emboli and rare risk of death discussed.  DVT/PE, rare risk of risk of bowel/bladder/ureteral/vascular injury.  Patient aware if pathology abnormal she may need additional treatment.  Of course, this could do nothing to bleeding and she night need to come back and consider a hysterectomy.  All questions answered.    Assessment:  Menorrhagia Submucosal fibroids Mirena IUD use without much change in cycles  Plan:   Pt is considering hysteroscopic fibroid removal vs hysterectomy.  Information provided.  She will call back with any additional questions and/or decision.  ~30 minutes spent with patient >50% of time was in face to face discussion of above.

## 2016-07-06 ENCOUNTER — Encounter: Payer: Self-pay | Admitting: Obstetrics & Gynecology

## 2016-07-24 ENCOUNTER — Telehealth: Payer: Self-pay | Admitting: Obstetrics & Gynecology

## 2016-07-24 NOTE — Telephone Encounter (Signed)
Patient called requesting a call back from our surgery scheduler to schedule surgery.

## 2016-07-24 NOTE — Telephone Encounter (Signed)
Return call to patient. Discussed surgery date options. Patient interested in 08-07-16. She will confirm date with family and call back on Monday. Patient also requesting IUD replacement while in OR. Advised will check benefits for this.

## 2016-07-27 NOTE — Telephone Encounter (Signed)
Patient calls this morning and would like to proceed with surgery on 08-07-16.  Patient waiting on insurance information to decide if wants IUD in OR or at post op visit.

## 2016-07-28 ENCOUNTER — Ambulatory Visit (INDEPENDENT_AMBULATORY_CARE_PROVIDER_SITE_OTHER): Payer: BLUE CROSS/BLUE SHIELD | Admitting: Obstetrics & Gynecology

## 2016-07-28 ENCOUNTER — Encounter: Payer: Self-pay | Admitting: Obstetrics & Gynecology

## 2016-07-28 VITALS — BP 128/76 | HR 72 | Resp 14 | Ht 62.0 in | Wt 191.4 lb

## 2016-07-28 DIAGNOSIS — D251 Intramural leiomyoma of uterus: Secondary | ICD-10-CM

## 2016-07-28 MED ORDER — IBUPROFEN 800 MG PO TABS: 800.0000 mg | ORAL_TABLET | Freq: Four times a day (QID) | ORAL | 0 refills | Status: DC | PRN

## 2016-07-28 MED ORDER — HYDROCODONE-ACETAMINOPHEN 5-325 MG PO TABS: 1.0000 | ORAL_TABLET | Freq: Four times a day (QID) | ORAL | 0 refills | Status: DC | PRN

## 2016-07-28 NOTE — Progress Notes (Signed)
40 y.o. G2P0020 SingleAfrican American female here for discussion of upcoming procedure.  She has known fibroids and menorrhagia as well as a Mirena IUD that is present.  She has been considering hysterectomy but would still like to keep uterus if possible for child bearing.  With last PUS, two submucosal fibroids were noted.  D/W pt possible IUD removal and resection of fibroids with possible reinsertion of IUD.  She has decided that is what she wants to do.  We are actively working on being able to have the IUD for the day of surgery.  As this has to come from specialty pharmacy, additional paperwork has been done for this.    Procedure discussed with patient.  Recovery and pain management discussed.  Risks discussed including but not limited to bleeding, rare risk of transfusion, infection, 1% risk of uterine perforation with risks of fluid deficit causing cardiac arrythmia, cerebral swelling and/or need to stop procedure early.  Fluid emboli and rare risk of death discussed.  DVT/PE, rare risk of risk of bowel/bladder/ureteral/vascular injury.  Patient aware this may not control bleeding and she still may need to proceed with hysterectomy.   All questions answered.    Ob Hx:   Patient's last menstrual period was 07/09/2016.          Sexually active: Yes. Birth control: IUD Last pap: 11/19/15 negative, HR HPV negative  Last MMG: never Tobacco: Never smoker   Past Surgical History:  Procedure Laterality Date  . fallopian tube removed  in college - 1999  . INTRAUTERINE DEVICE INSERTION  03/21/13   Mirena  . ROBOT ASSISTED MYOMECTOMY N/A 12/16/2012   Procedure: ROBOTIC ASSISTED MYOMECTOMY;  Surgeon: Governor Specking, MD;  Location: [facility];  Service: Gynecology;  Laterality: N/A;  . ROBOTIC ASSISTED LAPAROSCOPIC LYSIS OF ADHESION  12/16/2012   Procedure: ROBOTIC ASSISTED LAPAROSCOPIC LYSIS OF ADHESION;  Surgeon: Governor Specking, MD;  Location: [facility];  Service: Gynecology;;  with excision of  perotoneal lesions  . WISDOM TOOTH EXTRACTION      Past Medical History:  Diagnosis Date  . Asthma    albuterol rescue inhaler-uses prn, exacerbated by pet dander  . Brain tumor (benign) ([place]) found at childhood   non-malignant  . Ectopic pregnancy 1999  . Neurofibromatosis ([place])   . Seasonal allergies   . 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 albuterol (PROVENTIL HFA;VENTOLIN HFA) 108 (90 BASE) MCG/ACT inhaler Inhale 2 puffs into the lungs every 6 (six) hours as needed for wheezing.     Marland Kitchen ALPRAZolam (XANAX) 0.25 MG tablet     . Biotin 5000 MCG CAPS Take by mouth daily.    Marland Kitchen buPROPion (WELLBUTRIN SR) 200 MG 12 hr tablet 2 (two) times daily.    . busPIRone (BUSPAR) 5 MG tablet     . cetirizine (ZYRTEC) 10 MG tablet TK 1 T PO QD PRN  0  . EPIPEN 2-PAK 0.3 MG/0.3ML DEVI as needed (environmental allergies).     Marland Kitchen ibuprofen (ADVIL,MOTRIN) 200 MG tablet Take 800 mg by mouth every 6 (six) hours as needed for pain.    Marland Kitchen levonorgestrel (MIRENA) 20 MCG/24HR IUD 1 each by Intrauterine route once.    . Naproxen Sodium (ALEVE PO) Take by mouth as needed.     . Omega-3 Fatty Acids (FISH OIL) 1200 MG CAPS Take by mouth daily.    . Vitamin  D, Ergocalciferol, (DRISDOL) 50000 units CAPS capsule Take 1 capsule (50,000 Units total) by mouth every 7 (seven) days. 12 capsule 0   No current facility-administered medications for this visit.     ROS: A comprehensive review of systems was negative except for: irregular and heavy bleeding at times  Exam:    BP 128/76 (BP Location: Right Arm, Patient Position: Sitting, Cuff Size: Normal)   Pulse 72   Resp 14   Ht 5\' 2"  (1.575 m)   Wt 191 lb 6.4 oz (86.8 kg)   LMP 07/09/2016   BMI 35.01 kg/m   General appearance: alert and cooperative Head: Normocephalic, without obvious abnormality, atraumatic Neck: no adenopathy,  supple, symmetrical, trachea midline and thyroid not enlarged, symmetric, no tenderness/mass/nodules Lungs: clear to auscultation bilaterally Heart: regular rate and rhythm, S1, S2 normal, no murmur, click, rub or gallop Abdomen: soft, non-tender; bowel sounds normal; no masses,  no organomegaly Extremities: extremities normal, atraumatic, no cyanosis or edema Skin: Skin color, texture, turgor normal. No rashes or lesions Lymph nodes: Cervical, supraclavicular, and axillary nodes normal. no inguinal nodes palpated Neurologic: Grossly normal  Pelvic:  No pelvic exam was performed  A: Uterine fibroids H/o DUB and menorrhagia, at times    P:  Hysteroscopic fibroid resection planned with removal and possible reinsertion of IUD Rx for Motrin and Vicodin given. Medications/Vitamins reviewed.  Pt knows needs to not take any ASA products before surgery. Pre-op instructions reviewed.  ~15 minutes spent with patient >50% of time was in face to face discussion of above.

## 2016-07-29 NOTE — Telephone Encounter (Signed)
Return call to patient. Left message to call back. Order form for IUD has been faxed to [facility].

## 2016-07-29 NOTE — Telephone Encounter (Signed)
patient returning your call.  Patient would like you to call her on her cell [phone]

## 2016-07-29 NOTE — Telephone Encounter (Signed)
Return call to patient. Female states she is not there today and will return on Friday.

## 2016-07-29 NOTE — Telephone Encounter (Signed)
Patient returning your call.

## 2016-07-30 NOTE — Telephone Encounter (Signed)
Patient returned call. Advised IUD order has been sent to [facility].  Confirmed surgery scheduled for 0830 on 08-07-16 at [facility]. Surgery instruction sheet reviewed with patient and she will stop by office to pick up copy and brochure from surgery center (instead of having it mailed, patient choice).   Routing to provider for final review. Patient agreeable to disposition. Will close encounter.

## 2016-07-31 ENCOUNTER — Telehealth: Payer: Self-pay | Admitting: *Deleted

## 2016-07-31 NOTE — Telephone Encounter (Signed)
Routing to provider for final review. Patient agreeable to disposition. Will close encounter.     

## 2016-07-31 NOTE — Telephone Encounter (Signed)
Patient called and said, "Please tell Gay Filler I contacted my pharmacy and everything is worked out now. Thank you!"

## 2016-07-31 NOTE — Telephone Encounter (Signed)
Call to [facility], spoke to [place],  requesting update on Mirena IUD order. The order has been received but they have to process insurance and get patient approval.   Mickie Kay that patient is scheduled for surgery and IUD removal and reinsertion on 08-07-16.  Les Pou states that he can process now and he did process claim while I was on hold. Now patient needs to call [facility] to confirm shipment. Once they speak to patient, they can ship for delivery by Wednesday next week.

## 2016-07-31 NOTE — Telephone Encounter (Signed)
Call to patient. Update provided to patient in Mirena IUD order. She will contact [facility] regarding shipment and call me back if any additional assistance needed.

## 2016-08-04 ENCOUNTER — Encounter (HOSPITAL_BASED_OUTPATIENT_CLINIC_OR_DEPARTMENT_OTHER): Payer: Self-pay | Admitting: *Deleted

## 2016-08-04 NOTE — Progress Notes (Signed)
NPO AFTER MN.  ARRIVE AT [id number].  NEEDS HG AND URINE PREG.  WILL TAKE AM MEDS W/ SIPS OF WATER DOS. PRE-OP ORDERS PENDING.

## 2016-08-05 ENCOUNTER — Telehealth: Payer: Self-pay | Admitting: *Deleted

## 2016-08-05 ENCOUNTER — Other Ambulatory Visit: Payer: Self-pay | Admitting: Obstetrics & Gynecology

## 2016-08-05 NOTE — Telephone Encounter (Signed)
Call to patient. Advised Mirena IUD has been received from speciality pharmacy. Will mail her paperwork that was enclosed.  Patient came by office this morning, prior to my arrival and picked surgery pack with instruction sheet and surgery center brochure.   Routing to provider for final review. Patient agreeable to disposition. Will close encounter.

## 2016-08-07 ENCOUNTER — Encounter (HOSPITAL_BASED_OUTPATIENT_CLINIC_OR_DEPARTMENT_OTHER): Payer: Self-pay | Admitting: *Deleted

## 2016-08-07 ENCOUNTER — Ambulatory Visit (HOSPITAL_BASED_OUTPATIENT_CLINIC_OR_DEPARTMENT_OTHER): Payer: BLUE CROSS/BLUE SHIELD | Admitting: Anesthesiology

## 2016-08-07 ENCOUNTER — Encounter (HOSPITAL_BASED_OUTPATIENT_CLINIC_OR_DEPARTMENT_OTHER)
Admission: RE | Disposition: A | Discharge status: Home / Self Care | Payer: Self-pay | Source: Ambulatory Visit | Attending: Obstetrics & Gynecology

## 2016-08-07 ENCOUNTER — Ambulatory Visit (HOSPITAL_BASED_OUTPATIENT_CLINIC_OR_DEPARTMENT_OTHER)
Admission: RE | Admit: 2016-08-07 | Discharge: 2016-08-07 | Disposition: A | Discharge status: Home / Self Care | Payer: BLUE CROSS/BLUE SHIELD | Source: Ambulatory Visit | Attending: Obstetrics & Gynecology | Admitting: Obstetrics & Gynecology

## 2016-08-07 DIAGNOSIS — D250 Submucous leiomyoma of uterus: Secondary | ICD-10-CM | POA: Diagnosis not present

## 2016-08-07 DIAGNOSIS — D509 Iron deficiency anemia, unspecified: Secondary | ICD-10-CM | POA: Diagnosis not present

## 2016-08-07 DIAGNOSIS — D251 Intramural leiomyoma of uterus: Secondary | ICD-10-CM | POA: Diagnosis not present

## 2016-08-07 DIAGNOSIS — N920 Excessive and frequent menstruation with regular cycle: Secondary | ICD-10-CM | POA: Insufficient documentation

## 2016-08-07 DIAGNOSIS — D259 Leiomyoma of uterus, unspecified: Secondary | ICD-10-CM | POA: Diagnosis present

## 2016-08-07 DIAGNOSIS — Z79899 Other long term (current) drug therapy: Secondary | ICD-10-CM | POA: Diagnosis not present

## 2016-08-07 DIAGNOSIS — Z975 Presence of (intrauterine) contraceptive device: Secondary | ICD-10-CM | POA: Diagnosis not present

## 2016-08-07 DIAGNOSIS — Z8049 Family history of malignant neoplasm of other genital organs: Secondary | ICD-10-CM | POA: Diagnosis not present

## 2016-08-07 DIAGNOSIS — J45909 Unspecified asthma, uncomplicated: Secondary | ICD-10-CM | POA: Insufficient documentation

## 2016-08-07 DIAGNOSIS — Z30432 Encounter for removal of intrauterine contraceptive device: Secondary | ICD-10-CM | POA: Insufficient documentation

## 2016-08-07 DIAGNOSIS — D649 Anemia, unspecified: Secondary | ICD-10-CM | POA: Diagnosis not present

## 2016-08-07 HISTORY — DX: Personal history of other complications of pregnancy, childbirth and the puerperium: Z87.59

## 2016-08-07 HISTORY — DX: Personal history of other infectious and parasitic diseases: Z86.19

## 2016-08-07 HISTORY — PX: DILATATION & CURETTAGE/HYSTEROSCOPY WITH MYOSURE: SHX6511

## 2016-08-07 HISTORY — PX: IUD REMOVAL: SHX5392

## 2016-08-07 HISTORY — DX: Personal history of other benign neoplasm: Z86.018

## 2016-08-07 LAB — POCT HEMOGLOBIN-HEMACUE: HEMOGLOBIN: 10.3 g/dL — AB (ref 12.0–15.0)

## 2016-08-07 LAB — POCT PREGNANCY, URINE: Preg Test, Ur: NEGATIVE

## 2016-08-07 SURGERY — DILATATION & CURETTAGE/HYSTEROSCOPY WITH MYOSURE
Anesthesia: General | Site: Vagina

## 2016-08-07 MED ORDER — LIDOCAINE 2% (20 MG/ML) 5 ML SYRINGE
INTRAMUSCULAR | Status: AC
  Filled 2016-08-07: qty 5

## 2016-08-07 MED ORDER — ONDANSETRON HCL 4 MG/2ML IJ SOLN
4.0000 mg | Freq: Once | INTRAMUSCULAR | Status: AC
  Administered 2016-08-07: 4 mg via INTRAVENOUS
  Filled 2016-08-07: qty 2

## 2016-08-07 MED ORDER — LIDOCAINE HCL 1 % IJ SOLN
INTRAMUSCULAR | Status: DC | PRN
  Administered 2016-08-07: 60 mg via INTRADERMAL

## 2016-08-07 MED ORDER — SODIUM CHLORIDE 0.9 % IR SOLN
Status: DC | PRN
  Administered 2016-08-07: 3000 mL

## 2016-08-07 MED ORDER — HYDROMORPHONE HCL 1 MG/ML IJ SOLN
INTRAMUSCULAR | Status: AC
  Filled 2016-08-07: qty 1

## 2016-08-07 MED ORDER — FENTANYL CITRATE (PF) 100 MCG/2ML IJ SOLN
INTRAMUSCULAR | Status: AC
  Filled 2016-08-07: qty 2

## 2016-08-07 MED ORDER — VASOPRESSIN 20 UNIT/ML IJ SOLN
INTRAMUSCULAR | Status: DC | PRN
  Administered 2016-08-07: 20 [IU]

## 2016-08-07 MED ORDER — OXYCODONE HCL 5 MG/5ML PO SOLN
5.0000 mg | Freq: Once | ORAL | Status: DC | PRN
  Filled 2016-08-07: qty 5

## 2016-08-07 MED ORDER — DEXAMETHASONE SODIUM PHOSPHATE 10 MG/ML IJ SOLN
INTRAMUSCULAR | Status: AC
  Filled 2016-08-07: qty 1

## 2016-08-07 MED ORDER — ONDANSETRON HCL 4 MG/2ML IJ SOLN
INTRAMUSCULAR | Status: DC | PRN
Start: 2016-08-07 — End: 2016-08-07
  Administered 2016-08-07: 4 mg via INTRAVENOUS

## 2016-08-07 MED ORDER — SODIUM CHLORIDE 0.9 % IJ SOLN
INTRAMUSCULAR | Status: DC | PRN
  Administered 2016-08-07: 100 mL

## 2016-08-07 MED ORDER — HYDROMORPHONE HCL 1 MG/ML IJ SOLN
0.2500 mg | INTRAMUSCULAR | Status: DC | PRN
  Administered 2016-08-07: 0.25 mg via INTRAVENOUS
  Administered 2016-08-07: 0.5 mg via INTRAVENOUS
  Administered 2016-08-07: 0.25 mg via INTRAVENOUS
  Filled 2016-08-07: qty 0.5

## 2016-08-07 MED ORDER — MIDAZOLAM HCL 2 MG/2ML IJ SOLN
INTRAMUSCULAR | Status: AC
  Filled 2016-08-07: qty 2

## 2016-08-07 MED ORDER — KETOROLAC TROMETHAMINE 30 MG/ML IJ SOLN
INTRAMUSCULAR | Status: AC
Start: 2016-08-07 — End: 2016-08-07
  Filled 2016-08-07: qty 1

## 2016-08-07 MED ORDER — OXYCODONE HCL 5 MG PO TABS
5.0000 mg | ORAL_TABLET | Freq: Once | ORAL | Status: DC | PRN
  Filled 2016-08-07: qty 1

## 2016-08-07 MED ORDER — PROMETHAZINE HCL 25 MG/ML IJ SOLN
6.2500 mg | INTRAMUSCULAR | Status: DC | PRN
  Filled 2016-08-07: qty 1

## 2016-08-07 MED ORDER — ONDANSETRON HCL 4 MG/2ML IJ SOLN
INTRAMUSCULAR | Status: AC
  Filled 2016-08-07: qty 2

## 2016-08-07 MED ORDER — PROPOFOL 10 MG/ML IV BOLUS
INTRAVENOUS | Status: DC | PRN
  Administered 2016-08-07: 170 mg via INTRAVENOUS
  Administered 2016-08-07: 30 mg via INTRAVENOUS
  Administered 2016-08-07: 50 mg via INTRAVENOUS

## 2016-08-07 MED ORDER — KETOROLAC TROMETHAMINE 30 MG/ML IJ SOLN
INTRAMUSCULAR | Status: DC | PRN
  Administered 2016-08-07: 30 mg via INTRAVENOUS

## 2016-08-07 MED ORDER — DEXAMETHASONE SODIUM PHOSPHATE 10 MG/ML IJ SOLN
INTRAMUSCULAR | Status: DC | PRN
  Administered 2016-08-07: 10 mg via INTRAVENOUS

## 2016-08-07 MED ORDER — FENTANYL CITRATE (PF) 100 MCG/2ML IJ SOLN
INTRAMUSCULAR | Status: DC | PRN
  Administered 2016-08-07 (×2): 25 ug via INTRAVENOUS
  Administered 2016-08-07: 50 ug via INTRAVENOUS

## 2016-08-07 MED ORDER — LACTATED RINGERS IV SOLN
INTRAVENOUS | Status: DC
  Administered 2016-08-07 (×2): via INTRAVENOUS
  Filled 2016-08-07: qty 1000

## 2016-08-07 MED ORDER — PROPOFOL 500 MG/50ML IV EMUL
INTRAVENOUS | Status: AC
  Filled 2016-08-07: qty 50

## 2016-08-07 MED ORDER — MIDAZOLAM HCL 5 MG/5ML IJ SOLN
INTRAMUSCULAR | Status: DC | PRN
  Administered 2016-08-07: 2 mg via INTRAVENOUS

## 2016-08-07 MED ORDER — PROPOFOL 10 MG/ML IV BOLUS
INTRAVENOUS | Status: AC
  Filled 2016-08-07: qty 20

## 2016-08-07 SURGICAL SUPPLY — 39 items
BIPOLAR CUTTING LOOP 21FR (ELECTRODE)
CANISTER SUCTION 2500CC (MISCELLANEOUS) ×3 IMPLANT
CATH ROBINSON RED A/P 16FR (CATHETERS) IMPLANT
CATH SILICONE 16FRX5CC (CATHETERS) ×3 IMPLANT
COVER BACK TABLE 60X90IN (DRAPES) ×3 IMPLANT
DEVICE MYOSURE LITE (MISCELLANEOUS) IMPLANT
DEVICE MYOSURE REACH (MISCELLANEOUS) IMPLANT
DILATOR CANAL MILEX (MISCELLANEOUS) IMPLANT
DRAPE HYSTEROSCOPY (DRAPE) ×3 IMPLANT
DRAPE LG THREE QUARTER DISP (DRAPES) ×3 IMPLANT
DRSG TELFA 3X8 NADH (GAUZE/BANDAGES/DRESSINGS) ×3 IMPLANT
ELECT REM PT RETURN 9FT ADLT (ELECTROSURGICAL) ×3
ELECTRODE REM PT RTRN 9FT ADLT (ELECTROSURGICAL) ×1 IMPLANT
GLOVE BIO SURGEON STRL SZ 6.5 (GLOVE) ×4 IMPLANT
GLOVE BIO SURGEONS STRL SZ 6.5 (GLOVE) ×2
GLOVE ECLIPSE 6.5 STRL STRAW (GLOVE) IMPLANT
GLOVE INDICATOR 7.0 STRL GRN (GLOVE) ×3 IMPLANT
GLOVE SURG SS PI 6.5 STRL IVOR (GLOVE) ×6 IMPLANT
GOWN STRL REUS W/ TWL LRG LVL3 (GOWN DISPOSABLE) ×1 IMPLANT
GOWN STRL REUS W/ TWL XL LVL3 (GOWN DISPOSABLE) ×1 IMPLANT
GOWN STRL REUS W/TWL LRG LVL3 (GOWN DISPOSABLE) ×2
GOWN STRL REUS W/TWL XL LVL3 (GOWN DISPOSABLE) ×2
IV NS IRRIG 3000ML ARTHROMATIC (IV SOLUTION) ×3 IMPLANT
KIT ROOM TURNOVER WOR (KITS) ×3 IMPLANT
LEGGING LITHOTOMY PAIR STRL (DRAPES) ×3 IMPLANT
LOOP CUTTING BIPOLAR 21FR (ELECTRODE) IMPLANT
MYOSURE XL FIBROID REM (MISCELLANEOUS) ×3
PACK BASIN DAY SURGERY FS (CUSTOM PROCEDURE TRAY) ×3 IMPLANT
PAD OB MATERNITY 4.3X12.25 (PERSONAL CARE ITEMS) ×3 IMPLANT
PAD PREP 24X48 CUFFED NSTRL (MISCELLANEOUS) ×3 IMPLANT
SEAL ROD LENS SCOPE MYOSURE (ABLATOR) ×3 IMPLANT
SYSTEM TISS REMOVAL MYSR XL RM (MISCELLANEOUS) ×1 IMPLANT
TOWEL OR 17X24 6PK STRL BLUE (TOWEL DISPOSABLE) ×6 IMPLANT
TRAY DSU PREP LF (CUSTOM PROCEDURE TRAY) ×3 IMPLANT
TUBE CONNECTING 12'X1/4 (SUCTIONS)
TUBE CONNECTING 12X1/4 (SUCTIONS) IMPLANT
TUBING AQUILEX INFLOW (TUBING) ×3 IMPLANT
TUBING AQUILEX OUTFLOW (TUBING) ×3 IMPLANT
WATER STERILE IRR 500ML POUR (IV SOLUTION) ×3 IMPLANT

## 2016-08-07 NOTE — H&P (Signed)
Lacey Lee is an 40 y.o. female G17 SAAF here for hysteroscopic resection of fibroids with IUD removal.  Pt has known hx of fibroids with menorrhagia.  She has already undergone a laparoscopic myomectomy and she is trying to maintain possible fertility.  She does have a Mirena IUD that is going to be removed today as well.  She and I have discussed possible IUD placement as well but she is aware I will more likely place this later after I see she is not have post operative bleeding.  Pertinent Gynecological History: Menses: irregular and heavy at times Bleeding: above Contraception: abstinence DES exposure: denies Blood transfusions: none Sexually transmitted diseases: no past history Previous GYN Procedures: myomectomy  Last mammogram: n/a Date: n/a Last pap: normal Date: neg with HR HPV OB History: G0, P0   Menstrual History: Patient's last menstrual period was 08/04/2016 (exact date).    Past Medical History:  Diagnosis Date  . Asthma    albuterol rescue inhaler-uses prn, exacerbated by pet dander  . History of ectopic pregnancy 1999   S/P RIGHT SALPINGECTOMY  . History of herpes simplex type 2 infection 2005  . History of uterine fibroid   . Iron deficiency anemia   . Menorrhagia   . Neurofibromatosis [facility])    dx is child--  nonmalignant  . Seasonal allergies   . Uterine fibroid     Past Surgical History:  Procedure Laterality Date  . LAPAROSCOPY FOR ECTOPIC PREGNANCY  2000   RIGHT SALPINGECTOMY  . ROBOT ASSISTED MYOMECTOMY N/A 12/16/2012   Procedure: ROBOTIC ASSISTED MYOMECTOMY;  Surgeon: Governor Specking, MD;  Location: [facility];  Service: Gynecology;  Laterality: N/A;  . ROBOTIC ASSISTED LAPAROSCOPIC LYSIS OF ADHESION  12/16/2012   Procedure: ROBOTIC ASSISTED LAPAROSCOPIC LYSIS OF ADHESION;  Surgeon: Governor Specking, MD;  Location: [facility];  Service: Gynecology;;  with excision of perotoneal lesions  . WISDOM TOOTH EXTRACTION      Family History  Problem Relation  Age of Onset  . Allergies Mother     food  . Hypertension Mother   . Hyperlipidemia Mother   . Heart failure Mother   . Thyroid disease Mother   . Diabetes Mother   . Lung cancer Father   . Diabetes Sister   . Hypertension Sister   . Uterine cancer Paternal Grandmother     Social History:  reports that she has never smoked. She has never used smokeless tobacco. She reports that she drinks alcohol. She reports that she does not use drugs.  Allergies:  Allergies  Allergen Reactions  . Latex Other (See Comments)    DERMITITIS  . Other Hives and Swelling    PORK (alphagal)-lip/tongue swelling, hives  . Tramadol Nausea And Vomiting    Prescriptions Prior to Admission  Medication Sig Dispense Refill Last Dose  . albuterol (PROVENTIL HFA;VENTOLIN HFA) 108 (90 BASE) MCG/ACT inhaler Inhale 2 puffs into the lungs every 6 (six) hours as needed for wheezing.    Past Month at Unknown time  . ALPRAZolam (XANAX) 0.25 MG tablet Take 0.25 mg by mouth 3 (three) times daily as needed.    08/06/2016 at Unknown time  . Biotin 5000 MCG CAPS Take 1 capsule by mouth daily.    Past Week at Unknown time  . buPROPion (WELLBUTRIN SR) 200 MG 12 hr tablet Take 200 mg by mouth 2 (two) times daily.    08/06/2016 at Unknown time  . busPIRone (BUSPAR) 5 MG tablet Take 5 mg by mouth every  morning.    08/06/2016 at Unknown time  . cetirizine (ZYRTEC) 10 MG tablet TK 1 T PO QD PRN  0 08/06/2016 at Unknown time  . Ferrous Fumarate (HEMOCYTE - 106 MG FE) 324 (106 Fe) MG TABS tablet Take 1 tablet by mouth daily.   08/06/2016 at Unknown time  . HYDROcodone-acetaminophen (NORCO/VICODIN) 5-325 MG tablet Take 1-2 tablets by mouth every 6 (six) hours as needed for moderate pain or severe pain. 20 tablet 0   . ibuprofen (ADVIL,MOTRIN) 800 MG tablet Take 1 tablet (800 mg total) by mouth every 6 (six) hours as needed. 30 tablet 0   . levonorgestrel (MIRENA) 20 MCG/24HR IUD 1 each by Intrauterine route once.   08/07/2016 at Unknown  time  . Naproxen Sodium (ALEVE PO) Take by mouth as needed.    Taking  . Omega-3 Fatty Acids (FISH OIL) 1200 MG CAPS Take 1 capsule by mouth daily.    Past Week at Unknown time  . Vitamin D, Ergocalciferol, (DRISDOL) 50000 units CAPS capsule Take 1 capsule (50,000 Units total) by mouth every 7 (seven) days. 12 capsule 0 Past Week at Unknown time  . EPIPEN 2-PAK 0.3 MG/0.3ML DEVI as needed (environmental allergies).    Unknown at Unknown time    Review of Systems  All other systems reviewed and are negative.   Blood pressure (!) 114/57, pulse 78, temperature 98.3 F (36.8 C), temperature source Oral, resp. rate 16, height 5\' 2"  (1.575 m), weight 191 lb (86.6 kg), last menstrual period 08/04/2016, SpO2 100 %. Physical Exam  Constitutional: She is oriented to person, place, and time. She appears well-developed and well-nourished.  Cardiovascular: Normal rate and regular rhythm.   Respiratory: Effort normal and breath sounds normal.  Neurological: She is alert and oriented to person, place, and time.  Skin: Skin is warm and dry.  Psychiatric: She has a normal mood and affect.    Results for orders placed or performed during the hospital encounter of 08/07/16 (from the past 24 hour(s))  Pregnancy, urine POC     Status: None   Collection Time: 08/07/16  7:38 AM  Result Value Ref Range   Preg Test, Ur NEGATIVE NEGATIVE  Hemoglobin-hemacue, POC     Status: Abnormal   Collection Time: 08/07/16  7:52 AM  Result Value Ref Range   Hemoglobin 10.3 (L) 12.0 - 15.0 g/dL    No results found.  Assessment/Plan: 40 yo G0 SAAF here for hysteroscopic fibroid resection with IUD removal.  Risks and benefits have been reviewed in the office.  She is here and ready to proceed.  All questions answered.  Lacey Lee 08/07/2016, 8:07 AM

## 2016-08-07 NOTE — Anesthesia Procedure Notes (Signed)
Procedure Name: LMA Insertion Date/Time: 08/07/2016 8:32 AM Performed by: Wanita Chamberlain Pre-anesthesia Checklist: Patient identified, Timeout performed, Emergency Drugs available, Suction available and Patient being monitored Patient Re-evaluated:Patient Re-evaluated prior to inductionOxygen Delivery Method: Circle system utilized Preoxygenation: Pre-oxygenation with 100% oxygen Intubation Type: IV induction Ventilation: Mask ventilation without difficulty LMA: LMA inserted LMA Size: 4.0 Number of attempts: 1 Placement Confirmation: breath sounds checked- equal and bilateral and positive ETCO2

## 2016-08-07 NOTE — Anesthesia Postprocedure Evaluation (Signed)
Anesthesia Post Note  Patient: Lacey Lee  Procedure(s) Performed: Procedure(s) (LRB): HYSTEROSCOPY WITH MYOSURE (N/A) INTRAUTERINE DEVICE (IUD) REMOVAL (N/A)  Patient location during evaluation: PACU Anesthesia Type: General Level of consciousness: awake and alert Pain management: pain level controlled Vital Signs Assessment: post-procedure vital signs reviewed and stable Respiratory status: spontaneous breathing, nonlabored ventilation, respiratory function stable and patient connected to nasal cannula oxygen Cardiovascular status: blood pressure returned to baseline and stable Postop Assessment: no signs of nausea or vomiting Anesthetic complications: no    Last Vitals:  Vitals:   08/07/16 0915 08/07/16 0930  BP: 132/71 129/73  Pulse: (!) 112 92  Resp: (!) 27 (!) 21  Temp: 36.4 C     Last Pain:  Vitals:   08/07/16 0930  TempSrc:   PainSc: 6                  Jacquese Cassarino S

## 2016-08-07 NOTE — Transfer of Care (Signed)
Immediate Anesthesia Transfer of Care Note Immediate Anesthesia Transfer of Care Note  Patient: Lacey Lee  Procedure(s) Performed: Procedure(s) (LRB): HYSTEROSCOPY WITH MYOSURE (N/A) INTRAUTERINE DEVICE (IUD) REMOVAL (N/A)  Patient Location: PACU  Anesthesia Type: General  Level of Consciousness: awake, oriented, sedated and patient cooperative  Airway & Oxygen Therapy: Patient Spontanous Breathing and Patient connected to face mask oxygen  Post-op Assessment: Report given to PACU RN and Post -op Vital signs reviewed and stable  Post vital signs: Reviewed and stable  Complications: No apparent anesthesia complications  Last Vitals:  Vitals:   08/07/16 0720 08/07/16 0915  BP: (!) 114/57 132/71  Pulse: 78 (!) 112  Resp: 16 (!) 27  Temp: 36.8 C 36.4 C

## 2016-08-07 NOTE — Anesthesia Preprocedure Evaluation (Addendum)
Anesthesia Evaluation  Patient identified by MRN, date of birth, ID band Patient awake    Reviewed: Allergy & Precautions, NPO status , Patient's Chart, lab work & pertinent test results  Airway Mallampati: II  TM Distance: >3 FB Neck ROM: Full    Dental no notable dental hx. (+) Teeth Intact, Dental Advisory Given   Pulmonary asthma ,    Pulmonary exam normal breath sounds clear to auscultation       Cardiovascular negative cardio ROS Normal cardiovascular exam Rhythm:Regular Rate:Normal     Neuro/Psych negative neurological ROS  negative psych ROS   GI/Hepatic negative GI ROS, Neg liver ROS,   Endo/Other  negative endocrine ROS  Renal/GU negative Renal ROS  negative genitourinary   Musculoskeletal negative musculoskeletal ROS (+)   Abdominal   Peds negative pediatric ROS (+)  Hematology negative hematology ROS (+) anemia ,   Anesthesia Other Findings   Reproductive/Obstetrics negative OB ROS                           Anesthesia Physical Anesthesia Plan  ASA: II  Anesthesia Plan: General   Post-op Pain Management:    Induction: Intravenous  Airway Management Planned: LMA  Additional Equipment:   Intra-op Plan:   Post-operative Plan: Extubation in OR  Informed Consent: I have reviewed the patients History and Physical, chart, labs and discussed the procedure including the risks, benefits and alternatives for the proposed anesthesia with the patient or authorized representative who has indicated his/her understanding and acceptance.   Dental advisory given  Plan Discussed with: CRNA and Surgeon  Anesthesia Plan Comments:         Anesthesia Quick Evaluation

## 2016-08-07 NOTE — Op Note (Signed)
08/07/2016  10:12 AM  PATIENT:  Lacey Lee  40 y.o. female  PRE-OPERATIVE DIAGNOSIS:  Menorrhagia, submucosal fibroids, intramural fibroids, anemia, Mirena IUD present  POST-OPERATIVE DIAGNOSIS:  Menorrhagia, submucosal fibroids, intramural fibroids, anemia, Mirena IUD present  PROCEDURE:  Procedure(s): HYSTEROSCOPIC MYOMECTOMY OF TWO SUBMUCOSAL FIBROIDS WITH MYOSURE INTRAUTERINE DEVICE (IUD) REMOVAL  SURGEON:  Mignon Bechler [name]  ASSISTANTS: OR staff   ANESTHESIA:   general  ESTIMATED BLOOD LOSS: 25cc  BLOOD ADMINISTERED:none   FLUIDS: 800ccLR  UOP: 40cc drained with I&O cath at beginning of procedure  SPECIMEN:  Myomas  DISPOSITION OF SPECIMEN:  PATHOLOGY  FINDINGS: two submucosal fibroids with thin endometrium  DESCRIPTION OF OPERATION: Patient was taken to the operating room.  She is placed in the supine position. SCDs were on her lower extremities and functioning properly. General anesthesia with an LMA was administered without difficulty. Dr. Kalman Shan oversaw case.  Legs were then placed in the [place] in the low lithotomy position. The legs were lifted to the high lithotomy position and the Betadine prep was used on the inner thighs perineum and vagina x3. Patient was draped in a normal standard fashion. An in and out catheterization with a red rubber Foley catheter was performed. Approximately 40 cc of clear urine was noted. A bivalve speculum was placed the vagina. The anterior lip of the cervix was grasped with single-tooth tenaculum.  The IUD string was noted and grasped with ringed forceps.  This was removed without difficulty with one pull.  Then pitressin was mixed 20unites in 100cc NS.  20cc (4 units) were placed at the 3 and 9 o'clock positions (10cc at each site).  The cervix is dilated up to #23 [facility] dilators. The endometrial cavity sounded to 7.5 cm.  A 7 Myosure hysteroscope was obtained.  NS was used as the hysteroscopic fluid for the procedure.   The hysteroscope was advanced through the endocervical canal into the endometrial cavity.  Tubal ostia were noted bilaterally.  Two fibroids were noted, one to the right and one to the left.  Using the [facility] device, these were resected until the cavity had a smooth contour. The pressure was decreased and no significant bleeding was noted.  Photodocumentation was made.  No curettage was performed.  The fluid deficit was 700 cc. The tenaculum was removed from the anterior lip of the cervix.  I watched for bleeding for several minutes and manipulated the uterus.  Bleeding was not significant.  The speculum was removed from the vagina. The prep was cleansed of the patient's skin. The legs are positioned back in the supine position. Sponge, lap, needle, initially counts were correct x2. Patient was taken to recovery in stable condition.  COUNTS:  YES  PLAN OF CARE: Transfer to PACU

## 2016-08-07 NOTE — Discharge Instructions (Signed)
°  Post Anesthesia Home Care Instructions  Activity: Get plenty of rest for the remainder of the day. A responsible adult should stay with you for 24 hours following the procedure.  For the next 24 hours, DO NOT: -Drive a car -Paediatric nurse -Drink alcoholic beverages -Take any medication unless instructed by your physician -Make any legal decisions or sign important papers.  Meals: Start with liquid foods such as gelatin or soup. Progress to regular foods as tolerated. Avoid greasy, spicy, heavy foods. If nausea and/or vomiting occur, drink only clear liquids until the nausea and/or vomiting subsides. Call your physician if vomiting continues.  Special Instructions/Symptoms: Your throat may feel dry or sore from the anesthesia or the breathing tube placed in your throat during surgery. If this causes discomfort, gargle with warm salt water. The discomfort should disappear within 24 hours.  If you had a scopolamine patch placed behind your ear for the management of post- operative nausea and/or vomiting:  1. The medication in the patch is effective for 72 hours, after which it should be removed.  Wrap patch in a tissue and discard in the trash. Wash hands thoroughly with soap and water. 2. You may remove the patch earlier than 72 hours if you experience unpleasant side effects which may include dry mouth, dizziness or visual disturbances. 3. Avoid touching the patch. Wash your hands with soap and water after contact with the patch.     D & C Home care Instructions:   Personal hygiene:  Used sanitary napkins for vaginal drainage not tampons. Shower  the day after your procedure. May tub bathe in one week. No douching until bleeding stops. Always wipe from front to back after  Elimination.  Activity: Do not drive or operate any equipment today. The effects of the anesthesia are still present and drowsiness may result. Rest today, not necessarily flat bed rest, just take it easy. You may  resume your normal activity in one to 2 days.  Sexual activity: No intercourse for 2 weeks or as indicated by your physician  Diet: Eat a light diet as desired this evening. You may resume a regular diet tomorrow.  Return to work: One to 2 days.  General Expectations of your surgery: Vaginal bleeding should be no heavier than a normal period. Spotting may continue up to 10 days. Mild cramps may continue for a couple of days. You may have a regular period in 2-6 weeks.  Unexpected observations call your doctor if these occur: persistent or heavy bleeding. Severe abdominal cramping or pain. Elevation of temperature greater than 100.85F.   F/U appointment 11/28 @3  p.m.

## 2016-08-10 ENCOUNTER — Encounter (HOSPITAL_BASED_OUTPATIENT_CLINIC_OR_DEPARTMENT_OTHER): Payer: Self-pay | Admitting: Obstetrics & Gynecology

## 2016-08-24 ENCOUNTER — Telehealth: Payer: Self-pay | Admitting: *Deleted

## 2016-08-24 DIAGNOSIS — D259 Leiomyoma of uterus, unspecified: Secondary | ICD-10-CM

## 2016-08-24 NOTE — Telephone Encounter (Signed)
Call to patient. Per Dr Sabra Heck, would like to add pelvic ultrasound to appointment tomorrow.Appointment added for 230. Call to patient to notify, left message to call back.

## 2016-08-24 NOTE — Telephone Encounter (Signed)
Patient returned call. Advised of Dr Ammie Ferrier recommendation for follow-up ultrasound. Agreeable to appointment at 230 tomorrow.  Routing to provider for final review. Patient agreeable to disposition. Will close encounter.

## 2016-08-25 ENCOUNTER — Encounter: Payer: Self-pay | Admitting: Obstetrics & Gynecology

## 2016-08-25 ENCOUNTER — Other Ambulatory Visit: Payer: Self-pay | Admitting: Obstetrics & Gynecology

## 2016-08-25 ENCOUNTER — Ambulatory Visit: Payer: BLUE CROSS/BLUE SHIELD | Admitting: Obstetrics & Gynecology

## 2016-08-25 ENCOUNTER — Ambulatory Visit (INDEPENDENT_AMBULATORY_CARE_PROVIDER_SITE_OTHER): Payer: BLUE CROSS/BLUE SHIELD | Admitting: Obstetrics & Gynecology

## 2016-08-25 ENCOUNTER — Ambulatory Visit (INDEPENDENT_AMBULATORY_CARE_PROVIDER_SITE_OTHER): Payer: BLUE CROSS/BLUE SHIELD

## 2016-08-25 VITALS — BP 124/78 | HR 70 | Resp 16 | Ht 61.0 in | Wt 190.2 lb

## 2016-08-25 DIAGNOSIS — D500 Iron deficiency anemia secondary to blood loss (chronic): Secondary | ICD-10-CM

## 2016-08-25 DIAGNOSIS — Z30430 Encounter for insertion of intrauterine contraceptive device: Secondary | ICD-10-CM

## 2016-08-25 DIAGNOSIS — D251 Intramural leiomyoma of uterus: Secondary | ICD-10-CM

## 2016-08-25 DIAGNOSIS — N921 Excessive and frequent menstruation with irregular cycle: Secondary | ICD-10-CM

## 2016-08-25 DIAGNOSIS — D259 Leiomyoma of uterus, unspecified: Secondary | ICD-10-CM

## 2016-08-25 MED ORDER — FLUCONAZOLE 150 MG PO TABS
150.0000 mg | ORAL_TABLET | Freq: Once | ORAL | 0 refills | Status: AC
Start: 2016-08-25 — End: 2016-08-25

## 2016-08-25 NOTE — Progress Notes (Signed)
40 y.o. G55P0020 Single African American female presents for insertion of Mirena IUD after having hysteroscopic resection of fibroid.  This was not done in the OR, as it was difficult to determine exactly how deep the resection went.  Pt and I discussed this both before and after surgery and we decided to proceed with placement after procedure.  She has not had any bleeding since the myomectomy.  Also, has not been SA.  We have discussed alternative options for treatment.  She is not interested in hysterectomy at this time.  She has failed OCP and POP use.   Pt has also been counseled about risks and benefits as well as complications.  Consent is obtained today.  All questions answered prior to start of procedure.    Current contraception: none  Ultrasound findings: Uterus 9.0 x 5.5 x 4.5cm with 2.3cm x 2.0cm, 1.4 x 1.0cm, 1.8 x 1.6cm, 1.4 x 1.2cm, and 1.3 x 1.2cm fibroi Endometrium 1.35mm, slightly distorted by cnetral fibroids Left ovary 3.0 x 2.5 x 1.9cm Right ovary 3.4 x 1.6 x 1.9cm  LMP:  Patient's last menstrual period was 08/04/2016 (exact date).  Patient Active Problem List   Diagnosis Date Noted  . Fibroids 11/22/2013  . Endometriosis 01/06/2013  . Seasonal and perennial allergic rhinitis 05/29/2012  . Asthma, mild intermittent 05/29/2012   Past Medical History:  Diagnosis Date  . Asthma    albuterol rescue inhaler-uses prn, exacerbated by pet dander  . History of ectopic pregnancy 1999   S/P RIGHT SALPINGECTOMY  . History of herpes simplex type 2 infection 2005  . History of uterine fibroid   . Iron deficiency anemia   . Menorrhagia   . Neurofibromatosis [facility])    dx is child--  nonmalignant  . Seasonal allergies   . Uterine fibroid    Current Outpatient Prescriptions on File Prior to Visit  Medication Sig Dispense Refill  . albuterol (PROVENTIL HFA;VENTOLIN HFA) 108 (90 BASE) MCG/ACT inhaler Inhale 2 puffs into the lungs every 6 (six) hours as needed for wheezing.     Marland Kitchen  ALPRAZolam (XANAX) 0.25 MG tablet Take 0.25 mg by mouth 3 (three) times daily as needed.     . Biotin 5000 MCG CAPS Take 1 capsule by mouth daily.     Marland Kitchen buPROPion (WELLBUTRIN SR) 200 MG 12 hr tablet Take 200 mg by mouth 2 (two) times daily.     . busPIRone (BUSPAR) 5 MG tablet Take 5 mg by mouth every morning.     . cetirizine (ZYRTEC) 10 MG tablet TK 1 T PO QD PRN  0  . EPIPEN 2-PAK 0.3 MG/0.3ML DEVI as needed (environmental allergies).     . Ferrous Fumarate (HEMOCYTE - 106 MG FE) 324 (106 Fe) MG TABS tablet Take 1 tablet by mouth daily.    Marland Kitchen ibuprofen (ADVIL,MOTRIN) 800 MG tablet Take 1 tablet (800 mg total) by mouth every 6 (six) hours as needed. 30 tablet 0  . Naproxen Sodium (ALEVE PO) Take by mouth as needed.     . Omega-3 Fatty Acids (FISH OIL) 1200 MG CAPS Take 1 capsule by mouth daily.     . Vitamin D, Ergocalciferol, (DRISDOL) 50000 units CAPS capsule Take 1 capsule (50,000 Units total) by mouth every 7 (seven) days. 12 capsule 0   No current facility-administered medications on file prior to visit.    Latex; Other; and Tramadol  Review of Systems  All other systems reviewed and are negative.    Vitals:   08/25/16  1532  BP: 124/78  Pulse: 70  Resp: 16  Weight: 190 lb 3.2 oz (86.3 kg)  Height: 5\' 1"  (1.549 m)    Gen:  WNWF healthy female NAD Abdomen: soft, non-tender Groin:  no inguinal nodes palpated  Pelvic exam: Vulva:  normal female genitalia Vagina:  normal vagina Cervix:  Non-tender, Negative CMT, no lesions or redness. Uterus:  About 10cm and irregular   Procedure:  Speculum reinserted.  Cervix visualized and cleansed with Betadine x 3.  Paracervical block was not placed.  Single toothed tenaculum applied to anterior lip of cervix without difficulty.  Uterus sounded to 8cm.  Lot number: [id number].  Expiration:  4/20.  IUD package was opened.  IUD and introducer passed to fundus and then withdrawn slightly before IUD was passed into endometrial cavity.  Introducer  removed.  Strings cut to 2cm.  Tenaculum removed from cervix.  Minimal bleeding noted.  Pt tolerated the procedure well.  All instruments removed from vagina.  Post placement PUS showed IUD was in correct location within cavity but both arms are towards right cornua  A: Insertion of Mirena IUD Fibroid uterus Contraception desires Menorrhagia  P:  Return for recheck 6-8 weeks Pt aware to call for any concerns Pt aware removal due no later than 08/25/17.  IUD card given to pt.

## 2016-08-27 DIAGNOSIS — D500 Iron deficiency anemia secondary to blood loss (chronic): Secondary | ICD-10-CM | POA: Insufficient documentation

## 2016-08-30 NOTE — Progress Notes (Signed)
[name] [address] [place], [place] [address] Phone: [phone] Subjective:    CC: left hip pain   [id number]  Lacey Lee is a 40 y.o. female coming in with complaint of left hip pain. Patient states that this is been getting worse over the course of the last several weeks. Does not rib or any true injury. States that it worse after sitting for long amount of time. Patient states that there is some very mild pain. Patient states that seems to stay on the lateral aspect of the hip. Mild radiation down the leg. No significant weakness compared to the contralateral side. No nighttime awakening but states sitting or laying for long amount time patient has significant stiffer minutes before it seems to loosen up. Patient rates the severity of pain a 7 out of 10. Not responding to the over-the-counter medications anymore.     Past Medical History:  Diagnosis Date  . Asthma    albuterol rescue inhaler-uses prn, exacerbated by pet dander  . History of ectopic pregnancy 1999   S/P RIGHT SALPINGECTOMY  . History of herpes simplex type 2 infection 2005  . History of uterine fibroid   . Iron deficiency anemia   . Menorrhagia   . Neurofibromatosis [facility])    dx is child--  nonmalignant  . Seasonal allergies   . Uterine fibroid    Past Surgical History:  Procedure Laterality Date  . DILATATION & CURETTAGE/HYSTEROSCOPY WITH MYOSURE N/A 08/07/2016   Procedure: HYSTEROSCOPY WITH MYOSURE;  Surgeon: Megan Salon, MD;  Location: [facility];  Service: Gynecology;  Laterality: N/A;  Fibroid resection  . IUD REMOVAL N/A 08/07/2016   Procedure: INTRAUTERINE DEVICE (IUD) REMOVAL;  Surgeon: Megan Salon, MD;  [facility];  Service: Gynecology;  Laterality: N/A;  . LAPAROSCOPY FOR ECTOPIC PREGNANCY  2000   RIGHT SALPINGECTOMY  . ROBOT ASSISTED MYOMECTOMY N/A 12/16/2012   Procedure: ROBOTIC ASSISTED MYOMECTOMY;  Surgeon:  Governor Specking, MD;  Location: [place] [facility];  Service: Gynecology;  Laterality: N/A;  . ROBOTIC ASSISTED LAPAROSCOPIC LYSIS OF ADHESION  12/16/2012   Procedure: ROBOTIC ASSISTED LAPAROSCOPIC LYSIS OF ADHESION;  Surgeon: Governor Specking, MD;  Location: [place] [facility];  Service: Gynecology;;  with excision of perotoneal lesions  . WISDOM TOOTH EXTRACTION     Social History   Social History  . Marital status: Single    Spouse name: N/A  . Number of children: 0  . Years of education: N/A   Occupational History  . 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 Smoking status: Never Smoker  . Smokeless tobacco: Never Used  . Alcohol use 0.0 oz/week     Comment: OCCASIONAL  . Drug use: No  . Sexual activity: Yes    Partners: Male    Birth control/ protection: IUD     Comment: Mirena IUD placed  08-25-16   Other Topics Concern  . None   Social History Narrative  . None   Allergies  Allergen Reactions  . Latex Other (See Comments)    DERMITITIS  . Other Hives and Swelling    PORK (alphagal)-lip/tongue swelling, hives  . Tramadol Nausea And Vomiting   Family History  Problem Relation Age of Onset  . Allergies Mother     food  . Hypertension Mother   . Hyperlipidemia Mother   . Heart failure Mother   . Thyroid disease Mother   .  Diabetes Mother   . Lung cancer Father   . Diabetes Sister   . Hypertension Sister   . Uterine cancer Paternal Grandmother     Past medical history, social, surgical and family history all reviewed in electronic medical record.  No pertanent information unless stated regarding to the chief complaint.   Review of Systems:Review of systems updated and as accurate as of 08/31/16  No headache, visual changes, nausea, vomiting, diarrhea, constipation, dizziness, abdominal pain, skin rash, fevers, chills, night sweats, weight loss, swollen lymph nodes, body aches, joint swelling, muscle aches, chest pain, shortness of breath, mood  changes.   Objective  Blood pressure 120/72, pulse 69, height 5\' 1"  (1.549 m), weight 190 lb (86.2 kg), last menstrual period 08/04/2016, SpO2 99 %. Systems examined below as of 08/31/16   General: No apparent distress alert and oriented x3 mood and affect normal, dressed appropriately.  HEENT: Pupils equal, extraocular movements intact  Respiratory: Patient's speak in full sentences and does not appear short of breath  Cardiovascular: No lower extremity edema, non tender, no erythema  Skin: Warm dry intact with no signs of infection or rash on extremities or on axial skeleton.  Abdomen: Soft nontender  Neuro: Cranial nerves II through XII are intact, neurovascularly intact in all extremities with 2+ DTRs and 2+ pulses.  Lymph: No lymphadenopathy of posterior or anterior cervical chain or axillae bilaterally.  Gait Mild antalgic gait MSK:  Non tender with full range of motion and good stability and symmetric strength and tone of shoulders, elbows, wrist, knee and ankles bilaterally. Hip: Left ROM IR: 25 Deg, ER: 25 Deg, Flexion: 120 Deg, Extension: 100 Deg, Abduction: 45 Deg, Adduction: 25 Deg Strength IR: 5/5, ER: 5/5, Flexion: 5/5, Extension: 5/5, Abduction: 5/5, Adduction: 5/5 Pelvic alignment unremarkable to inspection and palpation. Standing hip rotation and gait without trendelenburg sign / unsteadiness. Severe tenderness over the greater chart and tear to area. Positive Faber Moderate tenderness of the left sacroiliac joint Contralateral hip unremarkable Negative straight leg test  Procedure note 97110; 15 minutes spent for Therapeutic exercises as stated in above notes.  This included exercises focusing on stretching, strengthening, with significant focus on eccentric aspects. Hip strengthening exercises which included:  Pelvic tilt/bracing to help with proper recruitment of the lower abs and pelvic floor muscles  Glute strengthening to properly contract glutes without  over-engaging low back and hamstrings - prone hip extension and glute bridge exercises Proper stretching techniques to increase effectiveness for the hip flexors, groin, quads, piriformic and low back when appropriate     Proper technique shown and discussed handout in great detail with ATC.  All questions were discussed and answered.     Impression and Recommendations:     This case required medical decision making of moderate complexity.      Note: This dictation was prepared with Dragon dictation along with smaller phrase technology. Any transcriptional errors that result from this process are unintentional.

## 2016-08-31 ENCOUNTER — Ambulatory Visit (INDEPENDENT_AMBULATORY_CARE_PROVIDER_SITE_OTHER)
Admission: RE | Admit: 2016-08-31 | Discharge: 2016-08-31 | Disposition: A | Discharge status: Home / Self Care | Payer: BLUE CROSS/BLUE SHIELD | Source: Ambulatory Visit | Attending: Family Medicine | Admitting: Family Medicine

## 2016-08-31 ENCOUNTER — Encounter: Payer: Self-pay | Admitting: Family Medicine

## 2016-08-31 ENCOUNTER — Ambulatory Visit (INDEPENDENT_AMBULATORY_CARE_PROVIDER_SITE_OTHER): Payer: BLUE CROSS/BLUE SHIELD | Admitting: Family Medicine

## 2016-08-31 VITALS — BP 120/72 | HR 69 | Ht 61.0 in | Wt 190.0 lb

## 2016-08-31 DIAGNOSIS — M7062 Trochanteric bursitis, left hip: Secondary | ICD-10-CM

## 2016-08-31 DIAGNOSIS — M5416 Radiculopathy, lumbar region: Secondary | ICD-10-CM

## 2016-08-31 MED ORDER — DICLOFENAC SODIUM 2 % TD SOLN: TRANSDERMAL | 3 refills | Status: DC

## 2016-08-31 NOTE — Patient Instructions (Signed)
Good to see you  Lacey Lee is your friend. Ice 20 minutes 2 times daily. Usually after activity and before bed. Stay active.  Exercises 3 times a week.  pennsaid pinkie amount topically 2 times daily as needed.  Good shoes with rigid bottom.  Lacey Lee, Merrell or New balance greater then 700 Vitamin D 2000 IU daily  See me again in 3-4 weeks and if not better we will consider injection.  Happy holidays!

## 2016-08-31 NOTE — Assessment & Plan Note (Signed)
Home exercise, icing protocol, topical anti-inflammatory's prescribed. We discussed proper shoes. Patient is going to try this conservative therapy. Follow-up if no improvement consider injection as well as formal physical therapy. Need to rule out any lumbar radiculopathy.

## 2016-09-17 ENCOUNTER — Encounter: Payer: Self-pay | Admitting: Obstetrics & Gynecology

## 2016-09-18 ENCOUNTER — Other Ambulatory Visit: Payer: Self-pay | Admitting: Obstetrics & Gynecology

## 2016-09-18 MED ORDER — FLUCONAZOLE 150 MG PO TABS: 150.0000 mg | ORAL_TABLET | Freq: Once | ORAL | 0 refills | Status: DC

## 2016-09-18 MED ORDER — FLUCONAZOLE 150 MG PO TABS
150.0000 mg | ORAL_TABLET | Freq: Once | ORAL | 0 refills | Status: AC
Start: 2016-09-18 — End: 2016-09-18

## 2016-09-29 ENCOUNTER — Ambulatory Visit: Payer: BLUE CROSS/BLUE SHIELD | Admitting: Family Medicine

## 2016-10-07 NOTE — Addendum Note (Signed)
Addended by: Katherina Right D on: 10/07/2016 03:52 PM   Modules accepted: Orders

## 2016-10-09 ENCOUNTER — Ambulatory Visit (INDEPENDENT_AMBULATORY_CARE_PROVIDER_SITE_OTHER): Payer: BLUE CROSS/BLUE SHIELD | Admitting: Obstetrics & Gynecology

## 2016-10-09 ENCOUNTER — Encounter: Payer: Self-pay | Admitting: Obstetrics & Gynecology

## 2016-10-09 VITALS — BP 126/78 | HR 84 | Resp 14 | Wt 192.0 lb

## 2016-10-09 DIAGNOSIS — D251 Intramural leiomyoma of uterus: Secondary | ICD-10-CM | POA: Diagnosis not present

## 2016-10-09 DIAGNOSIS — N898 Other specified noninflammatory disorders of vagina: Secondary | ICD-10-CM

## 2016-10-09 DIAGNOSIS — Z975 Presence of (intrauterine) contraceptive device: Secondary | ICD-10-CM

## 2016-10-09 DIAGNOSIS — N920 Excessive and frequent menstruation with regular cycle: Secondary | ICD-10-CM | POA: Diagnosis not present

## 2016-10-09 MED ORDER — IBUPROFEN 800 MG PO TABS: 800.0000 mg | ORAL_TABLET | Freq: Four times a day (QID) | ORAL | 1 refills | Status: DC | PRN

## 2016-10-09 NOTE — Progress Notes (Signed)
GYNECOLOGY  VISIT   HPI: 41 y.o. G3P0020 Single African American female with uterine fibroids and menorrhagia here for recheck after IUD placement and to discuss bleeding.  Pt reports last cycle lasted 9 days with the first 2-3 being "awful" with heavy bleeding and cramping and clotting.  Pt very tearful today.  States "I never thought I would be childless" but she is very seriously considering hysterectomy so she can just stop having so many terrible days each month.  We spent time discussing the procedure, recovery, time out of work, work limitations.  She is not ready to make this decision yet.  She is going to wait another month or two but states once she decides, she will want to move quickly with this.  Pt would like testing for yeast.  Feels she is having recurrent issues with this.  Not itching today but feels like she "might be on the verge" of having symptoms.  GYNECOLOGIC HISTORY: Patient's last menstrual period was 09/24/2016. Contraception: IUD  Patient Active Problem List   Diagnosis Date Noted  . Greater trochanteric bursitis of left hip 08/31/2016  . Anemia due to chronic blood loss 08/27/2016  . Fibroids 11/22/2013  . Endometriosis 01/06/2013  . Seasonal and perennial allergic rhinitis 05/29/2012  . Asthma, mild intermittent 05/29/2012    Past Medical History:  Diagnosis Date  . Asthma    albuterol rescue inhaler-uses prn, exacerbated by pet dander  . History of ectopic pregnancy 1999   S/P RIGHT SALPINGECTOMY  . History of herpes simplex type 2 infection 2005  . History of uterine fibroid   . Iron deficiency anemia   . Menorrhagia   . Neurofibromatosis [facility])    dx is child--  nonmalignant  . Seasonal allergies   . Uterine fibroid     Past Surgical History:  Procedure Laterality Date  . DILATATION & CURETTAGE/HYSTEROSCOPY WITH MYOSURE N/A 08/07/2016   Procedure: HYSTEROSCOPY WITH MYOSURE;  Surgeon: Megan Salon, MD;  Location[facility];   Service: Gynecology;  Laterality: N/A;  Fibroid resection  . IUD REMOVAL N/A 08/07/2016   Procedure: INTRAUTERINE DEVICE (IUD) REMOVAL;  Surgeon: Megan Salon, MD;  Location[facility];  Service: Gynecology;  Laterality: N/A;  . LAPAROSCOPY FOR ECTOPIC PREGNANCY  2000   RIGHT SALPINGECTOMY  . ROBOT ASSISTED MYOMECTOMY N/A 12/16/2012   Procedure: ROBOTIC ASSISTED MYOMECTOMY;  Surgeon: Governor Specking, MD;  Location: [place] [facility];  Service: Gynecology;  Laterality: N/A;  . ROBOTIC ASSISTED LAPAROSCOPIC LYSIS OF ADHESION  12/16/2012   Procedure: ROBOTIC ASSISTED LAPAROSCOPIC LYSIS OF ADHESION;  Surgeon: Governor Specking, MD;  Location: [place] [facility];  Service: Gynecology;;  with excision of perotoneal lesions  . WISDOM TOOTH EXTRACTION      MEDS:  Reviewed in EPIC and UTD  ALLERGIES: Latex; Other; and Tramadol  Family History  Problem Relation Age of Onset  . Allergies Mother     food  . Hypertension Mother   . Hyperlipidemia Mother   . Heart failure Mother   . Thyroid disease Mother   . Diabetes Mother   . Lung cancer Father   . Diabetes Sister   . Hypertension Sister   . Uterine cancer Paternal Grandmother    SH:  Single, non smoker  Review of Systems  All other systems reviewed and are negative.   PHYSICAL EXAMINATION:    BP 126/78 (BP Location: Right Arm, Patient Position: Sitting, Cuff Size: Normal)   Pulse 84   Resp 14  Wt 192 lb (87.1 kg)   LMP 09/24/2016   BMI 36.28 kg/m     General appearance: alert, cooperative and appears stated age Abdomen: soft, non-tender; bowel sounds normal; no masses,  no organomegaly  Pelvic: External genitalia:  no lesions              Urethra:  normal appearing urethra with no masses, tenderness or lesions              Bartholins and Skenes: normal                 Vagina: normal appearing vagina with normal color and discharge, no lesions              Cervix: no lesions and IUD string noted              Bimanual Exam:   Uterus:  enlarged, 10-12 blogular c/w fibroids weeks size              Adnexa: no mass, fullness, tenderness              Anus:  no lesions  Chaperone was present for exam.  Assessment: Menorrhagia due to uterine fibroids IUD in place Possible recurrent yeast vaginitis  Plan: Affirm pending.  Results will be communicated to pt. Pt is going to wait through another cycle or two but is seriously contemplating hysterectomy.   ~30 minutes spent with patient >50% of time was in face to face discussion of above.

## 2016-10-10 LAB — WET PREP BY MOLECULAR PROBE
Candida species: NEGATIVE
GARDNERELLA VAGINALIS: NEGATIVE
Trichomonas vaginosis: NEGATIVE

## 2016-11-23 ENCOUNTER — Encounter: Payer: Self-pay | Admitting: Nurse Practitioner

## 2016-11-23 NOTE — Progress Notes (Signed)
Patient ID: Lacey Lee, female   DOB: 1976/07/12, 41 y.o.   MRN: [id number]  41 y.o. G2P0020 Single  African American Fe here for annual exam. Since Mirena IUD 08/25/16 she has had cycle in December that was heavy, lasting ~ 9 days with cramps, in January moderate flow and no cramps and lasted ~ 9 days.  This months heavy for 5 days and bled longer at 9-10, no cramps   Now ready for other surgical intervention.  Tired of bleeding and wants to have surgery in ~ June.  Same partner.  Patient's last menstrual period was 11/14/2016 (exact date).          Sexually active: Yes.    The current method of family planning is IUD.  Mirena placed 08/25/16. Exercising: Yes.    patient is walking and plans to do more starting in March. Smoker:  no  Health Maintenance: Pap: 11/19/15, Negative with neg HR HPV  11/13/14, Negative with neg HR HPV  11/05/11, Negative (remote + HR HPV with negative # 16 & 18 2006 & 2007) TDaP: 2012 HIV: 11/19/15 Labs: Fasting labs today   reports that she has never smoked. She has never used smokeless tobacco. She reports that she drinks alcohol. She reports that she does not use drugs.  Past Medical History:  Diagnosis Date  . Asthma    albuterol rescue inhaler-uses prn, exacerbated by pet dander  . History of ectopic pregnancy 1999   S/P RIGHT SALPINGECTOMY  . History of herpes simplex type 2 infection 2005  . History of uterine fibroid   . Iron deficiency anemia   . Menorrhagia   . Neurofibromatosis [facility])    dx is child--  nonmalignant  . Seasonal allergies   . Uterine fibroid     Past Surgical History:  Procedure Laterality Date  . DILATATION & CURETTAGE/HYSTEROSCOPY WITH MYOSURE N/A 08/07/2016   Procedure: HYSTEROSCOPY WITH MYOSURE;  Surgeon: Megan Salon, MD;  Location: [facility];  Service: Gynecology;  Laterality: N/A;  Fibroid resection  . IUD REMOVAL N/A 08/07/2016   Procedure: INTRAUTERINE DEVICE (IUD) REMOVAL;  Surgeon: Megan Salon,  MD;  Location[facility];  Service: Gynecology;  Laterality: N/A;  . LAPAROSCOPY FOR ECTOPIC PREGNANCY  2000   RIGHT SALPINGECTOMY  . ROBOT ASSISTED MYOMECTOMY N/A 12/16/2012   Procedure: ROBOTIC ASSISTED MYOMECTOMY;  Surgeon: Governor Specking, MD;  Location: [place] [facility];  Service: Gynecology;  Laterality: N/A;  . ROBOTIC ASSISTED LAPAROSCOPIC LYSIS OF ADHESION  12/16/2012   Procedure: ROBOTIC ASSISTED LAPAROSCOPIC LYSIS OF ADHESION;  Surgeon: Governor Specking, MD;  Location: [place] [facility];  Service: Gynecology;;  with excision of perotoneal lesions  . WISDOM TOOTH EXTRACTION      Current Outpatient Prescriptions  Medication Sig Dispense Refill  . albuterol (PROVENTIL HFA;VENTOLIN HFA) 108 (90 BASE) MCG/ACT inhaler Inhale 2 puffs into the lungs every 6 (six) hours as needed for wheezing.     Marland Kitchen ALPRAZolam (XANAX) 0.25 MG tablet Take 0.25 mg by mouth 3 (three) times daily as needed.     Marland Kitchen buPROPion (WELLBUTRIN SR) 200 MG 12 hr tablet Take 200 mg by mouth 2 (two) times daily.     . busPIRone (BUSPAR) 5 MG tablet Take 5 mg by mouth every morning.     . cetirizine (ZYRTEC) 10 MG tablet TK 1 T PO QD PRN  0  . EPIPEN 2-PAK 0.3 MG/0.3ML DEVI as needed (environmental allergies).     Marland Kitchen ibuprofen (ADVIL,MOTRIN) 800 MG tablet  Take 1 tablet (800 mg total) by mouth every 6 (six) hours as needed. 30 tablet 1  . MIRENA, 52 MG, 20 MCG/24HR IUD     . Naproxen Sodium (ALEVE PO) Take by mouth as needed.     . Ferrous Fumarate (HEMOCYTE - 106 MG FE) 324 (106 Fe) MG TABS tablet Take 1 tablet by mouth daily.    . Omega-3 Fatty Acids (FISH OIL) 1200 MG CAPS Take 1 capsule by mouth daily.     . Vitamin D, Ergocalciferol, (DRISDOL) 50000 units CAPS capsule Take 1 capsule (50,000 Units total) by mouth every 7 (seven) days. 30 capsule 3   No current facility-administered medications for this visit.     Family History  Problem Relation Age of Onset  . Allergies Mother     food  . Hypertension Mother   .  Hyperlipidemia Mother   . Heart failure Mother   . Thyroid disease Mother   . Diabetes Mother   . Lung cancer Father   . Diabetes Sister   . Hypertension Sister   . Uterine cancer Paternal Grandmother     ROS:  Pertinent items are noted in HPI.  Otherwise, a comprehensive ROS was negative.  Exam:   BP 120/76 (BP Location: Right Arm, Patient Position: Sitting, Cuff Size: Normal)   Pulse 72   Ht 5' 1.75" (1.568 m)   Wt 189 lb (85.7 kg)   LMP 11/14/2016 (Exact Date)   BMI 34.85 kg/m  Height: 5' 1.75" (156.8 cm) Ht Readings from Last 3 Encounters:  11/24/16 5' 1.75" (1.568 m)  08/31/16 5\' 1"  (1.549 m)  08/25/16 5\' 1"  (1.549 m)    General appearance: alert, cooperative and appears stated age Head: Normocephalic, without obvious abnormality, atraumatic Neck: no adenopathy, supple, symmetrical, trachea midline and thyroid normal to inspection and palpation Lungs: clear to auscultation bilaterally Breasts: normal appearance, no masses or tenderness Heart: regular rate and rhythm Abdomen: soft, non-tender; no masses,  no organomegaly Extremities: extremities normal, atraumatic, no cyanosis or edema Skin: Skin color, texture, turgor normal. No rashes or lesions Lymph nodes: Cervical, supraclavicular, and axillary nodes normal. No abnormal inguinal nodes palpated Neurologic: Grossly normal   Pelvic: External genitalia:  no lesions              Urethra:  normal appearing urethra with no masses, tenderness or lesions              Bartholin's and Skene's: normal                 Vagina: normal appearing vagina with normal color and discharge, no lesions              Cervix: anteverted IUD string is visible              Pap taken: No. Bimanual Exam:  Uterus:  normal size, contour, position, consistency, mobility, non-tender              Adnexa: no mass, fullness, tenderness               Rectovaginal: Confirms               Anus:  normal sphincter tone, no lesions  Chaperone  present: yes  A:  Well Woman with normal exam  History of numerous UTE fibroids with Myomectomy and LOA 12/16/12 History of Menorrhagia with Mirena IUD 08/25/16 Endometriosis with dysmenorrhea             Remote history of  normal pap with + HR HPV 2006 & 2007 = negative # 16 & 18 Intentional wt loss with current BMI 34.95             Anemia with longer menses on IUD  S/P Hysteroscopy with Myosure and IUD removal and reinsertion of Mirena IUD 08/25/16  Continues with menorrhagia after last IUD.   P:   Reviewed health and wellness pertinent to exam  Pap smear not done  Refill Vit D and follow with labs  Counseled on breast self exam, adequate intake of calcium and vitamin D, diet and exercise return annually or prn  An After Visit Summary was printed and given to the patient. Note sent to Dr. Sabra Heck that pt is ready for surgery.

## 2016-11-24 ENCOUNTER — Ambulatory Visit (INDEPENDENT_AMBULATORY_CARE_PROVIDER_SITE_OTHER): Payer: BLUE CROSS/BLUE SHIELD | Admitting: Nurse Practitioner

## 2016-11-24 ENCOUNTER — Encounter: Payer: Self-pay | Admitting: Nurse Practitioner

## 2016-11-24 VITALS — BP 120/76 | HR 72 | Ht 61.75 in | Wt 189.0 lb

## 2016-11-24 DIAGNOSIS — Z01419 Encounter for gynecological examination (general) (routine) without abnormal findings: Secondary | ICD-10-CM

## 2016-11-24 DIAGNOSIS — Z0000 Encounter for general adult medical examination without abnormal findings: Secondary | ICD-10-CM

## 2016-11-24 DIAGNOSIS — D500 Iron deficiency anemia secondary to blood loss (chronic): Secondary | ICD-10-CM | POA: Diagnosis not present

## 2016-11-24 DIAGNOSIS — N939 Abnormal uterine and vaginal bleeding, unspecified: Secondary | ICD-10-CM

## 2016-11-24 DIAGNOSIS — D251 Intramural leiomyoma of uterus: Secondary | ICD-10-CM

## 2016-11-24 DIAGNOSIS — E559 Vitamin D deficiency, unspecified: Secondary | ICD-10-CM

## 2016-11-24 DIAGNOSIS — Z975 Presence of (intrauterine) contraceptive device: Secondary | ICD-10-CM

## 2016-11-24 LAB — CBC
HEMATOCRIT: 39.2 % (ref 35.0–45.0)
Hemoglobin: 12.3 g/dL (ref 11.7–15.5)
MCH: 28.1 pg (ref 27.0–33.0)
MCHC: 31.4 g/dL — AB (ref 32.0–36.0)
MCV: 89.7 fL (ref 80.0–100.0)
MPV: 10.2 fL (ref 7.5–12.5)
PLATELETS: 248 10*3/uL (ref 140–400)
RBC: 4.37 MIL/uL (ref 3.80–5.10)
RDW: 14.6 % (ref 11.0–15.0)
WBC: 3.8 10*3/uL (ref 3.8–10.8)

## 2016-11-24 LAB — COMPREHENSIVE METABOLIC PANEL
ALBUMIN: 4 g/dL (ref 3.6–5.1)
ALT: 9 U/L (ref 6–29)
AST: 14 U/L (ref 10–30)
Alkaline Phosphatase: 79 U/L (ref 33–115)
BILIRUBIN TOTAL: 0.9 mg/dL (ref 0.2–1.2)
BUN: 12 mg/dL (ref 7–25)
CHLORIDE: 106 mmol/L (ref 98–110)
CO2: 24 mmol/L (ref 20–31)
CREATININE: 0.63 mg/dL (ref 0.50–1.10)
Calcium: 8.8 mg/dL (ref 8.6–10.2)
GLUCOSE: 82 mg/dL (ref 65–99)
Potassium: 4.5 mmol/L (ref 3.5–5.3)
SODIUM: 139 mmol/L (ref 135–146)
Total Protein: 7 g/dL (ref 6.1–8.1)

## 2016-11-24 LAB — LIPID PANEL
Cholesterol: 151 mg/dL (ref <200)
HDL: 99 mg/dL (ref >50)
LDL Cholesterol: 44 mg/dL (ref <100)
TRIGLYCERIDES: 42 mg/dL (ref <150)
Total CHOL/HDL Ratio: 1.5 Ratio (ref <5.0)
VLDL: 8 mg/dL (ref <30)

## 2016-11-24 LAB — TSH: TSH: 1.19 m[IU]/L

## 2016-11-24 MED ORDER — VITAMIN D (ERGOCALCIFEROL) 1.25 MG (50000 UNIT) PO CAPS: 50000.0000 [IU] | ORAL_CAPSULE | ORAL | 3 refills | Status: DC

## 2016-11-24 NOTE — Patient Instructions (Signed)

## 2016-11-25 LAB — VITAMIN D 25 HYDROXY (VIT D DEFICIENCY, FRACTURES): VIT D 25 HYDROXY: 15 ng/mL — AB (ref 30–100)

## 2016-11-25 NOTE — Progress Notes (Signed)
Encounter reviewed by Dr. Brook Amundson C. Silva.  

## 2016-12-10 ENCOUNTER — Telehealth: Payer: Self-pay | Admitting: Obstetrics & Gynecology

## 2016-12-10 NOTE — Telephone Encounter (Signed)
Spoke with patient regarding benefit for surgery. Patient understood and agreeable. Patient states she would like to defer committing and scheduling surgery, until June 2018. Patient advises she will call back when she is ready to proceed with scheduling.   Routing to [employer]

## 2016-12-15 NOTE — Telephone Encounter (Signed)
Follow-up call to patient. Per ROI can leave message. Voice mail has number confirmation. Left message calling to confirm surgical plans. previously declined to schedule and wanted to wait till June. Left message to call back to review/confirm.

## 2016-12-16 NOTE — Telephone Encounter (Signed)
Return call to Sally. °

## 2016-12-16 NOTE — Telephone Encounter (Signed)
Return call to patient. She will call in June when more certain of her schedule and ready to proceed. Options for dates in August discussed. Also discussed out of work time of 4 weeks as opposed to patients initial thought of 2 weeks.  Given phone number for pre-services center to call for benefit information. Patient will call back when ready to schedule.  Routing to provider for final review. Patient agreeable to disposition. Will close encounter.

## 2017-01-01 ENCOUNTER — Other Ambulatory Visit: Payer: Self-pay | Admitting: Obstetrics & Gynecology

## 2017-01-01 DIAGNOSIS — Z1231 Encounter for screening mammogram for malignant neoplasm of breast: Secondary | ICD-10-CM

## 2017-01-07 ENCOUNTER — Other Ambulatory Visit: Payer: Self-pay | Admitting: Obstetrics & Gynecology

## 2017-01-08 ENCOUNTER — Encounter: Payer: Self-pay | Admitting: Obstetrics & Gynecology

## 2017-01-08 NOTE — Telephone Encounter (Signed)
Medication refill request: Ibuprofen 800mg  Last AEX:  11/24/16 PG Next AEX: 11/30/17 Last MMG (if hormonal medication request): scheduled for 03/17/17  Refill authorized: 10/09/16 #30 w/1 refill; today please advise

## 2017-01-09 ENCOUNTER — Other Ambulatory Visit: Payer: Self-pay | Admitting: Obstetrics & Gynecology

## 2017-01-09 MED ORDER — IBUPROFEN 800 MG PO TABS: 800.0000 mg | ORAL_TABLET | Freq: Three times a day (TID) | ORAL | 1 refills | Status: DC | PRN

## 2017-01-13 DIAGNOSIS — Z0000 Encounter for general adult medical examination without abnormal findings: Secondary | ICD-10-CM | POA: Diagnosis not present

## 2017-02-02 ENCOUNTER — Encounter: Payer: Self-pay | Admitting: Neurology

## 2017-02-02 ENCOUNTER — Ambulatory Visit (INDEPENDENT_AMBULATORY_CARE_PROVIDER_SITE_OTHER): Payer: BLUE CROSS/BLUE SHIELD | Admitting: Neurology

## 2017-02-02 VITALS — BP 121/70 | HR 83 | Ht 61.0 in | Wt 198.0 lb

## 2017-02-02 DIAGNOSIS — J3489 Other specified disorders of nose and nasal sinuses: Secondary | ICD-10-CM

## 2017-02-02 DIAGNOSIS — Q8500 Neurofibromatosis, unspecified: Secondary | ICD-10-CM

## 2017-02-02 NOTE — Patient Instructions (Addendum)
Remember to drink plenty of fluid, eat healthy meals and do not skip any meals. Try to eat protein with a every meal and eat a healthy snack such as fruit or nuts in between meals. Try to keep a regular sleep-wake schedule and try to exercise daily, particularly in the form of walking, 20-30 minutes a day, if you can.   As far as diagnostic testing: MRI brain, spine and pelvis; ENT referral; [place] specialty clinic referral; Ophthalmology referral; dermatology referral.   I would like to see you back in one year, sooner if we need to. Please call us with any interim questions, concerns, problems, updates or refill requests.   Our phone number is [phone]. We also have an after hours call service for urgent matters and there is a physician on-call for urgent questions. For any emergencies you know to call 911 or go to the nearest emergency room  Neurofibromatosis, Adult Neurofibromatosis is a rare genetic disorder that causes the development of nerve tumors and skin and bone changes. The tumors are usually not cancerous (benign) and differ, depending on the type of neurofibromatosis present. There are three types of neurofibromatosis:  Neurofibromatosis 1 (NF1). This is the most common type.  Neurofibromatosis 2 (NF2).  Schwannomatosis. This is the least common type. In most cases, neurofibromatosis slowly gets worse over time. What are the causes? Neurofibromatosis is caused by a change, or mutation, in genes that control the growth of nerve cells. You may have neurofibromatosis if:  You inherit mutated genes from your parents. Up to half of all cases of NF1 and NF2 are inherited.  Your genes mutate. It is not known why this happens. This is the most common cause of schwannomatosis. What are the signs or symptoms? Your symptoms will depend on the type of neurofibromatosis you have. Signs and symptoms of NF1 usually begin in childhood. They may include:  Coffee-colored skin spots  (cafe-au-lait spots).  Pea-sized lumps under the skin.  Freckles in the groin or under the arms.  Benign tumors (neurofibromas) attached to one or more nerves.  Discolored specks in the colored part of the eye (iris).  Curvature of the spine or other bone deformities.  Headaches.  Seizures.  Learning disabilities. Signs and symptoms of NF2 usually begin in early adulthood. They may include:  Benign tumors that develop on a nerve inside the ear (schwannoma).  Ringing in the ears.  Progressive or sudden hearing loss.  Balance problems or dizziness.  Headache.  Ear pressure or pain.  Facial pain on the affected side.  Vision changes.  Numbness or weakness of the face on the affected side. Signs and symptoms of schwannomatosis usually do not show up until adulthood. They may include:  Long-term pain. This is the most common symptom.  Development of numerous tumors throughout the body except in the ear.  Numbness.  Tingling. How is this diagnosed? Your health care provider can diagnose neurofibromatosis based on your symptoms and a physical exam. Your health care provider may also do tests to help make the diagnosis. These may include:  Blood tests.  Imaging studies such as an MRI or CT scan.  Hearing, vision, and balance tests.  Tests for learning disabilities. It may take many years to be diagnosed with the disease because signs and symptoms may develop slowly. How is this treated? There is no cure for neurofibromatosis. Treatment may include:  Medicines to control pain or seizures.  Therapy for disabilities.  Surgery to remove tumors that cause symptoms  or become disfiguring. There is a small chance that tumors in people with NF1 will become cancerous. Tumors that become cancerous may need to be treated with a combination of:  Surgical removal.  Radiation treatments.  Cancer drugs (chemotherapy). Follow these instructions at home: Take medicines  only as directed by your health care provider. Contact a health care provider if:  You develop any new symptoms.  Your symptoms get worse. Get help right away if:  You have a seizure.  You have a severe headache.  You have a sudden change in vision, balance, or hearing. This information is not intended to replace advice given to you by your health care provider. Make sure you discuss any questions you have with your health care provider. Document Released: 04/11/2014 Document Revised: 02/20/2016 Document Reviewed: 11/08/2013 Elsevier Interactive Patient Education  2017 [employer].

## 2017-02-02 NOTE — Progress Notes (Addendum)
[facility]    Provider:  Dr Jaynee Eagles Referring Provider: Alroy Dust, [name], MD Primary Care Physician:  Alroy Dust, Carlean Jews.Marlou Sa, MD  CC:  Neurofibromatosis.  Addendum 03/17/17: NF type 1. Good ou, full VF ou, essentially normal complete exam, f/u one year  HPI:  Lacey Lee is a 41 y.o. female here as a referral from Dr. Alroy Dust for neurofibromatosis. Past medical history of depression, generalized anxiety disorder, iron deficiency anemia and vitamin D deficiency. Patient's father with neurofibromatosis. Since last being seen 6 years ago by Dr. Erling Cruz who is a retired Garment/textile technologist. Saw a Paediatric nurse in 2013. Last imaging in 2012 and no follow up since then. She has some new skin lesions. She has noticed vision changes. She has chronic back pain more in the lower back and radiating into the hip. No GI issues. She gets regular obgyn screening. Discussed specialty clinics. No abdominal pain. No sensory paresthesias. No cognitive deficits. Never been to a specialty clinic. No weakness, no seizures, she has had some dizziness and headaches when she stands up worse better if she sits down. The headache is pressure in the face has been worsening. No cognitive problems, no falls, no weakness, no other pain. She has chronic constipation and anemia.   Reviewed notes, labs and imaging from outside physicians, which showed:  Reviewed MRI of the brain from 1993 report: FINDINGS:  [place].  THIS DEMONSTRATES MARKED CONTRAST ENHANCEMENT.  IN A PATIENT WITH NEUROFIBROMATOSIS 1, THIS COULD REPRESENT A SMALL PILOCYTIC ASTROCYTOMA AND IT HAS NOT CHANGED IN SIZE.  IN ADDITION, THERE IS NO SIGNIFICANT CHANGE IN A SMALL LESION IN THE SPLENIUM OF THE CORPUS CALLOSUM THAT DEMONSTRATES MINIMAL CONTRAST ENHANCEMENT.  THIS IS LIKELY BENIGN AND MAY REPRESENT A HAMARTOMA OR AN AREA OF GLIOSIS.  THERE ARE NO NEW ENHANCING LESIONS.   NO SIGNIFICANT MASS EFFECT AND NO MIDLINE SHIFT.  NEGATIVE FOR EXTRA-AXIAL FLUID COLLECTIONS. THE VENTRICULAR SYSTEM AND SULCI ARE OF NORMAL SIZE. CONCLUSIONS: 1.  NO SIGNIFICANT CHANGE IN SMALL ENHANCING NODULAR LESION PERIATRIAL REGION ON THE LEFT.  DIFFERENTIAL INCLUDES PILOCYTIC ASTROCYTOMA, HAMARTOMA.  2.  SMALL, MILDLY ENHANCING LESION IN THE SPLENIUM OF THE CORPUS CALLOSUM ALSO UNCHANGED AND LIKELY REPRESENTS BENIGN LESION SUCH AS HAMARTOMA. APPROVING MD:  Virgel Bouquet  MRI spine 1992: Reviewed report    COMPARE 08-03-89.  NO COMPARISON IS AVAILABLE FOR THE THORACIC AND CERVICAL SPINE.   Marland Kitchen   FINDINGS: NO SIGNIFICANT CHANGE IN THE CYSTIC STRUCTURE IN THE SPINAL CANAL AT THE LEVEL OF S-3.  THIS LESION FOLLOWS C.S.F. ON ALL PULSE SEQUENCES, AND DOES NOT ENHANCE.  THIS IS FELT TO BE MOST CONSISTENT WITH SACRAL CYSTS.  NEUROFIBROMA WOULD BE EXPECTED TO ENHANCE, AT LEAST IN THE PERIPHERY.   NEGATIVE FOR EVIDENCE OF OTHER SPINAL ABNORMALITY. NEURAL FORAMINA ARE NORMAL IN SIZE AND NO FORAMINAL MASSES ARE IDENTIFIED.  THERE IS NO EVIDENCE OF CORD OR NERVE ROOT COMPRESSION.  INCIDENTAL NOTE IS MADE OF BILATERL PROMINENT OVARIES WITH MULTIPLE CYSTS, CONSISTENT WITH FOLLICLES.  THIS IS NOT EVIDENT ON PRIOR EXAM, ALTHOUGH IMAGING DID NOT EXTEND FULLY INTO THE PELVIS ON PRIOR STUDY. CONCLUSIONS:   1.  NO SIGNIFICANT CHANGE IN  SACRAL CYST.   2.  BILATERAL PROMINENT OVARIES, WHICH ARE NOW SOMEWHAT CYSTIC IN APPEARANCE, CONSISTENT WITH MULTIPLE FOLLICLES.   3.  NEGATIVE FOR OTHER SPINAL LESION. APPROVING MD:  Iva Lento  MRI pelvis 07/1989  FINDINGS:  SEVERAL WELL-CIRCUMSCRIBED AREA OF  HYPOINTENSITY ON T1 WEIGHTED IMAGES AND INCREASED SIGNAL INTENSITY ON T2 WEIGHTED IMAGES ACCOMPANY THE NEUROVASCULAR BUNDLE INFERIORLY WITHIN THE TRUE PELVIS. THEY ARE SYMMETRIC IN CHARACTER.  WHILE THESE MOST LIKELY REPRESENT LYMPH NODES, SMALL SCHWANNOMAS OR NEUROFIBROMAS CANNOT BE  EXCLUDED.  NO LARGE PLEXIFORM LESIONS ARE SEEN WITHIN THE PELVIS.  AT APPROX. S-3 LEVEL, WITHIN THE CANAL, THERE IS AN ENLARGED, WELL-CIRCUMSCRIBED AREA, HYPOINTENSE ON T1 WEIGHTED IMAGES, AND HYPERINTENSE ON T2 WEIGHTED IMAGES FOLLOWING THE SIGNAL OF THE C.S.F.   HYPERINTENSITY IS SEEN WITHIN A SLIGHTLY ENLARGED ENDOMETRIAL CAVITY, PRESUMABLY REPRESENTING PROLIFERATIVE OR SLOUGHED ENDOMETRIUM.  BOTH OVARIES ARE SEEN, AS WELL, WITH THE LEFT OVARY DEMONSTRATING A HYPOINTENSE LESION ON ALL SEQUENCES MEDIALLY, LIKELY REPRESENTING A SMALL REGION OF CALCIFICATION OR FIBROSIS/SCARRING. CONCLUSIONS:   1.  PROBABLE SMALL LYMPH NODES ACCOMPANYING NEUROVASCULAR BUNDLE IN THE TRUE PELVIS.  SMALL NEUROFIBROMAS CANNOT BE TOTALLY EXCLUDED.   2.  NO LARGE PLEXIFORM OR OTHER MASSES WITHIN THE PELVIS.   3.  WELL-DEFINED REGION FOLLOWING C.S.F. IN INTENSITY ON SEQUENCES INVOLVING THE S-3 REGION WITHIN THE SACRAL SPINAL CANAL.  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 A CENTRAL MENINGOCELE OR ARACHNOID CYST CANNOT BE EXCLUDED. RECOMMEND CORRELATION WITH L-S SPINE MR, PERFORMED ON 08-03-89.  I reviewed previous neurology notes. She was first seen by Dr. love in 2003 for evaluation of neurofibromatosis. She has a positive family history in her father, paternal grandmother, 3 paternal uncles and a paternal aunt. Per notes, previous MRIs of the brain cervical spine thoracic and lumbar spine with and without contrast were normal last evaluation was January 2011 with previous imaging April 2003, September 2004, August 2005 and September 2 6. She was describing headaches at that time. No visual disturbances.  Review of Systems: Patient complains of symptoms per HPI as well as the following symptoms: Anemia, easy bruising, feeling cold, joint pain, allergies, dizziness, moles. Pertinent negatives per HPI. All others negative.   Social History   Social History  .  Marital status: Single    Spouse name: N/A  . Number of children: 0  . Years of education: N/A   Occupational History  . 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 Smoking status: Never Smoker  . Smokeless tobacco: Never Used  . Alcohol use 0.0 oz/week     Comment: OCCASIONAL  . Drug use: No  . Sexual activity: Yes    Partners: Male    Birth control/ protection: IUD     Comment: Mirena IUD placed  08-25-16   Other Topics Concern  . Not on file   Social History Narrative  . No narrative on file    Family History  Problem Relation Age of Onset  . Allergies Mother        food  . Hypertension Mother   . Hyperlipidemia Mother   . Heart failure Mother   . Thyroid disease Mother   . Diabetes Mother   . Lung cancer Father   . Diabetes Sister   . Hypertension Sister   . Uterine cancer Paternal Grandmother     Past Medical History:  Diagnosis Date  . Asthma    albuterol rescue inhaler-uses prn, exacerbated by pet dander  . History of ectopic pregnancy 1999   S/P RIGHT SALPINGECTOMY  . History of herpes simplex type 2 infection 2005  . History of uterine fibroid   . Iron deficiency anemia   . Menorrhagia   . Neurofibromatosis ([place])  dx is child--  nonmalignant  . Seasonal allergies   . Uterine fibroid     Past Surgical History:  Procedure Laterality Date  . DILATATION & CURETTAGE/HYSTEROSCOPY WITH MYOSURE N/A 08/07/2016   Procedure: HYSTEROSCOPY WITH MYOSURE;  Surgeon: Megan Salon, MD;  Location: [facility];  Service: Gynecology;  Laterality: N/A;  Fibroid resection  . IUD REMOVAL N/A 08/07/2016   Procedure: INTRAUTERINE DEVICE (IUD) REMOVAL;  Surgeon: Megan Salon, MD;  Location: [facility];  Service: Gynecology;  Laterality: N/A;  . LAPAROSCOPY FOR ECTOPIC PREGNANCY  2000   RIGHT SALPINGECTOMY  . ROBOT ASSISTED MYOMECTOMY N/A 12/16/2012   Procedure: ROBOTIC ASSISTED MYOMECTOMY;  Surgeon:  Governor Specking, MD;  Location: [facility];  Service: Gynecology;  Laterality: N/A;  . ROBOTIC ASSISTED LAPAROSCOPIC LYSIS OF ADHESION  12/16/2012   Procedure: ROBOTIC ASSISTED LAPAROSCOPIC LYSIS OF ADHESION;  Surgeon: Governor Specking, MD;  Location: [facility];  Service: Gynecology;;  with excision of perotoneal lesions  . WISDOM TOOTH EXTRACTION      Current Outpatient Prescriptions  Medication Sig Dispense Refill  . albuterol (PROVENTIL HFA;VENTOLIN HFA) 108 (90 BASE) MCG/ACT inhaler Inhale 2 puffs into the lungs every 6 (six) hours as needed for wheezing.     Marland Kitchen ALPRAZolam (XANAX) 0.25 MG tablet Take 0.25 mg by mouth 3 (three) times daily as needed.     Marland Kitchen buPROPion (WELLBUTRIN SR) 200 MG 12 hr tablet Take 200 mg by mouth 2 (two) times daily.     . busPIRone (BUSPAR) 5 MG tablet Take 5 mg by mouth every morning.     . Calcium Carb-Cholecalciferol (CALCIUM 600-D PO) Take 1,200 mg by mouth.    . cetirizine (ZYRTEC) 10 MG tablet TK 1 T PO QD PRN  0  . EPIPEN 2-PAK 0.3 MG/0.3ML DEVI as needed (environmental allergies).     . Ferrous Fumarate (HEMOCYTE - 106 MG FE) 324 (106 Fe) MG TABS tablet Take 1 tablet by mouth daily.    Marland Kitchen ibuprofen (ADVIL,MOTRIN) 800 MG tablet Take 1 tablet (800 mg total) by mouth every 8 (eight) hours as needed. 30 tablet 1  . MIRENA, 52 MG, 20 MCG/24HR IUD     . Naproxen Sodium (ALEVE PO) Take by mouth as needed.     . Omega-3 Fatty Acids (FISH OIL) 1200 MG CAPS Take 1 capsule by mouth daily.     . Vitamin D, Ergocalciferol, (DRISDOL) 50000 units CAPS capsule Take 1 capsule (50,000 Units total) by mouth every 7 (seven) days. 30 capsule 3   No current facility-administered medications for this visit.     Allergies as of 02/02/2017 - Review Complete 02/02/2017  Allergen Reaction Noted  . Latex Other (See Comments) 01/10/2013  . Other Hives and Swelling 11/13/2014  . Tramadol Nausea And Vomiting 02/18/2016    Vitals: BP 121/70   Pulse 83   Ht 5\' 1"  (1.549 m)   Wt 198 lb  (89.8 kg)   BMI 37.41 kg/m  Last Weight:  Wt Readings from Last 1 Encounters:  02/02/17 198 lb (89.8 kg)   Last Height:   Ht Readings from Last 1 Encounters:  02/02/17 5\' 1"  (1.549 m)   Physical exam: Exam: Gen: NAD, conversant, well nourised, obese, well groomed                     CV: RRR, no MRG. No Carotid Bruits. No peripheral edema, warm, nontender Eyes: Conjunctivae clear without exudates or hemorrhage Skin: cafe au  lait spots and freckling.   Neuro: Detailed Neurologic Exam  Speech:    Speech is normal; fluent and spontaneous with normal comprehension.  Cognition:    The patient is oriented to person, place, and time;     recent and remote memory intact;     language fluent;     normal attention, concentration,     fund of knowledge Cranial Nerves:    The pupils are equal, round, and reactive to light. The fundi are normal and spontaneous venous pulsations are present. Visual fields are full to finger confrontation. Extraocular movements are intact. Trigeminal sensation is intact and the muscles of mastication are normal. The face is symmetric. The palate elevates in the midline. Hearing intact. Voice is normal. Shoulder shrug is normal. The tongue has normal motion without fasciculations.   Coordination:    Normal finger to nose and heel to shin. Normal rapid alternating movements.   Gait:    Heel-toe and tandem gait are normal.   Motor Observation:    No asymmetry, no atrophy, and no involuntary movements noted. Tone:    Normal muscle tone.    Posture:    Posture is normal. normal erect    Strength:    Strength is V/V in the upper and lower limbs.      Sensation: intact to LT     Reflex Exam:  DTR's: decreased left patellar reflex otherwise deep tendon reflexes in the upper and lower extremities are normal bilaterally.   Toes:    The toes are downgoing bilaterally.   Clonus:    Clonus is absent.       Assessment/Plan:  41 year old with NF1 lost  to follow up. Previous MRIs have reportedly been unremarkable. Has not had MRIs completed since 2014.  A formal ophthalmologic examination, including visual screening MRI brain, cervical spine, thoracic spine, lumbar spine screening for malignant peripheral nerve sheath tumors and malignant brain lesions ENT referral chronic sinus pressure and hearing evaluation in patient with NF1 [facility] specialty neurofibromatosis multi-disciplinary clinic followup with me in one year Dermatology  Orders Placed This Encounter  Procedures  . MR BRAIN W WO CONTRAST  . MR CERVICAL SPINE W WO CONTRAST  . MR THORACIC SPINE W WO CONTRAST  . MR Lumbar Spine W Wo Contrast  . Basic Metabolic Panel  . Ambulatory referral to Ophthalmology  . Ambulatory referral to ENT  . Ambulatory referral to Dermatology  . Ambulatory referral to Neurology     Sarina Ill, MD  [facility] [address] [place], [place] [address]  Phone [phone] Fax [phone]

## 2017-02-03 ENCOUNTER — Telehealth: Payer: Self-pay | Admitting: *Deleted

## 2017-02-03 LAB — BASIC METABOLIC PANEL
BUN/Creatinine Ratio: 15 (ref 9–23)
BUN: 10 mg/dL (ref 6–20)
CALCIUM: 8.9 mg/dL (ref 8.7–10.2)
CHLORIDE: 106 mmol/L (ref 96–106)
CO2: 23 mmol/L (ref 18–29)
Creatinine, Ser: 0.66 mg/dL (ref 0.57–1.00)
GFR calc Af Amer: 129 mL/min/{1.73_m2} (ref >59)
GFR calc non Af Amer: 112 mL/min/{1.73_m2} (ref >59)
Glucose: 84 mg/dL (ref 65–99)
POTASSIUM: 5.2 mmol/L (ref 3.5–5.2)
Sodium: 141 mmol/L (ref 134–144)

## 2017-02-03 NOTE — Telephone Encounter (Signed)
Called and spoke with patient about normal labs per AA,MD note. She verbalized understanding.  

## 2017-02-03 NOTE — Telephone Encounter (Signed)
-----   Message from Melvenia Beam, MD sent at 02/03/2017  2:41 PM EDT ----- Labs normal

## 2017-02-04 DIAGNOSIS — Q8500 Neurofibromatosis, unspecified: Secondary | ICD-10-CM | POA: Insufficient documentation

## 2017-02-18 DIAGNOSIS — J3081 Allergic rhinitis due to animal (cat) (dog) hair and dander: Secondary | ICD-10-CM | POA: Diagnosis not present

## 2017-02-18 DIAGNOSIS — J301 Allergic rhinitis due to pollen: Secondary | ICD-10-CM | POA: Diagnosis not present

## 2017-02-18 DIAGNOSIS — J343 Hypertrophy of nasal turbinates: Secondary | ICD-10-CM | POA: Diagnosis not present

## 2017-02-18 DIAGNOSIS — J3089 Other allergic rhinitis: Secondary | ICD-10-CM | POA: Diagnosis not present

## 2017-02-27 ENCOUNTER — Ambulatory Visit
Admission: RE | Admit: 2017-02-27 | Discharge: 2017-02-27 | Disposition: A | Discharge status: Home / Self Care | Payer: BLUE CROSS/BLUE SHIELD | Source: Ambulatory Visit | Attending: Neurology | Admitting: Neurology

## 2017-02-27 DIAGNOSIS — Q8500 Neurofibromatosis, unspecified: Secondary | ICD-10-CM | POA: Diagnosis not present

## 2017-02-27 DIAGNOSIS — M4802 Spinal stenosis, cervical region: Secondary | ICD-10-CM | POA: Diagnosis not present

## 2017-02-27 DIAGNOSIS — D3610 Benign neoplasm of peripheral nerves and autonomic nervous system, unspecified: Secondary | ICD-10-CM | POA: Diagnosis not present

## 2017-02-27 MED ORDER — GADOBENATE DIMEGLUMINE 529 MG/ML IV SOLN
20.0000 mL | Freq: Once | INTRAVENOUS | Status: AC | PRN
  Administered 2017-02-27: 19 mL via INTRAVENOUS

## 2017-03-02 ENCOUNTER — Telehealth: Payer: Self-pay | Admitting: Obstetrics & Gynecology

## 2017-03-02 NOTE — Telephone Encounter (Signed)
Patient called to advise she is ready to proceed with scheduling recommended surgery.  I advised patient, I will reverify benefits and advise nurse supervisor patient is ready to review scheduling dates.  Routing to [employer]

## 2017-03-02 NOTE — Telephone Encounter (Signed)
Return call to patient. Desires to proceed with surgery on 05-11-17. Advised will schedule and call back once confirmed and precert completed.

## 2017-03-04 ENCOUNTER — Other Ambulatory Visit: Payer: BLUE CROSS/BLUE SHIELD

## 2017-03-04 DIAGNOSIS — L7211 Pilar cyst: Secondary | ICD-10-CM | POA: Diagnosis not present

## 2017-03-04 DIAGNOSIS — D3610 Benign neoplasm of peripheral nerves and autonomic nervous system, unspecified: Secondary | ICD-10-CM | POA: Diagnosis not present

## 2017-03-04 DIAGNOSIS — D225 Melanocytic nevi of trunk: Secondary | ICD-10-CM | POA: Diagnosis not present

## 2017-03-05 ENCOUNTER — Telehealth: Payer: Self-pay

## 2017-03-05 NOTE — Telephone Encounter (Signed)
Received faxed consult notes from 02/18/17 visit for sinus pressure. Dr. Berle Mull assessment and plan: "She apparently is exposed to formaldehyde and chemicals in her work, and I am going to treat her with Z-pack 500 mg per day #3, probiotic 10 1 tab per day #20, Allegra-D 12 1 tablet qpm. She is going to get an MRI with Dr. Jaynee Eagles. I would like to get a copy of that if we could. We will not get any further x-rays." Sent to med records for scanning, copy to Dr. Jaynee Eagles for review.

## 2017-03-08 ENCOUNTER — Other Ambulatory Visit: Payer: Self-pay | Admitting: Obstetrics & Gynecology

## 2017-03-09 ENCOUNTER — Telehealth: Payer: Self-pay | Admitting: *Deleted

## 2017-03-09 NOTE — Telephone Encounter (Signed)
-----   Message from Melvenia Beam, MD sent at 02/28/2017  6:20 PM EDT ----- MRI brain unchanged from 2008. MRi c-spine shows some increased degenerative/arthritic changes but nothing significant and no schwannomas.

## 2017-03-09 NOTE — Telephone Encounter (Signed)
Called patient. She requested a call back on work number [phone]. I called this number. Relayed results per AA,MD note. She verbalized understanding.

## 2017-03-16 ENCOUNTER — Telehealth: Payer: Self-pay

## 2017-03-16 ENCOUNTER — Ambulatory Visit
Admission: RE | Admit: 2017-03-16 | Discharge: 2017-03-16 | Disposition: A | Discharge status: Home / Self Care | Payer: BLUE CROSS/BLUE SHIELD | Source: Ambulatory Visit | Attending: Neurology | Admitting: Neurology

## 2017-03-16 DIAGNOSIS — M47816 Spondylosis without myelopathy or radiculopathy, lumbar region: Secondary | ICD-10-CM | POA: Diagnosis not present

## 2017-03-16 DIAGNOSIS — Q8500 Neurofibromatosis, unspecified: Secondary | ICD-10-CM | POA: Diagnosis not present

## 2017-03-16 DIAGNOSIS — M4184 Other forms of scoliosis, thoracic region: Secondary | ICD-10-CM | POA: Diagnosis not present

## 2017-03-16 MED ORDER — GADOBENATE DIMEGLUMINE 529 MG/ML IV SOLN
19.0000 mL | Freq: Once | INTRAVENOUS | Status: AC | PRN
  Administered 2017-03-16: 19 mL via INTRAVENOUS

## 2017-03-16 NOTE — Telephone Encounter (Signed)
Received  03/04/17 dermatology consult notes from Lyndle Herrlich w/ following findings: upper body benign appearing nevi; lilac colored, soft, compressible papule (neurofibroma); left parietal scalp mobile, firm nodule (pilar cyst). Plan to observe, no treatment necessary today per Dr. Allyson Sabal. Sent to med records for scanning, copy to Dr. Jaynee Eagles for review.

## 2017-03-17 ENCOUNTER — Ambulatory Visit
Admission: RE | Admit: 2017-03-17 | Discharge: 2017-03-17 | Disposition: A | Discharge status: Home / Self Care | Payer: BLUE CROSS/BLUE SHIELD | Source: Ambulatory Visit | Attending: Obstetrics & Gynecology | Admitting: Obstetrics & Gynecology

## 2017-03-17 DIAGNOSIS — Z1231 Encounter for screening mammogram for malignant neoplasm of breast: Secondary | ICD-10-CM | POA: Diagnosis not present

## 2017-03-17 DIAGNOSIS — Q8501 Neurofibromatosis, type 1: Secondary | ICD-10-CM | POA: Diagnosis not present

## 2017-03-18 ENCOUNTER — Telehealth: Payer: Self-pay | Admitting: *Deleted

## 2017-03-18 ENCOUNTER — Other Ambulatory Visit: Payer: Self-pay | Admitting: Obstetrics & Gynecology

## 2017-03-18 DIAGNOSIS — R928 Other abnormal and inconclusive findings on diagnostic imaging of breast: Secondary | ICD-10-CM

## 2017-03-18 DIAGNOSIS — Z0289 Encounter for other administrative examinations: Secondary | ICD-10-CM

## 2017-03-18 NOTE — Telephone Encounter (Signed)
Left patient a detailed message, with results, on her voicemail (ok per DPR).  Provided our number to call back with any questions.  

## 2017-03-18 NOTE — Telephone Encounter (Signed)
-----   Message from Melvenia Beam, MD sent at 03/18/2017  7:33 AM EDT ----- No schwannomas. A few small enhancing subcutaneous nodules consistent with cutaneous neurofibromas. Otherwise looks pretty unremarkable which is good news. thanks

## 2017-03-18 NOTE — Telephone Encounter (Signed)
Patient returned call. I confirmed with her that Gay Filler held 05/11/17 for her surgery date and she will be contacting her early next week with more details. Patient appreciative.

## 2017-03-18 NOTE — Telephone Encounter (Signed)
Called patient and left a message to call back and ask for Starla.   Details to relay per Gay Filler: Surgery date is 05/11/17 at 7:30 AM.

## 2017-03-22 ENCOUNTER — Ambulatory Visit
Admission: RE | Admit: 2017-03-22 | Discharge: 2017-03-22 | Disposition: A | Discharge status: Home / Self Care | Payer: BLUE CROSS/BLUE SHIELD | Source: Ambulatory Visit | Attending: Obstetrics & Gynecology | Admitting: Obstetrics & Gynecology

## 2017-03-22 DIAGNOSIS — N6489 Other specified disorders of breast: Secondary | ICD-10-CM | POA: Diagnosis not present

## 2017-03-22 DIAGNOSIS — R928 Other abnormal and inconclusive findings on diagnostic imaging of breast: Secondary | ICD-10-CM | POA: Diagnosis not present

## 2017-03-25 ENCOUNTER — Telehealth: Payer: Self-pay

## 2017-03-25 NOTE — Telephone Encounter (Signed)
Received consult notes from [facility] w/ essentially normal complete exam, will continue to check yearly; pt refracted, sees ok w/o glasses. Sent to med records for scanning, copy to Dr. Jaynee Eagles for review.

## 2017-04-06 ENCOUNTER — Telehealth: Payer: Self-pay | Admitting: *Deleted

## 2017-04-06 ENCOUNTER — Telehealth: Payer: Self-pay | Admitting: Obstetrics & Gynecology

## 2017-04-06 NOTE — Telephone Encounter (Signed)
Call to patient. Surgery information form reviewed with patient and she verbalized understanding. Aware a copy will be mailed to her. Pre op appointment and post op appointments scheduled for patient. Agreeable to date and time of all appointments. Aware to return call to office with any questions.   Routing to provider for final review. Patient agreeable to disposition. Will close encounter.    cc Lamont Snowball

## 2017-04-06 NOTE — Telephone Encounter (Signed)
Routing to provider for final review. Patient agreeable to disposition. Will close encounter.     

## 2017-04-06 NOTE — Telephone Encounter (Signed)
Patient returned call placed by Lamont Snowball, RN. Gay Filler was unavailable upon return call, I did however, advise patient of the reason for the call. An ultrasound has been added to her scheduled pre-operative appointment on 04/29/17.  Patient advised to arrive for the appointment at 10:15 AM.  Patient states she may have a conflict with an earlier appointment time, but states she will call back if she needs to make changes to the appointment.   cc: Lamont Snowball, RN

## 2017-04-06 NOTE — Telephone Encounter (Signed)
See next phone encounter for surgical instructions.   Routing to provider for final review. Patient agreeable to disposition. Will close encounter.

## 2017-04-07 ENCOUNTER — Telehealth: Payer: Self-pay | Admitting: Obstetrics & Gynecology

## 2017-04-07 NOTE — Telephone Encounter (Signed)
Patient called but did not have time to leave any details other than she'd like Lacey Lee to call her back. She has an upcoming surgery.

## 2017-04-08 NOTE — Telephone Encounter (Signed)
Return call to patient. Left message to call back. 

## 2017-04-09 NOTE — Telephone Encounter (Signed)
Call from patient. Checking on FMLA forms turned in on 03-18-17. Advised will check on this and call back.

## 2017-04-09 NOTE — Telephone Encounter (Signed)
Call back to patient. Advised will need to follow-up with her on Monday after checking with referral coordinator who is out of office today. Patient is agreeable.

## 2017-04-13 NOTE — Telephone Encounter (Signed)
Placed call to patient to advise FMLA forms have been completed. Upon return call will advise patient FMLA have been completed, will also confirm  where forms will need to been submitted, for surgery scheduled for 05/11/17  Cc: Lamont Snowball

## 2017-04-14 NOTE — Telephone Encounter (Signed)
FMLA and Short Term Disability forms have been faxed to patients employer, [employer] at fax number [phone], at patients requests. Forms were faxed 04/13/17 and confirmation received.  Spoke with patients employer, [employer] at phone number [phone]. She has confirmed forms have been received.

## 2017-04-20 ENCOUNTER — Telehealth: Payer: Self-pay | Admitting: Obstetrics & Gynecology

## 2017-04-20 NOTE — Telephone Encounter (Signed)
Left patient a message to call back to reschedule a future appointment that was cancelled by the provider for [facility]EX. °

## 2017-04-28 ENCOUNTER — Other Ambulatory Visit: Payer: Self-pay | Admitting: *Deleted

## 2017-04-28 DIAGNOSIS — D251 Intramural leiomyoma of uterus: Secondary | ICD-10-CM

## 2017-04-28 DIAGNOSIS — N939 Abnormal uterine and vaginal bleeding, unspecified: Secondary | ICD-10-CM

## 2017-04-29 ENCOUNTER — Encounter: Payer: Self-pay | Admitting: Obstetrics & Gynecology

## 2017-04-29 ENCOUNTER — Ambulatory Visit (INDEPENDENT_AMBULATORY_CARE_PROVIDER_SITE_OTHER): Payer: BLUE CROSS/BLUE SHIELD

## 2017-04-29 ENCOUNTER — Encounter (HOSPITAL_COMMUNITY)
Admission: RE | Admit: 2017-04-29 | Discharge: 2017-04-29 | Disposition: A | Discharge status: Home / Self Care | Payer: BLUE CROSS/BLUE SHIELD | Source: Ambulatory Visit | Attending: Obstetrics & Gynecology | Admitting: Obstetrics & Gynecology

## 2017-04-29 ENCOUNTER — Ambulatory Visit (INDEPENDENT_AMBULATORY_CARE_PROVIDER_SITE_OTHER): Payer: BLUE CROSS/BLUE SHIELD | Admitting: Obstetrics & Gynecology

## 2017-04-29 ENCOUNTER — Encounter (HOSPITAL_COMMUNITY): Payer: Self-pay

## 2017-04-29 VITALS — BP 116/72 | HR 82 | Resp 14 | Ht 61.0 in | Wt 202.0 lb

## 2017-04-29 DIAGNOSIS — D500 Iron deficiency anemia secondary to blood loss (chronic): Secondary | ICD-10-CM

## 2017-04-29 DIAGNOSIS — Z975 Presence of (intrauterine) contraceptive device: Secondary | ICD-10-CM | POA: Diagnosis not present

## 2017-04-29 DIAGNOSIS — D251 Intramural leiomyoma of uterus: Secondary | ICD-10-CM

## 2017-04-29 DIAGNOSIS — Z01818 Encounter for other preprocedural examination: Secondary | ICD-10-CM | POA: Insufficient documentation

## 2017-04-29 DIAGNOSIS — N920 Excessive and frequent menstruation with regular cycle: Secondary | ICD-10-CM | POA: Diagnosis not present

## 2017-04-29 DIAGNOSIS — N939 Abnormal uterine and vaginal bleeding, unspecified: Secondary | ICD-10-CM

## 2017-04-29 HISTORY — DX: Pneumonia, unspecified organism: J18.9

## 2017-04-29 HISTORY — DX: Anxiety disorder, unspecified: F41.9

## 2017-04-29 LAB — BASIC METABOLIC PANEL
ANION GAP: 8 (ref 5–15)
BUN: 11 mg/dL (ref 6–20)
CO2: 24 mmol/L (ref 22–32)
Calcium: 8.9 mg/dL (ref 8.9–10.3)
Chloride: 104 mmol/L (ref 101–111)
Creatinine, Ser: 0.66 mg/dL (ref 0.44–1.00)
GFR calc non Af Amer: >60 mL/min (ref >60)
Glucose, Bld: 77 mg/dL (ref 65–99)
Potassium: 3.6 mmol/L (ref 3.5–5.1)
Sodium: 136 mmol/L (ref 135–145)

## 2017-04-29 LAB — TYPE AND SCREEN
ABO/RH(D): O POS
ANTIBODY SCREEN: NEGATIVE

## 2017-04-29 LAB — CBC
HCT: 36.5 % (ref 36.0–46.0)
HEMOGLOBIN: 11.9 g/dL — AB (ref 12.0–15.0)
MCH: 27.9 pg (ref 26.0–34.0)
MCHC: 32.6 g/dL (ref 30.0–36.0)
MCV: 85.7 fL (ref 78.0–100.0)
Platelets: 208 10*3/uL (ref 150–400)
RBC: 4.26 MIL/uL (ref 3.87–5.11)
RDW: 14.3 % (ref 11.5–15.5)
WBC: 5.6 10*3/uL (ref 4.0–10.5)

## 2017-04-29 NOTE — Progress Notes (Signed)
GYNECOLOGY  VISIT   HPI: 41 y.o. [id number] Single African American female here for discussion of her desires to proceed with definitive treatment for menorrhagia, anemia and uterine fibroids.  These have been long-term issues for her.  She was originally treated 12/16/12 by Dr. Kerin Perna with robotic assisted myomectomy and right salpingectomy with lysis of adhesions due to uterine fibroids.  She also had a Mirena IUD placed that year.  This did help slow her flow some but she began to have a lot of spotting as well with the IUD.  Over the next few years, bleeding slowly worsened.  Ultrasounds showed fibroids to increase in number and size.  She did go to [facility] for consultation as well but ultimately felt that she was given no new options and returned for care with this office.  In 8/17 due to worsening bleeding, ultrasound was repeated showing two fibroids with submucosal components.  She underwent IUD removal and hysteroscopic resection of fibroids 08/07/16.  Mirena IUD was replaced.  Bleeding is better but still heavy and she has decided that she is completely tired of all of this.  She has gotten to a place emotionally where she is fine with not having childbearing.  This has taken some time but she is there and very comfortable with her decision.  She does have a significant other who is supportive.    Procedure discussed with patient.  Hospital stay, recovery and pain management all discussed.  Risks discussed including but not limited to bleeding, 1% risk of receiving a  transfusion, infection, 3-4% risk of bowel/bladder/ureteral/vascular injury discussed as well as possible need for additional surgery if injury does occur discussed.  DVT/PE and rare risk of death discussed.  My actual complications with prior surgeries discussed.  Vaginal cuff dehiscence discussed.  Hernia formation discussed.  Positioning and incision locations discussed.  Patient aware if pathology abnormal she may need additional  treatment.  All questions answered.    Has hx of neurofibromatosis, cutaneous.  Has neurology visit this year with Dr. Jaynee Eagles and had MRI of breast and entire spine.  Skin findings was only significant finding on MRI.  GYNECOLOGIC HISTORY: Patient's last menstrual period was 04/13/2017. Contraception: Mirena IUD Menopausal hormone therapy: n/a Last pap:  11/19/15 neg with neg HR HPV Endometrial pathology:  08/07/16--fibroid tissue  Patient Active Problem List   Diagnosis Date Noted  . Neurofibromatosis ([place]) 02/04/2017  . Greater trochanteric bursitis of left hip 08/31/2016  . Anemia due to chronic blood loss 08/27/2016  . Fibroids 11/22/2013  . Endometriosis 01/06/2013  . Seasonal and perennial allergic rhinitis 05/29/2012  . Asthma, mild intermittent 05/29/2012    Past Medical History:  Diagnosis Date  . Asthma    albuterol rescue inhaler-uses prn, exacerbated by pet dander  . History of ectopic pregnancy 1999   S/P RIGHT SALPINGECTOMY  . History of herpes simplex type 2 infection 2005  . History of uterine fibroid   . Iron deficiency anemia   . Menorrhagia   . Neurofibromatosis [facility])    dx is child--  nonmalignant  . Seasonal allergies   . Uterine fibroid     Past Surgical History:  Procedure Laterality Date  . DILATATION & CURETTAGE/HYSTEROSCOPY WITH MYOSURE N/A 08/07/2016   Procedure: HYSTEROSCOPY WITH MYOSURE;  Surgeon: Megan Salon, MD;  Location: [facility];  Service: Gynecology;  Laterality: N/A;  Fibroid resection  . IUD REMOVAL N/A 08/07/2016   Procedure: INTRAUTERINE DEVICE (IUD) REMOVAL;  Surgeon: Megan Salon,  MD;  Location: [facility];  Service: Gynecology;  Laterality: N/A;  . LAPAROSCOPY FOR ECTOPIC PREGNANCY  2000   RIGHT SALPINGECTOMY  . ROBOT ASSISTED MYOMECTOMY N/A 12/16/2012   Procedure: ROBOTIC ASSISTED MYOMECTOMY;  Surgeon: Governor Specking, MD;  Location: [facility];  Service: Gynecology;  Laterality: N/A;  . ROBOTIC  ASSISTED LAPAROSCOPIC LYSIS OF ADHESION  12/16/2012   Procedure: ROBOTIC ASSISTED LAPAROSCOPIC LYSIS OF ADHESION;  Surgeon: Governor Specking, MD;  Location: [facility];  Service: Gynecology;;  with excision of perotoneal lesions  . WISDOM TOOTH EXTRACTION      MEDS:   Current Outpatient Prescriptions on File Prior to Visit  Medication Sig Dispense Refill  . albuterol (PROVENTIL HFA;VENTOLIN HFA) 108 (90 BASE) MCG/ACT inhaler Inhale 2 puffs into the lungs every 6 (six) hours as needed for wheezing or shortness of breath.     . ALPRAZolam (XANAX) 0.25 MG tablet Take 0.25 mg by mouth daily as needed for anxiety.     . Calcium Carb-Cholecalciferol (CALCIUM 600-D PO) Take 2 tablets by mouth daily.     . cetirizine (ZYRTEC) 10 MG tablet Take 10 mg by mouth daily.    Marland Kitchen EPIPEN 2-PAK 0.3 MG/0.3ML DEVI 0.3 mg as needed (environmental allergies).     . Ferrous Fumarate (HEMOCYTE - 106 MG FE) 324 (106 Fe) MG TABS tablet Take 1 tablet by mouth daily.    Marland Kitchen ibuprofen (ADVIL,MOTRIN) 800 MG tablet Take 1 tablet (800 mg total) by mouth every 8 (eight) hours as needed. (Patient taking differently: Take 800 mg by mouth every 8 (eight) hours as needed for moderate pain. ) 30 tablet 1  . MIRENA, 52 MG, 20 MCG/24HR IUD 1 each by Intrauterine route once.     . Omega-3 Fatty Acids (FISH OIL) 1200 MG CAPS Take 1 capsule by mouth 4 (four) times a week.     . Vitamin D, Ergocalciferol, (DRISDOL) 50000 units CAPS capsule Take 1 capsule (50,000 Units total) by mouth every 7 (seven) days. 30 capsule 3   No current facility-administered medications on file prior to visit.      ALLERGIES: Latex; Other; and Tramadol  Family History  Problem Relation Age of Onset  . Allergies Mother        food  . Hypertension Mother   . Hyperlipidemia Mother   . Heart failure Mother   . Thyroid disease Mother   . Diabetes Mother   . Lung cancer Father   . Diabetes Sister   . Hypertension Sister   . Uterine cancer Paternal Grandmother    . Breast cancer Neg Hx     SH:  Single, in stable relationship, non smoker  Review of Systems  All other systems reviewed and are negative.   PHYSICAL EXAMINATION:    BP 116/72 (BP Location: Right Arm, Patient Position: Sitting, Cuff Size: Large)   Pulse 82   Resp 14   Ht 5\' 1"  (1.549 m)   Wt 202 lb (91.6 kg)   LMP 04/13/2017   BMI 38.17 kg/m     General appearance: alert, cooperative and appears stated age Neck: no adenopathy, supple, symmetrical, trachea midline and thyroid normal to inspection and palpation CV:  Regular rate and rhythm Lungs:  clear to auscultation, no wheezes, rales or rhonchi, symmetric air entry Abdomen: soft, non-tender; bowel sounds normal; no masses,  no organomegaly  Pelvic: External genitalia:  no lesions              Urethra:  normal appearing urethra  with no masses, tenderness or lesions              Bartholins and Skenes: normal                 Vagina: normal appearing vagina with normal color and discharge, no lesions              Cervix: no lesions              Bimanual Exam:  Uterus:  enlarged, 10 weeks, mobile weeks size              Adnexa: no mass, fullness, tenderness              Anus: no lesions  Chaperone was present for exam.  Ultrasound was also performed today. Uterus:  7 x 5.4 x 6.0cm with intramural fibroids measuring 2.5cm, 2.6cm, 1.3cm  Endometrium:  5.64mm Left ovary:  2.4 x 1.3 x 1.2cm Right ovary:  2.9 x 2.6 x 2.7cm with 2.1cm corpus luteal cyst Cul de sac:  Free fluid noted around right adnexa  Assessment: Long hx of symptomatic uterine fibroids with menorrhagia and anemia, desirous of definitive treatment Cutaneous neurofibromatosis Asthma  Plan: Procedure reviewed, risks and benefits reviewed, hospital stay reviewed, questions answered. Post operative instructions reviewed.  Post op pain management reviewed.  Medications will be given at discharge.   ~40 minutes spent with patient >50% of time was in face to  face discussion of above.

## 2017-04-29 NOTE — Progress Notes (Deleted)
41 y.o. [id number] SingleAfrican American female here for discussion of upcoming procedure. HYSTERECTOMY TOTAL LAPAROSCOPIC WITH SALPINGECTOMY, CYSTOSCOPY  Possible, INTRAUTERINE DEVICE (IUD) REMOVAL possible      planned due to ***.  Pre-op evaluation thus far has included ***.     Ob Hx:   Patient's last menstrual period was 04/13/2017.          Sexually active: Yes.   Birth control: IUD Last pap: 11/19/15 negative, HR HPV negative, 11/13/14 negative, HR HPV negative  Last MMG: 03/22/17 BIRADS 1 negative  Tobacco: Never smoker   Past Surgical History:  Procedure Laterality Date  . DILATATION & CURETTAGE/HYSTEROSCOPY WITH MYOSURE N/A 08/07/2016   Procedure: HYSTEROSCOPY WITH MYOSURE;  Surgeon: Megan Salon, MD;  Location: [facility];  Service: Gynecology;  Laterality: N/A;  Fibroid resection  . IUD REMOVAL N/A 08/07/2016   Procedure: INTRAUTERINE DEVICE (IUD) REMOVAL;  Surgeon: Megan Salon, MD;  Location: [facility];  Service: Gynecology;  Laterality: N/A;  . LAPAROSCOPY FOR ECTOPIC PREGNANCY  2000   RIGHT SALPINGECTOMY  . ROBOT ASSISTED MYOMECTOMY N/A 12/16/2012   Procedure: ROBOTIC ASSISTED MYOMECTOMY;  Surgeon: Governor Specking, MD;  Location: [facility];  Service: Gynecology;  Laterality: N/A;  . ROBOTIC ASSISTED LAPAROSCOPIC LYSIS OF ADHESION  12/16/2012   Procedure: ROBOTIC ASSISTED LAPAROSCOPIC LYSIS OF ADHESION;  Surgeon: Governor Specking, MD;  Location: [facility];  Service: Gynecology;;  with excision of perotoneal lesions  . WISDOM TOOTH EXTRACTION      Past Medical History:  Diagnosis Date  . Asthma    albuterol rescue inhaler-uses prn, exacerbated by pet dander  . History of ectopic pregnancy 1999   S/P RIGHT SALPINGECTOMY  . History of herpes simplex type 2 infection 2005  . History of uterine fibroid   . Iron deficiency anemia   . Menorrhagia   . Neurofibromatosis [facility])    dx is child--  nonmalignant  . Seasonal allergies   . Uterine  fibroid     Allergies: Latex; Other; and Tramadol  Current Outpatient Prescriptions  Medication Sig Dispense Refill  . albuterol (PROVENTIL HFA;VENTOLIN HFA) 108 (90 BASE) MCG/ACT inhaler Inhale 2 puffs into the lungs every 6 (six) hours as needed for wheezing or shortness of breath.     . ALPRAZolam (XANAX) 0.25 MG tablet Take 0.25 mg by mouth daily as needed for anxiety.     . Calcium Carb-Cholecalciferol (CALCIUM 600-D PO) Take 2 tablets by mouth daily.     . cetirizine (ZYRTEC) 10 MG tablet Take 10 mg by mouth daily.    Marland Kitchen EPIPEN 2-PAK 0.3 MG/0.3ML DEVI 0.3 mg as needed (environmental allergies).     . Ferrous Fumarate (HEMOCYTE - 106 MG FE) 324 (106 Fe) MG TABS tablet Take 1 tablet by mouth daily.    Marland Kitchen ibuprofen (ADVIL,MOTRIN) 800 MG tablet Take 1 tablet (800 mg total) by mouth every 8 (eight) hours as needed. (Patient taking differently: Take 800 mg by mouth every 8 (eight) hours as needed for moderate pain. ) 30 tablet 1  . MIRENA, 52 MG, 20 MCG/24HR IUD 1 each by Intrauterine route once.     . Omega-3 Fatty Acids (FISH OIL) 1200 MG CAPS Take 1 capsule by mouth 4 (four) times a week.     . Vitamin D, Ergocalciferol, (DRISDOL) 50000 units CAPS capsule Take 1 capsule (50,000 Units total) by mouth every 7 (seven) days. 30 capsule 3   No current facility-administered medications for this visit.  ROS: {Ros - complete:30496}  Exam:    BP 116/72 (BP Location: Right Arm, Patient Position: Sitting, Cuff Size: Large)   Pulse 82   Resp 14   Ht 5\' 1"  (1.549 m)   Wt 202 lb (91.6 kg)   LMP 04/13/2017   BMI 38.17 kg/m   General appearance: alert and cooperative Head: Normocephalic, without obvious abnormality, atraumatic Neck: no adenopathy, supple, symmetrical, trachea midline and thyroid not enlarged, symmetric, no tenderness/mass/nodules Lungs: clear to auscultation bilaterally Heart: regular rate and rhythm, S1, S2 normal, no murmur, click, rub or gallop Abdomen: soft, non-tender;  bowel sounds normal; no masses,  no organomegaly Extremities: extremities normal, atraumatic, no cyanosis or edema Skin: Skin color, texture, turgor normal. No rashes or lesions Lymph nodes: Cervical, supraclavicular, and axillary nodes normal. no inguinal nodes palpated Neurologic: Grossly normal  Pelvic: External genitalia:  no lesions              Urethra: normal appearing urethra with no masses, tenderness or lesions              Bartholins and Skenes: {EXAM; GYN EHOZY:24825}                 Vagina: {vagina:315903::"normal appearing vagina with normal color and discharge, no lesions"}              Cervix: {exam; gyn cervix:30847}              Pap taken: {yes no:314532}        Bimanual Exam:  Uterus:  {uterus:315905::"uterus is normal size, shape, consistency and nontender"}                                      Adnexa:    {adnexa:311645::"not indicated"}                                      Rectovaginal: Deferred                                      Anus:  {Exam; anus:16940}  A: {CCO Gynecologic Problems:21020265}     P:  *** planned Rx for Motrin and Percocet given. Medications/Vitamins reviewed.  Pt knows needs to stop ***. Hysterectomy brochure given for pre and post op instructions.

## 2017-04-29 NOTE — Patient Instructions (Addendum)
Your procedure is scheduled on: Tuesday May 11, 2017 at 7:30 am  Enter through the [employer] [facility] at: 6:00 am  Pick up the phone at the desk and dial [phone].  Call this number if you have problems the morning of surgery: [phone].  Remember: Do NOT eat food or drink and fluids: after Midnight on Monday August 13th : Take these medicines the morning of surgery with a SIP OF WATER: xanax  BRING ALBUTEROL INHALER WITH YOU DAY OF SURGERY  STOP ALL VITAMINS AND SUPPLEMENTS TODAY  Do NOT wear jewelry (body piercing), metal hair clips/bobby pins, make-up, or nail polish. Do NOT wear lotions, powders, or perfumes.  You may wear deoderant. Do NOT shave for 48 hours prior to surgery. Do NOT bring valuables to the hospital. Contacts, dentures, or bridgework may not be worn into surgery.  Leave suitcase in car.  After surgery it may be brought to your room.  For patients admitted to the hospital, checkout time is 11:00 AM the day of discharge.

## 2017-04-30 ENCOUNTER — Encounter: Payer: Self-pay | Admitting: Obstetrics & Gynecology

## 2017-05-10 MED ORDER — ENOXAPARIN SODIUM 40 MG/0.4ML ~~LOC~~ SOLN
40.0000 mg | SUBCUTANEOUS | Status: AC
  Administered 2017-05-11: 40 mg via SUBCUTANEOUS
  Filled 2017-05-10: qty 0.4

## 2017-05-11 ENCOUNTER — Ambulatory Visit (HOSPITAL_COMMUNITY): Payer: BLUE CROSS/BLUE SHIELD | Admitting: Anesthesiology

## 2017-05-11 ENCOUNTER — Encounter (HOSPITAL_COMMUNITY): Payer: Self-pay

## 2017-05-11 ENCOUNTER — Ambulatory Visit (HOSPITAL_COMMUNITY)
Admission: RE | Admit: 2017-05-11 | Discharge: 2017-05-12 | Disposition: A | Discharge status: Home / Self Care | Payer: BLUE CROSS/BLUE SHIELD | Source: Ambulatory Visit | Attending: Obstetrics & Gynecology | Admitting: Obstetrics & Gynecology

## 2017-05-11 ENCOUNTER — Encounter (HOSPITAL_COMMUNITY)
Admission: RE | Disposition: A | Discharge status: Home / Self Care | Payer: Self-pay | Source: Ambulatory Visit | Attending: Obstetrics & Gynecology

## 2017-05-11 DIAGNOSIS — D251 Intramural leiomyoma of uterus: Secondary | ICD-10-CM | POA: Diagnosis not present

## 2017-05-11 DIAGNOSIS — J45909 Unspecified asthma, uncomplicated: Secondary | ICD-10-CM | POA: Diagnosis not present

## 2017-05-11 DIAGNOSIS — Q8500 Neurofibromatosis, unspecified: Secondary | ICD-10-CM | POA: Diagnosis not present

## 2017-05-11 DIAGNOSIS — F419 Anxiety disorder, unspecified: Secondary | ICD-10-CM | POA: Insufficient documentation

## 2017-05-11 DIAGNOSIS — Z30432 Encounter for removal of intrauterine contraceptive device: Secondary | ICD-10-CM | POA: Diagnosis not present

## 2017-05-11 DIAGNOSIS — Z975 Presence of (intrauterine) contraceptive device: Secondary | ICD-10-CM | POA: Insufficient documentation

## 2017-05-11 DIAGNOSIS — N920 Excessive and frequent menstruation with regular cycle: Secondary | ICD-10-CM | POA: Insufficient documentation

## 2017-05-11 DIAGNOSIS — N7011 Chronic salpingitis: Secondary | ICD-10-CM | POA: Diagnosis not present

## 2017-05-11 DIAGNOSIS — D649 Anemia, unspecified: Secondary | ICD-10-CM | POA: Insufficient documentation

## 2017-05-11 DIAGNOSIS — K660 Peritoneal adhesions (postprocedural) (postinfection): Secondary | ICD-10-CM | POA: Diagnosis not present

## 2017-05-11 DIAGNOSIS — N879 Dysplasia of cervix uteri, unspecified: Secondary | ICD-10-CM | POA: Diagnosis not present

## 2017-05-11 DIAGNOSIS — N736 Female pelvic peritoneal adhesions (postinfective): Secondary | ICD-10-CM | POA: Diagnosis not present

## 2017-05-11 DIAGNOSIS — N808 Other endometriosis: Secondary | ICD-10-CM | POA: Diagnosis not present

## 2017-05-11 DIAGNOSIS — D500 Iron deficiency anemia secondary to blood loss (chronic): Secondary | ICD-10-CM

## 2017-05-11 DIAGNOSIS — N800 Endometriosis of uterus: Secondary | ICD-10-CM | POA: Diagnosis not present

## 2017-05-11 DIAGNOSIS — N809 Endometriosis, unspecified: Secondary | ICD-10-CM | POA: Diagnosis not present

## 2017-05-11 DIAGNOSIS — N938 Other specified abnormal uterine and vaginal bleeding: Secondary | ICD-10-CM | POA: Diagnosis not present

## 2017-05-11 DIAGNOSIS — D259 Leiomyoma of uterus, unspecified: Secondary | ICD-10-CM | POA: Diagnosis not present

## 2017-05-11 HISTORY — PX: LAPAROSCOPIC LYSIS OF ADHESIONS: SHX5905

## 2017-05-11 HISTORY — PX: TOTAL LAPAROSCOPIC HYSTERECTOMY WITH SALPINGECTOMY: SHX6742

## 2017-05-11 HISTORY — PX: IUD REMOVAL: SHX5392

## 2017-05-11 HISTORY — PX: CYSTOSCOPY: SHX5120

## 2017-05-11 LAB — HCG, SERUM, QUALITATIVE: PREG SERUM: NEGATIVE

## 2017-05-11 LAB — HEMOGLOBIN: Hemoglobin: 11.5 g/dL — ABNORMAL LOW (ref 12.0–15.0)

## 2017-05-11 SURGERY — HYSTERECTOMY, TOTAL, LAPAROSCOPIC, WITH SALPINGECTOMY
Anesthesia: General

## 2017-05-11 MED ORDER — MORPHINE SULFATE (PF) 0.5 MG/ML IJ SOLN
INTRAMUSCULAR | Status: AC
  Filled 2017-05-11: qty 10

## 2017-05-11 MED ORDER — GLYCOPYRROLATE 0.2 MG/ML IJ SOLN
INTRAMUSCULAR | Status: DC | PRN
  Administered 2017-05-11: 0.1 mg via INTRAVENOUS

## 2017-05-11 MED ORDER — PROPOFOL 10 MG/ML IV BOLUS
INTRAVENOUS | Status: AC
  Filled 2017-05-11: qty 20

## 2017-05-11 MED ORDER — HYDROMORPHONE HCL 1 MG/ML IJ SOLN
INTRAMUSCULAR | Status: DC | PRN
  Administered 2017-05-11: 1 mg via INTRAVENOUS

## 2017-05-11 MED ORDER — KETOROLAC TROMETHAMINE 30 MG/ML IJ SOLN: 30.0000 mg | Freq: Four times a day (QID) | INTRAMUSCULAR | Status: DC

## 2017-05-11 MED ORDER — PROMETHAZINE HCL 25 MG/ML IJ SOLN: 6.2500 mg | INTRAMUSCULAR | Status: DC | PRN

## 2017-05-11 MED ORDER — BUPIVACAINE HCL (PF) 0.25 % IJ SOLN
INTRAMUSCULAR | Status: AC
  Filled 2017-05-11: qty 30

## 2017-05-11 MED ORDER — LIDOCAINE HCL (CARDIAC) 20 MG/ML IV SOLN
INTRAVENOUS | Status: DC | PRN
  Administered 2017-05-11: 100 mg via INTRAVENOUS

## 2017-05-11 MED ORDER — ONDANSETRON HCL 4 MG/2ML IJ SOLN
INTRAMUSCULAR | Status: AC
  Filled 2017-05-11: qty 2

## 2017-05-11 MED ORDER — OXYCODONE HCL 5 MG/5ML PO SOLN: 5.0000 mg | Freq: Once | ORAL | Status: DC | PRN

## 2017-05-11 MED ORDER — MIDAZOLAM HCL 2 MG/2ML IJ SOLN
INTRAMUSCULAR | Status: AC
  Filled 2017-05-11: qty 2

## 2017-05-11 MED ORDER — FENTANYL CITRATE (PF) 100 MCG/2ML IJ SOLN
INTRAMUSCULAR | Status: AC
  Filled 2017-05-11: qty 2

## 2017-05-11 MED ORDER — ROCURONIUM BROMIDE 100 MG/10ML IV SOLN
INTRAVENOUS | Status: AC
  Filled 2017-05-11: qty 1

## 2017-05-11 MED ORDER — ALBUTEROL SULFATE HFA 108 (90 BASE) MCG/ACT IN AERS
INHALATION_SPRAY | RESPIRATORY_TRACT | Status: DC | PRN
  Administered 2017-05-11 (×2): 2 via RESPIRATORY_TRACT

## 2017-05-11 MED ORDER — SCOPOLAMINE 1 MG/3DAYS TD PT72
1.0000 | MEDICATED_PATCH | Freq: Once | TRANSDERMAL | Status: DC
  Administered 2017-05-11: 1.5 mg via TRANSDERMAL

## 2017-05-11 MED ORDER — OXYCODONE HCL 5 MG PO TABS: 5.0000 mg | ORAL_TABLET | Freq: Once | ORAL | Status: DC | PRN

## 2017-05-11 MED ORDER — PHENYLEPHRINE HCL 10 MG/ML IJ SOLN: INTRAMUSCULAR | Status: DC | PRN

## 2017-05-11 MED ORDER — SODIUM CHLORIDE 0.9 % IJ SOLN
INTRAMUSCULAR | Status: AC
  Filled 2017-05-11: qty 10

## 2017-05-11 MED ORDER — IBUPROFEN 800 MG PO TABS
800.0000 mg | ORAL_TABLET | Freq: Three times a day (TID) | ORAL | Status: DC | PRN
  Administered 2017-05-12: 800 mg via ORAL
  Filled 2017-05-11: qty 1

## 2017-05-11 MED ORDER — DEXAMETHASONE SODIUM PHOSPHATE 10 MG/ML IJ SOLN
INTRAMUSCULAR | Status: DC | PRN
  Administered 2017-05-11: 10 mg via INTRAVENOUS

## 2017-05-11 MED ORDER — LACTATED RINGERS IV SOLN
INTRAVENOUS | Status: DC
  Administered 2017-05-11 (×3): via INTRAVENOUS

## 2017-05-11 MED ORDER — MIDAZOLAM HCL 2 MG/2ML IJ SOLN
INTRAMUSCULAR | Status: DC | PRN
  Administered 2017-05-11: 2 mg via INTRAVENOUS

## 2017-05-11 MED ORDER — ONDANSETRON HCL 4 MG/2ML IJ SOLN
INTRAMUSCULAR | Status: DC | PRN
  Administered 2017-05-11: 4 mg via INTRAVENOUS

## 2017-05-11 MED ORDER — ALUM & MAG HYDROXIDE-SIMETH 200-200-20 MG/5ML PO SUSP: 30.0000 mL | ORAL | Status: DC | PRN

## 2017-05-11 MED ORDER — OXYCODONE-ACETAMINOPHEN 5-325 MG PO TABS
1.0000 | ORAL_TABLET | ORAL | Status: DC | PRN
  Administered 2017-05-12: 2 via ORAL
  Filled 2017-05-11: qty 2

## 2017-05-11 MED ORDER — MENTHOL 3 MG MT LOZG: 1.0000 | LOZENGE | OROMUCOSAL | Status: DC | PRN

## 2017-05-11 MED ORDER — FENTANYL CITRATE (PF) 250 MCG/5ML IJ SOLN
INTRAMUSCULAR | Status: AC
Start: 2017-05-11 — End: 2017-05-11
  Filled 2017-05-11: qty 5

## 2017-05-11 MED ORDER — ALBUTEROL SULFATE (2.5 MG/3ML) 0.083% IN NEBU: 3.0000 mL | INHALATION_SOLUTION | Freq: Four times a day (QID) | RESPIRATORY_TRACT | Status: DC | PRN

## 2017-05-11 MED ORDER — MEPERIDINE HCL 25 MG/ML IJ SOLN: 6.2500 mg | INTRAMUSCULAR | Status: DC | PRN

## 2017-05-11 MED ORDER — SIMETHICONE 80 MG PO CHEW
80.0000 mg | CHEWABLE_TABLET | Freq: Four times a day (QID) | ORAL | Status: DC | PRN
  Administered 2017-05-11 – 2017-05-12 (×2): 80 mg via ORAL
  Filled 2017-05-11 (×2): qty 1

## 2017-05-11 MED ORDER — HYDROMORPHONE HCL 1 MG/ML IJ SOLN: 0.2500 mg | INTRAMUSCULAR | Status: DC | PRN

## 2017-05-11 MED ORDER — HYDROMORPHONE HCL 1 MG/ML IJ SOLN
INTRAMUSCULAR | Status: AC
  Filled 2017-05-11: qty 1

## 2017-05-11 MED ORDER — SUGAMMADEX SODIUM 500 MG/5ML IV SOLN
INTRAVENOUS | Status: AC
  Filled 2017-05-11: qty 5

## 2017-05-11 MED ORDER — KETOROLAC TROMETHAMINE 30 MG/ML IJ SOLN
INTRAMUSCULAR | Status: AC
  Filled 2017-05-11: qty 1

## 2017-05-11 MED ORDER — KETOROLAC TROMETHAMINE 30 MG/ML IJ SOLN
30.0000 mg | Freq: Four times a day (QID) | INTRAMUSCULAR | Status: DC
  Administered 2017-05-11 (×2): 30 mg via INTRAVENOUS
  Filled 2017-05-11 (×3): qty 1

## 2017-05-11 MED ORDER — EPHEDRINE 5 MG/ML INJ
INTRAVENOUS | Status: AC
  Filled 2017-05-11: qty 10

## 2017-05-11 MED ORDER — BUPIVACAINE HCL (PF) 0.25 % IJ SOLN
INTRAMUSCULAR | Status: DC | PRN
  Administered 2017-05-11: 5.5 mL

## 2017-05-11 MED ORDER — FENTANYL CITRATE (PF) 100 MCG/2ML IJ SOLN
INTRAMUSCULAR | Status: DC | PRN
  Administered 2017-05-11 (×4): 100 ug via INTRAVENOUS
  Administered 2017-05-11: 50 ug via INTRAVENOUS
  Administered 2017-05-11 (×2): 100 ug via INTRAVENOUS

## 2017-05-11 MED ORDER — CEFOTETAN DISODIUM-DEXTROSE 2-2.08 GM-% IV SOLR
INTRAVENOUS | Status: AC
  Filled 2017-05-11: qty 50

## 2017-05-11 MED ORDER — STERILE WATER FOR IRRIGATION IR SOLN
Status: DC | PRN
  Administered 2017-05-11: 1000 mL

## 2017-05-11 MED ORDER — PROPOFOL 10 MG/ML IV BOLUS
INTRAVENOUS | Status: DC | PRN
  Administered 2017-05-11: 20 mg via INTRAVENOUS
  Administered 2017-05-11: 180 mg via INTRAVENOUS

## 2017-05-11 MED ORDER — DEXTROSE-NACL 5-0.45 % IV SOLN: INTRAVENOUS | Status: DC

## 2017-05-11 MED ORDER — SODIUM CHLORIDE 0.9 % IJ SOLN
INTRAMUSCULAR | Status: AC
  Filled 2017-05-11: qty 50

## 2017-05-11 MED ORDER — CEFOTETAN DISODIUM-DEXTROSE 2-2.08 GM-% IV SOLR
2.0000 g | INTRAVENOUS | Status: AC
  Administered 2017-05-11: 2 g via INTRAVENOUS

## 2017-05-11 MED ORDER — SCOPOLAMINE 1 MG/3DAYS TD PT72
MEDICATED_PATCH | TRANSDERMAL | Status: AC
  Administered 2017-05-11: 1.5 mg via TRANSDERMAL
  Filled 2017-05-11: qty 1

## 2017-05-11 MED ORDER — ROCURONIUM BROMIDE 100 MG/10ML IV SOLN
INTRAVENOUS | Status: DC | PRN
  Administered 2017-05-11: 50 mg via INTRAVENOUS
  Administered 2017-05-11: 10 mg via INTRAVENOUS
  Administered 2017-05-11: 20 mg via INTRAVENOUS

## 2017-05-11 MED ORDER — LIDOCAINE HCL (CARDIAC) 20 MG/ML IV SOLN
INTRAVENOUS | Status: AC
  Filled 2017-05-11: qty 5

## 2017-05-11 MED ORDER — DEXAMETHASONE SODIUM PHOSPHATE 10 MG/ML IJ SOLN
INTRAMUSCULAR | Status: AC
  Filled 2017-05-11: qty 1

## 2017-05-11 MED ORDER — FENTANYL CITRATE (PF) 250 MCG/5ML IJ SOLN
INTRAMUSCULAR | Status: AC
  Filled 2017-05-11: qty 5

## 2017-05-11 MED ORDER — SUGAMMADEX SODIUM 500 MG/5ML IV SOLN
INTRAVENOUS | Status: DC | PRN
  Administered 2017-05-11: 183.4 mg via INTRAVENOUS

## 2017-05-11 MED ORDER — ACETAMINOPHEN 325 MG PO TABS: 650.0000 mg | ORAL_TABLET | ORAL | Status: DC | PRN

## 2017-05-11 MED ORDER — PHENYLEPHRINE 40 MCG/ML (10ML) SYRINGE FOR IV PUSH (FOR BLOOD PRESSURE SUPPORT)
PREFILLED_SYRINGE | INTRAVENOUS | Status: AC
  Filled 2017-05-11: qty 10

## 2017-05-11 MED ORDER — ROPIVACAINE HCL 5 MG/ML IJ SOLN
INTRAMUSCULAR | Status: AC
  Filled 2017-05-11: qty 30

## 2017-05-11 MED ORDER — KETOROLAC TROMETHAMINE 30 MG/ML IJ SOLN
INTRAMUSCULAR | Status: DC | PRN
  Administered 2017-05-11: 30 mg via INTRAVENOUS

## 2017-05-11 MED ORDER — PANTOPRAZOLE SODIUM 40 MG IV SOLR
40.0000 mg | Freq: Every day | INTRAVENOUS | Status: DC
  Administered 2017-05-11: 40 mg via INTRAVENOUS
  Filled 2017-05-11: qty 40

## 2017-05-11 SURGICAL SUPPLY — 52 items
APPLICATOR ARISTA FLEXITIP XL (MISCELLANEOUS) IMPLANT
CABLE HIGH FREQUENCY MONO STRZ (ELECTRODE) IMPLANT
CLOTH BEACON ORANGE TIMEOUT ST (SAFETY) ×5 IMPLANT
COVER LIGHT HANDLE  1/PK (MISCELLANEOUS) ×2
COVER LIGHT HANDLE 1/PK (MISCELLANEOUS) ×3 IMPLANT
COVER MAYO STAND STRL (DRAPES) ×5 IMPLANT
DERMABOND ADHESIVE PROPEN (GAUZE/BANDAGES/DRESSINGS) ×2
DERMABOND ADVANCED .7 DNX6 (GAUZE/BANDAGES/DRESSINGS) ×3 IMPLANT
DURAPREP 26ML APPLICATOR (WOUND CARE) ×5 IMPLANT
GLOVE BIOGEL PI IND STRL 7.0 (GLOVE) ×15 IMPLANT
GLOVE BIOGEL PI INDICATOR 7.0 (GLOVE) ×10
GLOVE ECLIPSE 6.5 STRL STRAW (GLOVE) ×30 IMPLANT
GOWN STRL REUS W/TWL LRG LVL3 (GOWN DISPOSABLE) ×20 IMPLANT
HEMOSTAT ARISTA ABSORB 3G PWDR (MISCELLANEOUS) ×5 IMPLANT
LIGASURE VESSEL 5MM BLUNT TIP (ELECTROSURGICAL) ×5 IMPLANT
NEEDLE INSUFFLATION 120MM (ENDOMECHANICALS) ×5 IMPLANT
NS IRRIG 1000ML POUR BTL (IV SOLUTION) ×5 IMPLANT
OCCLUDER COLPOPNEUMO (BALLOONS) ×5 IMPLANT
PACK LAPAROSCOPY BASIN (CUSTOM PROCEDURE TRAY) ×5 IMPLANT
PACK TRENDGUARD 450 HYBRID PRO (MISCELLANEOUS) IMPLANT
PACK TRENDGUARD 600 HYBRD PROC (MISCELLANEOUS) IMPLANT
POUCH LAPAROSCOPIC INSTRUMENT (MISCELLANEOUS) ×5 IMPLANT
PROTECTOR NERVE ULNAR (MISCELLANEOUS) ×10 IMPLANT
SCISSORS LAP 5X35 DISP (ENDOMECHANICALS) ×5 IMPLANT
SET CYSTO W/LG BORE CLAMP LF (SET/KITS/TRAYS/PACK) IMPLANT
SET IRRIG TUBING LAPAROSCOPIC (IRRIGATION / IRRIGATOR) ×5 IMPLANT
SET TRI-LUMEN FLTR TB AIRSEAL (TUBING) ×5 IMPLANT
SHEARS HARMONIC ACE PLUS 36CM (ENDOMECHANICALS) ×5 IMPLANT
SOLUTION ELECTROLUBE (MISCELLANEOUS) ×5 IMPLANT
SUT VIC AB 0 CT1 27 (SUTURE) ×4
SUT VIC AB 0 CT1 27XBRD ANBCTR (SUTURE) ×6 IMPLANT
SUT VIC AB 4-0 PS2 27 (SUTURE) ×10 IMPLANT
SUT VICRYL 0 UR6 27IN ABS (SUTURE) ×5 IMPLANT
SUT VICRYL 4-0 PS2 18IN ABS (SUTURE) ×5 IMPLANT
SUT VLOC 180 0 9IN  GS21 (SUTURE) ×2
SUT VLOC 180 0 9IN GS21 (SUTURE) ×3 IMPLANT
SUT VLOC 180 3-0 9IN GS21 (SUTURE) ×5 IMPLANT
SYR 50ML LL SCALE MARK (SYRINGE) ×10 IMPLANT
SYRINGE 10CC LL (SYRINGE) ×5 IMPLANT
SYSTEM CARTER THOMASON II (TROCAR) IMPLANT
TIP UTERINE 5.1X6CM LAV DISP (MISCELLANEOUS) IMPLANT
TIP UTERINE 6.7X10CM GRN DISP (MISCELLANEOUS) IMPLANT
TIP UTERINE 6.7X6CM WHT DISP (MISCELLANEOUS) IMPLANT
TIP UTERINE 6.7X8CM BLUE DISP (MISCELLANEOUS) IMPLANT
TOWEL OR 17X24 6PK STRL BLUE (TOWEL DISPOSABLE) ×10 IMPLANT
TRENDGUARD 450 HYBRID PRO PACK (MISCELLANEOUS)
TRENDGUARD 600 HYBRID PROC PK (MISCELLANEOUS)
TROCAR ADV FIXATION 5X100MM (TROCAR) ×5 IMPLANT
TROCAR PORT AIRSEAL 5X120 (TROCAR) ×5 IMPLANT
TROCAR XCEL NON BLADE 8MM B8LT (ENDOMECHANICALS) ×5 IMPLANT
TROCAR XCEL NON-BLD 5MMX100MML (ENDOMECHANICALS) ×5 IMPLANT
WARMER LAPAROSCOPE (MISCELLANEOUS) ×5 IMPLANT

## 2017-05-11 NOTE — Progress Notes (Signed)
Day of Surgery Procedure(s) (LRB): HYSTERECTOMY TOTAL LAPAROSCOPIC WITH LEFT SALPINGECTOMY (Bilateral) CYSTOSCOPY (N/A) INTRAUTERINE DEVICE (IUD) REMOVAL (N/A) LAPAROSCOPIC LYSIS OF ADHESIONS  Subjective: Patient reports good pain control.  Had some nausea earlier but this has resolved.  Has voided x 1.  Has not had anything but ice chips.  Minimal bleeding.  Objective: I have reviewed patient's vital signs, intake and output, medications and labs. Hb:  11.5  General: alert, cooperative and no distress Resp: clear to auscultation bilaterally Cardio: regular rate and rhythm, S1, S2 normal, no murmur, click, rub or gallop GI: soft, non-tender; bowel sounds normal; no masses,  no organomegaly and incision: clean, dry and intact Extremities: extremities normal, atraumatic, no cyanosis or edema Vaginal Bleeding: minimal  Assessment: s/p Procedure(s) with comments: HYSTERECTOMY TOTAL LAPAROSCOPIC WITH LEFT SALPINGECTOMY (Bilateral) CYSTOSCOPY (N/A) INTRAUTERINE DEVICE (IUD) REMOVAL (N/A) LAPAROSCOPIC LYSIS OF ADHESIONS - with cautery of endometroisis: stable and progressing well  Plan: Advance diet Encourage ambulation  Continue SCDs Continue to moniitor  LOS: 0 days    Lyman Speller 05/11/2017, 6:17 PM

## 2017-05-11 NOTE — Anesthesia Preprocedure Evaluation (Addendum)
Anesthesia Evaluation  Patient identified by MRN, date of birth, ID band Patient awake    Reviewed: Allergy & Precautions, NPO status , Patient's Chart, lab work & pertinent test results  Airway Mallampati: II  TM Distance: >3 FB Neck ROM: Full    Dental no notable dental hx. (+) Teeth Intact, Dental Advisory Given   Pulmonary asthma ,    Pulmonary exam normal breath sounds clear to auscultation       Cardiovascular negative cardio ROS Normal cardiovascular exam Rhythm:Regular Rate:Normal     Neuro/Psych Anxiety negative neurological ROS  negative psych ROS   GI/Hepatic negative GI ROS, Neg liver ROS,   Endo/Other  negative endocrine ROS  Renal/GU negative Renal ROS  negative genitourinary   Musculoskeletal negative musculoskeletal ROS (+)   Abdominal   Peds negative pediatric ROS (+)  Hematology negative hematology ROS (+) anemia ,   Anesthesia Other Findings Seasonal and perennial allergic rhinitis Asthma, mild intermittent Fibroids Endometriosis Anemia due to chronic blood loss Greater trochanteric bursitis of left hip Neurofibromatosis     Reproductive/Obstetrics negative OB ROS                                                             Anesthesia Evaluation  Patient identified by MRN, date of birth, ID band Patient awake    Reviewed: Allergy & Precautions, NPO status , Patient's Chart, lab work & pertinent test results  Airway Mallampati: II  TM Distance: >3 FB Neck ROM: Full    Dental no notable dental hx. (+) Teeth Intact, Dental Advisory Given   Pulmonary asthma ,    Pulmonary exam normal breath sounds clear to auscultation       Cardiovascular negative cardio ROS Normal cardiovascular exam Rhythm:Regular Rate:Normal     Neuro/Psych negative neurological ROS  negative psych ROS   GI/Hepatic negative GI ROS, Neg liver ROS,   Endo/Other   negative endocrine ROS  Renal/GU negative Renal ROS  negative genitourinary   Musculoskeletal negative musculoskeletal ROS (+)   Abdominal   Peds negative pediatric ROS (+)  Hematology negative hematology ROS (+) anemia ,   Anesthesia Other Findings   Reproductive/Obstetrics negative OB ROS                           Anesthesia Physical Anesthesia Plan  ASA: II  Anesthesia Plan: General   Post-op Pain Management:    Induction: Intravenous  Airway Management Planned: LMA  Additional Equipment:   Intra-op Plan:   Post-operative Plan: Extubation in OR  Informed Consent: I have reviewed the patients History and Physical, chart, labs and discussed the procedure including the risks, benefits and alternatives for the proposed anesthesia with the patient or authorized representative who has indicated his/her understanding and acceptance.   Dental advisory given  Plan Discussed with: CRNA and Surgeon  Anesthesia Plan Comments:         Anesthesia Quick Evaluation  Anesthesia Physical  Anesthesia Plan  ASA: II  Anesthesia Plan: General   Post-op Pain Management:    Induction: Intravenous  PONV Risk Score and Plan: 3 and Ondansetron, Dexamethasone, Midazolam, Treatment may vary due to age or medical condition and Scopolamine patch - Pre-op  Airway Management Planned: Oral ETT  Additional Equipment:  Intra-op Plan:   Post-operative Plan: Extubation in OR  Informed Consent: I have reviewed the patients History and Physical, chart, labs and discussed the procedure including the risks, benefits and alternatives for the proposed anesthesia with the patient or authorized representative who has indicated his/her understanding and acceptance.   Dental advisory given  Plan Discussed with: CRNA and Surgeon  Anesthesia Plan Comments:        Anesthesia Quick Evaluation

## 2017-05-11 NOTE — H&P (Signed)
Lacey Lee is an 41 y.o. female G69A2 SAAF here for definitive treatment of symptomatic uterine fibroids.  Pt has experienced issues with fibroids for the past several years, undergoing a laparoscopic myomectomy in 3/14 and then hysteroscopic myomectomy in 11/17.  She has a Mirena IUD present that has helped some. She is only mildly anemic at this time which is improved from the past.  She does not have a right fallopian tube due to ectopic pregnancy in her early 20's and has a non patent tube on the left side.  She is fine with no future child bearing.  Risks and benefits as well as continuing to follow conservatively vs medication therapy has been discussed.  All questions answered.  Pt here and ready to proceed.  Pertinent Gynecological History: Menses: heavy at times and irregular Bleeding: dysfunctional uterine bleeding Contraception: mirena IUD DES exposure: denies Blood transfusions: none Sexually transmitted diseases: no past history Previous GYN Procedures: myomectomy and hysteroscopic myomectomy  Last mammogram: normal Date: 6/18 Last pap: normal Date: 2/17 OB History: G2, P0   Menstrual History: Patient's last menstrual period was 04/08/2017 (exact date).    Past Medical History:  Diagnosis Date  . Anxiety    slight  . Asthma    albuterol rescue inhaler-uses prn, exacerbated by pet dander  . History of ectopic pregnancy 1999   S/P RIGHT SALPINGECTOMY  . History of herpes simplex type 2 infection 2005  . History of uterine fibroid   . Iron deficiency anemia   . Menorrhagia   . Neurofibromatosis [facility])    dx is child--  nonmalignant  . Pneumonia    years ago  . Seasonal allergies   . Uterine fibroid     Past Surgical History:  Procedure Laterality Date  . DILATATION & CURETTAGE/HYSTEROSCOPY WITH MYOSURE N/A 08/07/2016   Procedure: HYSTEROSCOPY WITH MYOSURE;  Surgeon: Lacey Salon, MD;  Location: [facility];  Service: Gynecology;  Laterality: N/A;   Fibroid resection  . IUD REMOVAL N/A 08/07/2016   Procedure: INTRAUTERINE DEVICE (IUD) REMOVAL;  Surgeon: Lacey Salon, MD;  Location: [facility];  Service: Gynecology;  Laterality: N/A;  . LAPAROSCOPY FOR ECTOPIC PREGNANCY  2000   RIGHT SALPINGECTOMY  . ROBOT ASSISTED MYOMECTOMY N/A 12/16/2012   Procedure: ROBOTIC ASSISTED MYOMECTOMY;  Surgeon: Lacey Specking, MD;  Location: [facility];  Service: Gynecology;  Laterality: N/A;  . ROBOTIC ASSISTED LAPAROSCOPIC LYSIS OF ADHESION  12/16/2012   Procedure: ROBOTIC ASSISTED LAPAROSCOPIC LYSIS OF ADHESION;  Surgeon: Lacey Specking, MD;  Location: [facility];  Service: Gynecology;;  with excision of perotoneal lesions  . WISDOM TOOTH EXTRACTION      Family History  Problem Relation Age of Onset  . Allergies Mother        food  . Hypertension Mother   . Hyperlipidemia Mother   . Heart failure Mother   . Thyroid disease Mother   . Diabetes Mother   . Lung cancer Father   . Diabetes Sister   . Hypertension Sister   . Uterine cancer Paternal Grandmother   . Breast cancer Neg Hx     Social History:  reports that she has never smoked. She has never used smokeless tobacco. She reports that she drinks alcohol. She reports that she does not use drugs.  Allergies:  Allergies  Allergen Reactions  . Latex Other (See Comments)    DERMITITIS  . Other Hives and Swelling    PORK (alphagal)-lip/tongue swelling, hives  . Seasonal  Ic [Cholestatin]   . Tramadol Nausea And Vomiting    Prescriptions Prior to Admission  Medication Sig Dispense Refill Last Dose  . cetirizine (ZYRTEC) 10 MG tablet Take 10 mg by mouth daily.   Past Week at Unknown time  . EPIPEN 2-PAK 0.3 MG/0.3ML DEVI 0.3 mg as needed (environmental allergies).    Taking  . Ferrous Fumarate (HEMOCYTE - 106 MG FE) 324 (106 Fe) MG TABS tablet Take 1 tablet by mouth daily.   Past Week at Unknown time  . ibuprofen (ADVIL,MOTRIN) 800 MG tablet Take 1 tablet (800 mg total) by  mouth every 8 (eight) hours as needed. (Patient taking differently: Take 800 mg by mouth every 8 (eight) hours as needed for moderate pain. ) 30 tablet 1 Past Week at Unknown time  . MIRENA, 52 MG, 20 MCG/24HR IUD 1 each by Intrauterine route once.    Taking  . Omega-3 Fatty Acids (FISH OIL) 1200 MG CAPS Take 1 capsule by mouth 4 (four) times a week.    Past Week at Unknown time  . Vitamin D, Ergocalciferol, (DRISDOL) 50000 units CAPS capsule Take 1 capsule (50,000 Units total) by mouth every 7 (seven) days. 30 capsule 3 Past Week at Unknown time  . albuterol (PROVENTIL HFA;VENTOLIN HFA) 108 (90 BASE) MCG/ACT inhaler Inhale 2 puffs into the lungs every 6 (six) hours as needed for wheezing or shortness of breath.    More than a month at Unknown time  . ALPRAZolam (XANAX) 0.25 MG tablet Take 0.25 mg by mouth daily as needed for anxiety.    More than a month at Unknown time  . Calcium Carb-Cholecalciferol (CALCIUM 600-D PO) Take 2 tablets by mouth daily.    More than a month at Unknown time    Review of Systems  All other systems reviewed and are negative.   Blood pressure 124/73, pulse 71, temperature 98.3 F (36.8 C), temperature source Oral, resp. rate 16, last menstrual period 04/08/2017, SpO2 100 %. Physical Exam  Constitutional: She is oriented to person, place, and time. She appears well-developed and well-nourished.  Cardiovascular: Normal rate and regular rhythm.   Respiratory: Effort normal and breath sounds normal.  Neurological: She is alert and oriented to person, place, and time.  Skin: Skin is warm and dry.  Psychiatric: She has a normal mood and affect.    Results for orders placed or performed during the hospital encounter of 05/11/17 (from the past 24 hour(s))  hCG, serum, qualitative     Status: None   Collection Time: 05/11/17  6:04 AM  Result Value Ref Range   Preg, Serum NEGATIVE NEGATIVE    No results found.  Assessment/Plan: 41 yo G2A2 SAAF here for definitive  treatment of symptomatic uterine fibroids with TLH/bilateral salpingectomy/cystoscopy.  All questions answered.  Pt here and ready to proceed.  Lacey Lee 05/11/2017, 7:07 AM

## 2017-05-11 NOTE — Op Note (Signed)
05/11/2017  11:05 AM  PATIENT:  Lacey Lee  41 y.o. female  PRE-OPERATIVE DIAGNOSIS:  menorrhagia, fibroids, anemia, h/o right salpingectomy due to ectopic pregnancy in her 20's  POST-OPERATIVE DIAGNOSIS:  menorrhagia, fibroids, anemia, endometriosis on right uterosacral ligament, scarred and enlarged left fallopian tube  PROCEDURE:  Procedure(s): HYSTERECTOMY TOTAL LAPAROSCOPIC WITH LEFT SALPINGECTOMY CYSTOSCOPY INTRAUTERINE DEVICE (IUD) REMOVAL LAPAROSCOPIC LYSIS OF ADHESIONS  SURGEON:  Edlyn Rosenburg [name]  ASSISTANTS: Sumner Boast, MD   ANESTHESIA:   general  ESTIMATED BLOOD LOSS: 50cc  BLOOD ADMINISTERED:none   FLUIDS: 2000cc LR  UOP: 300cc clear UOP  SPECIMEN:  Uterus, cervix, left fallopian tube  DISPOSITION OF SPECIMEN:  PATHOLOGY  FINDINGS: enlarged fibroid uterus, adhesions of omentum to abdominal wall underneath umbilicus, lower uterine adhesions  DESCRIPTION OF OPERATION: Patient is taken to the operating room. She is placed in the supine position. She is a running IV in place. Informed consent was present on the chart. SCDs on her lower extremities and functioning properly. Patient was positioned while she was awake.  Her legs were placed in the low lithotomy position in [place]. Her arms were tucked by the side.  General endotracheal anesthesia was administered by the anesthesia staff without difficulty. Dr. Sabra Heck, anesthesia, oversaw case.  Time out performed.    Chlora prep was then used to prep the abdomen and Betadine was used to prep the inner thighs, perineum and vagina. Once 3 minutes had past the patient was draped in a normal standard fashion. The legs were lifted to the high lithotomy position. The cervix was visualized by placing a heavy weighted speculum in the posterior aspect of the vagina and using a curved Deaver retractor to the retract anteriorly. The anterior lip of the cervix was grasped with single-tooth tenaculum.  The cervix  sounded to 9 cm. Pratt dilators were used to dilate the cervix up to a #21. A RUMI uterine manipulator was obtained. A #8disposable tip was placed on the RUMI manipulator as well as a 3.5, silver KOH ring. This was passed through the cervix and the bulb of the disposable tip was inflated with 10 cc of normal saline. There was a good fit of the KOH ring around the cervix. The tenaculum was removed. There is also good manipulation of the uterus. The speculum and retractor were removed as well. A Foley catheter was placed to straight drain.  Clear urine was noted. Legs were lowered to the low lithotomy position and attention was turned the abdomen.  The umbilicus was everted.  A Veress needle was obtained. Syringe of sterile saline was placed on a open Veress needle.  This was passed into the umbilicus until just when the fluid started to drip.  The fluid dripped very slowly.  Needle was removed.  This was attempted again.  Again, fluid dripped but slowly.  Decision made to place LUQ entry.  OG tube placed by anesthesia.  Skin anesthetized with 0.25% Marcaine.  15mm skin incision was made.  36mm non bladed trochar and port passed directly in mid clavicular line, 2cm beneath costal margin without difficulty.  Once intraperitoneal placement was noted, low flow CO2 gas was attached the needle and the pneumoperitoneum was achieved without difficulty. Above findings were noted.  Ovaries were normal although there were some adhesions of the ovary to the left sidwall.  Omental adhesions located beneath umbilicus.  Locations for RLQ, LLQ, and suprapubic ports were noted by transillumination of the abdominal wall.  0.25% marcaine was used to  anesthetize the skin.  79mm skin incision was made in the umbilicus.  An AirSeal port was placed underdirect visualization of the laparoscope in the RLQ.  Then a 21mm skin incision was made and a 53mm nonbladed trochar and port was placed in the LLQ.  Omental adhesions were taken down with  Harmonic scalpel and without difficulty.  Finally, and 73mm trochar and port was placed in the umbilical incision.  Trochars were all removed.     Ureters were identifies.  Attention was turned to the right side. With uterus on stretch the right utero-ovarian pedicle was serially clamped cauterized and incised using the ligasure device.  The right tube had previously been removed.  Right round ligament was serially clamped cauterized and incised. The anterior and posterior peritoneum of the inferior leaf of the broad ligament were opened. The beginning of the bladder flap was created.  The bladder was taken down below the level of the KOH ring. The right uterine artery skeletonized and then just superior to the KOH ring this vessel was serially clamped, cauterized, and incised.  Attention was turned the left side.  The uterus was placed on stretch to the opposite side.  The tube was excised off the ovary using sharp dissection a bipolar cautery.  The mesosalpinx was incised freeing the tube. Then the right uterine ovarian pedicle was serially clamped cauterized and incised. Next the right round ligament was serially clamped cauterized and incised. The anterior posterior peritoneum of the inferiorly for the broad ligament were opened. The anterior peritoneum was carried across to the dissection on the left side. The remainder of the bladder flap was created using sharp dissection. The bladder was well below the level of the KOH ring. The left uterine artery skeletonized. Then the left uterine artery, above the level of the KOH ring, was serially clamped cauterized and incised. The uterus was devascularized at this point.  The colpotomy was performed a starting in the midline and using a harmonic scalpel with the inferior edge of the open blade  This was carried around a circumferential fashion until the vaginal mucosa was completely incised in the specimen was freed.  The specimen was then delivered to the  vagina.  A vaginal occlusive device was used to maintain the pneumoperitoneum.  Instruments were changed with a needle driver and Kobra graspers.  Using a 9 inch V. lock suture, the cuff was closed by incorporating the anterior and posterior vaginal mucosa in each stitch. This was carried across all the way to the left corner and a running fashion. Two stitches were brought back towards the midline and the suture was cut flush with the vagina. The needle was brought out the pelvis. The pelvis was irrigated. All pedicles were inspected. No bleeding was noted. Pneumoperitoneum was relieved to watch the pedicle without pressure.  No bleeding was noted.  Pneumoperitoneum was achieved again.  Ureters were noted deep in the pelvis to be peristalsing.  Arista was placed along pedicles.  At this point the procedure was completed. The largest port was closed with a fascial closure device after the port was removed.  Suture was tied and excellent closure of the fascia was noted.  The remaining instruments were removed.  The ports were removed under direct visualization of the laparoscope and the pneumoperitoneum was relieved.  The patient was taken out of Trendelenburg positioning.  Several deep breaths were given to the patient's trying to any gas the abdomen and finally the midline port was removed.  The skin was then closed with subcuticular stitches of 3-0 Vicryl. The skin was cleansed Dermabond was applied. Attention was then turned the vagina and the cuff was inspected. No bleeding was noted. The anterior posterior vaginal mucosa was incorporated in each stitch. The Foley catheter was removed.  Cystoscopy was performed.  No sutures or bladder injuries were noted.  Ureters were noted with normal urine jets from each one was seen.  Foley was left out after the cystoscopic fluid was drained and cystoscope removed.  Sponge, lap, needle, initially counts were correct x2. Patient tolerated the procedure very well. She was  awakened from anesthesia, extubated and taken to recovery in stable condition.   COUNTS:  YES  PLAN OF CARE: Transfer to PACU

## 2017-05-11 NOTE — Anesthesia Procedure Notes (Signed)
Procedure Name: Intubation Date/Time: 05/11/2017 7:37 AM Performed by: Sahej Hauswirth, Sheron Nightingale Pre-anesthesia Checklist: Patient identified, Emergency Drugs available, Suction available, Patient being monitored and Timeout performed Patient Re-evaluated:Patient Re-evaluated prior to induction Oxygen Delivery Method: Circle system utilized Preoxygenation: Pre-oxygenation with 100% oxygen Induction Type: IV induction Ventilation: Mask ventilation without difficulty Laryngoscope Size: Mac and 3 Grade View: Grade I Tube size: 7.0 mm Number of attempts: 1 Airway Equipment and Method: Patient positioned with wedge pillow Placement Confirmation: ETT inserted through vocal cords under direct vision Secured at: 21 cm Dental Injury: Teeth and Oropharynx as per pre-operative assessment

## 2017-05-11 NOTE — Transfer of Care (Signed)
Immediate Anesthesia Transfer of Care Note  Patient: Lacey Lee  Procedure(s) Performed: Procedure(s) with comments: HYSTERECTOMY TOTAL LAPAROSCOPIC WITH LEFT SALPINGECTOMY (Bilateral) CYSTOSCOPY (N/A) INTRAUTERINE DEVICE (IUD) REMOVAL (N/A) LAPAROSCOPIC LYSIS OF ADHESIONS - with cautery of endometroisis  Patient Location: PACU  Anesthesia Type:General  Level of Consciousness: awake, alert  and oriented  Airway & Oxygen Therapy: Patient Spontanous Breathing and Patient connected to nasal cannula oxygen  Post-op Assessment: Report given to RN and Post -op Vital signs reviewed and stable  Post vital signs: Reviewed and stable  Last Vitals:  Vitals:   05/11/17 0616  BP: 124/73  Pulse: 71  Resp: 16  Temp: 36.8 C  SpO2: 100%    Last Pain:  Vitals:   05/11/17 0616  TempSrc: Oral  PainSc: 0-No pain      Patients Stated Pain Goal: 4 (75/64/33 2951)  Complications: No apparent anesthesia complications

## 2017-05-12 ENCOUNTER — Encounter (HOSPITAL_COMMUNITY): Payer: Self-pay | Admitting: Obstetrics & Gynecology

## 2017-05-12 DIAGNOSIS — N7011 Chronic salpingitis: Secondary | ICD-10-CM | POA: Diagnosis not present

## 2017-05-12 DIAGNOSIS — F419 Anxiety disorder, unspecified: Secondary | ICD-10-CM | POA: Diagnosis not present

## 2017-05-12 DIAGNOSIS — D649 Anemia, unspecified: Secondary | ICD-10-CM | POA: Diagnosis not present

## 2017-05-12 DIAGNOSIS — Z975 Presence of (intrauterine) contraceptive device: Secondary | ICD-10-CM | POA: Diagnosis not present

## 2017-05-12 DIAGNOSIS — N879 Dysplasia of cervix uteri, unspecified: Secondary | ICD-10-CM | POA: Diagnosis not present

## 2017-05-12 DIAGNOSIS — J45909 Unspecified asthma, uncomplicated: Secondary | ICD-10-CM | POA: Diagnosis not present

## 2017-05-12 DIAGNOSIS — D259 Leiomyoma of uterus, unspecified: Secondary | ICD-10-CM | POA: Diagnosis not present

## 2017-05-12 DIAGNOSIS — K660 Peritoneal adhesions (postprocedural) (postinfection): Secondary | ICD-10-CM | POA: Diagnosis not present

## 2017-05-12 DIAGNOSIS — N800 Endometriosis of uterus: Secondary | ICD-10-CM | POA: Diagnosis not present

## 2017-05-12 DIAGNOSIS — N920 Excessive and frequent menstruation with regular cycle: Secondary | ICD-10-CM | POA: Diagnosis not present

## 2017-05-12 DIAGNOSIS — N736 Female pelvic peritoneal adhesions (postinfective): Secondary | ICD-10-CM | POA: Diagnosis not present

## 2017-05-12 DIAGNOSIS — Q8500 Neurofibromatosis, unspecified: Secondary | ICD-10-CM | POA: Diagnosis not present

## 2017-05-12 MED ORDER — IBUPROFEN 800 MG PO TABS: 800.0000 mg | ORAL_TABLET | Freq: Three times a day (TID) | ORAL | 0 refills | Status: DC | PRN

## 2017-05-12 MED ORDER — OXYCODONE-ACETAMINOPHEN 5-325 MG PO TABS: 1.0000 | ORAL_TABLET | ORAL | 0 refills | Status: DC | PRN

## 2017-05-12 NOTE — Progress Notes (Signed)
Pt discharged with printed instructions. Pt verbalized an understanding. No concerns noted. Maclovia Uher L Shalev Helminiak, RN 

## 2017-05-12 NOTE — Anesthesia Postprocedure Evaluation (Signed)
Anesthesia Post Note  Patient: TEARA DUERKSEN  Procedure(s) Performed: Procedure(s) (LRB): HYSTERECTOMY TOTAL LAPAROSCOPIC WITH LEFT SALPINGECTOMY (Bilateral) CYSTOSCOPY (N/A) INTRAUTERINE DEVICE (IUD) REMOVAL (N/A) LAPAROSCOPIC LYSIS OF ADHESIONS     Patient location during evaluation: PACU Anesthesia Type: General Level of consciousness: awake and alert Pain management: pain level controlled Vital Signs Assessment: post-procedure vital signs reviewed and stable Respiratory status: spontaneous breathing, nonlabored ventilation and respiratory function stable Cardiovascular status: blood pressure returned to baseline and stable Postop Assessment: no signs of nausea or vomiting Anesthetic complications: no    Last Vitals:  Vitals:   05/11/17 2358 05/12/17 0334  BP: (!) 96/58 123/67  Pulse: 96 88  Resp: 18 18  Temp: 37 C 37.4 C  SpO2: 98% 100%    Last Pain:  Vitals:   05/12/17 0730  TempSrc:   PainSc: [place]

## 2017-05-12 NOTE — Anesthesia Postprocedure Evaluation (Signed)
Anesthesia Post Note  Patient: LARYAH NEUSER  Procedure(s) Performed: Procedure(s) (LRB): HYSTERECTOMY TOTAL LAPAROSCOPIC WITH LEFT SALPINGECTOMY (Bilateral) CYSTOSCOPY (N/A) INTRAUTERINE DEVICE (IUD) REMOVAL (N/A) LAPAROSCOPIC LYSIS OF ADHESIONS     Patient location during evaluation: [facility] Anesthesia Type: General Level of consciousness: awake and alert Pain management: pain level controlled Vital Signs Assessment: post-procedure vital signs reviewed and stable Respiratory status: spontaneous breathing, nonlabored ventilation and respiratory function stable Cardiovascular status: blood pressure returned to baseline and stable Postop Assessment: no signs of nausea or vomiting Anesthetic complications: no    Last Vitals:  Vitals:   05/12/17 0334 05/12/17 0829  BP: 123/67 (!) 107/52  Pulse: 88 92  Resp: 18 14  Temp: 37.4 C 37 C  SpO2: 100% 98%    Last Pain:  Vitals:   05/12/17 0829  TempSrc: Oral  PainSc:    Pain Goal: Patients Stated Pain Goal: 2 (05/12/17 0730)               Riki Sheer

## 2017-05-12 NOTE — Discharge Instructions (Signed)

## 2017-05-12 NOTE — Progress Notes (Signed)
1 Day Post-Op Procedure(s) (LRB): HYSTERECTOMY TOTAL LAPAROSCOPIC WITH LEFT SALPINGECTOMY (Bilateral) CYSTOSCOPY (N/A) INTRAUTERINE DEVICE (IUD) REMOVAL (N/A) LAPAROSCOPIC LYSIS OF ADHESIONS  Subjective: Patient reports excellent pain control.  Voiding find.  Appetite is decreased.  No vaginal delivery.  No nausea or emesis.  Minimal spotting.  Objective: I have reviewed patient's vital signs, intake and output, medications and labs.  General: alert and cooperative Resp: clear to auscultation bilaterally Cardio: regular rate and rhythm, S1, S2 normal, no murmur, click, rub or gallop GI: soft, non-tender; bowel sounds normal; no masses,  no organomegaly and incision: clean, dry and intact Extremities: extremities normal, atraumatic, no cyanosis or edema Vaginal Bleeding: none  Assessment: s/p Procedure(s) with comments: HYSTERECTOMY TOTAL LAPAROSCOPIC WITH LEFT SALPINGECTOMY (Bilateral) CYSTOSCOPY (N/A) INTRAUTERINE DEVICE (IUD) REMOVAL (N/A) LAPAROSCOPIC LYSIS OF ADHESIONS - with cautery of endometroisis: stable and progressing well  Plan: Discharge home  LOS: 0 days    [name] 05/12/2017, 7:20 AM

## 2017-05-12 NOTE — Addendum Note (Signed)
Addendum  created 05/12/17 0854 by Riki Sheer, CRNA   Sign clinical note

## 2017-05-17 NOTE — Progress Notes (Signed)
Post Operative Visit  Procedure: HYSTERECTOMY TOTAL LAPAROSCOPIC WITH LEFT SALPINGECTOMY,  CYSTOSCOPY, INTRAUTERINE DEVICE (IUD) REMOVAL, LAPAROSCOPIC LYSIS OF ADHESIONS    Days Post-op: 7 days  Subjective: Doing well.  Having fatigue. No vaginal bleeding.  No fevers.  Having "pasty" bowel movements.  Not loose.  No trouble voiding.  Pathology reviewed.  Questions reviewed.  Objective: BP 114/72 (BP Location: Right Arm, Patient Position: Sitting, Cuff Size: Normal)   Pulse 76   Resp 14   Ht 5\' 1"  (1.549 m)   Wt 198 lb 4 oz (89.9 kg)   LMP 05/07/2017   BMI 37.46 kg/m   EXAM General: alert, cooperative and no distress Resp: clear to auscultation bilaterally Cardio: regular rate and rhythm, S1, S2 normal, no murmur, click, rub or gallop GI: soft, non-tender; bowel sounds normal; no masses,  no organomegaly and incision: clean, dry and intact Extremities: extremities normal, atraumatic, no cyanosis or edema Vaginal Bleeding: none  Assessment: s/p TLH/left salpingectomy/cystoscopy due to fibroids, adenomyosis and endometriosis H/o chronic anemia  Plan: Recheck 3 weeks

## 2017-05-18 ENCOUNTER — Ambulatory Visit (INDEPENDENT_AMBULATORY_CARE_PROVIDER_SITE_OTHER): Payer: BLUE CROSS/BLUE SHIELD | Admitting: Obstetrics & Gynecology

## 2017-05-18 ENCOUNTER — Encounter: Payer: Self-pay | Admitting: Obstetrics & Gynecology

## 2017-05-18 VITALS — BP 114/72 | HR 76 | Resp 14 | Ht 61.0 in | Wt 198.2 lb

## 2017-05-18 DIAGNOSIS — Z9889 Other specified postprocedural states: Secondary | ICD-10-CM

## 2017-06-21 ENCOUNTER — Encounter: Payer: Self-pay | Admitting: Obstetrics & Gynecology

## 2017-06-22 ENCOUNTER — Encounter: Payer: Self-pay | Admitting: Obstetrics & Gynecology

## 2017-06-22 ENCOUNTER — Ambulatory Visit (INDEPENDENT_AMBULATORY_CARE_PROVIDER_SITE_OTHER): Payer: BLUE CROSS/BLUE SHIELD | Admitting: Obstetrics & Gynecology

## 2017-06-22 VITALS — BP 120/68 | HR 92 | Resp 14 | Wt 202.5 lb

## 2017-06-22 DIAGNOSIS — Z9889 Other specified postprocedural states: Secondary | ICD-10-CM

## 2017-06-22 NOTE — Progress Notes (Signed)
Post Operative Visit  Procedure:HYSTERECTOMY TOTAL LAPAROSCOPIC WITH LEFT SALPINGECTOMY (Bilateral ); CYSTOSCOPY (N/A ); INTRAUTERINE DEVICE (IUD) REMOVAL (N/A ); LAPAROSCOPIC LYSIS OF ADHESIONS   Days Post-op: 41 days   Subjective: Doing well.  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  Energy is good.  No vaginal bleeding.  Bowel and bladder function is normal.  Objective: BP 120/68 (BP Location: Right Arm, Patient Position: Sitting, Cuff Size: Large)   Pulse 92   Resp 14   Wt 202 lb 8 oz (91.9 kg)   LMP 05/07/2017   BMI 38.26 kg/m   EXAM General: alert and cooperative Resp: clear to auscultation bilaterally Cardio: regular rate and rhythm, S1, S2 normal, no murmur, click, rub or gallop GI: soft, non-tender; bowel sounds normal; no masses,  no organomegaly and incision: clean, dry and intact Extremities: extremities normal, atraumatic, no cyanosis or edema Vaginal Bleeding: none  Gyn:  NAEFG, vagina without lesions, cuff healing well without visible sutures or lesions, no erythema, no masses or fullness on BME  Assessment: s/p TLH/left salpingectomy, cystoscopy.  Doing well  Plan: Pt aware to not be SA for at least 8 full weeks post op and to limit heavy lifting/pulling/tugging until 8 weeks post op as well.   Return 1 year

## 2017-07-16 DIAGNOSIS — F322 Major depressive disorder, single episode, severe without psychotic features: Secondary | ICD-10-CM | POA: Diagnosis not present

## 2017-07-16 DIAGNOSIS — F411 Generalized anxiety disorder: Secondary | ICD-10-CM | POA: Diagnosis not present

## 2017-07-16 DIAGNOSIS — E669 Obesity, unspecified: Secondary | ICD-10-CM | POA: Diagnosis not present

## 2017-10-07 DIAGNOSIS — J329 Chronic sinusitis, unspecified: Secondary | ICD-10-CM | POA: Diagnosis not present

## 2017-11-30 ENCOUNTER — Ambulatory Visit: Payer: BLUE CROSS/BLUE SHIELD | Admitting: Nurse Practitioner

## 2017-12-06 ENCOUNTER — Ambulatory Visit: Payer: BLUE CROSS/BLUE SHIELD | Admitting: Obstetrics & Gynecology

## 2018-02-01 ENCOUNTER — Other Ambulatory Visit: Payer: Self-pay | Admitting: Obstetrics & Gynecology

## 2018-02-01 DIAGNOSIS — Z1231 Encounter for screening mammogram for malignant neoplasm of breast: Secondary | ICD-10-CM

## 2018-02-07 ENCOUNTER — Telehealth: Payer: Self-pay | Admitting: Neurology

## 2018-02-07 ENCOUNTER — Encounter: Payer: Self-pay | Admitting: Neurology

## 2018-02-07 ENCOUNTER — Ambulatory Visit: Payer: BLUE CROSS/BLUE SHIELD | Admitting: Neurology

## 2018-02-07 VITALS — BP 117/77 | HR 88 | Ht 61.0 in | Wt 214.8 lb

## 2018-02-07 DIAGNOSIS — Q8501 Neurofibromatosis, type 1: Secondary | ICD-10-CM | POA: Diagnosis not present

## 2018-02-07 DIAGNOSIS — M4802 Spinal stenosis, cervical region: Secondary | ICD-10-CM | POA: Insufficient documentation

## 2018-02-07 NOTE — Patient Instructions (Signed)
Spinal Stenosis Spinal stenosis occurs when the open space (spinal canal) between the bones of your spine (vertebrae) narrows, putting pressure on the spinal cord or nerves. What are the causes? This condition is caused by areas of bone pushing into the central canals of your vertebrae. This condition may be present at birth (congenital), or it may be caused by:  Arthritic deterioration of your vertebrae (spinal degeneration). This usually starts around age 25.  Injury or trauma to the spine.  Tumors in the spine.  Calcium deposits in the spine.  What are the signs or symptoms? Symptoms of this condition include:  Pain in the neck or back that is generally worse with activities, particularly when standing and walking.  Numbness, tingling, hot or cold sensations, weakness, or weariness in your legs.  Pain going up and down the leg (sciatica).  Frequent episodes of falling.  A foot-slapping gait that leads to muscle weakness.  In more serious cases, you may develop:  Problemspassing stool or passing urine.  Difficulty having sex.  Loss of feeling in part or all of your leg.  Symptoms may come on slowly and get worse over time. How is this diagnosed? This condition is diagnosed based on your medical history and a physical exam. Tests will also be done, such as:  MRI.  CT scan.  X-ray.  How is this treated? Treatment for this condition often focuses on managing your pain and any other symptoms. Treatment may include:  Practicing good posture to lessen pressure on your nerves.  Exercising to strengthen muscles, build endurance, improve balance, and maintain good joint movement (range of motion).  Losing weight, if needed.  Taking medicines to reduce swelling, inflammation, or pain.  Assistive devices, such as a corset or brace.  In some cases, surgery may be needed. The most common procedure is decompression laminectomy. This is done to remove excess bone that  puts pressure on your nerve roots. Follow these instructions at home: Managing pain, stiffness, and swelling  Do all exercises and stretches as told by your health care provider.  Practice good posture. If you were given a brace or a corset, wear it as told by your health care provider.  Do not do any activities that cause pain. Ask your health care provider what activities are safe for you.  Do not lift anything that is heavier than 10 lb (4.5 kg) or the limit that your health care provider tells you.  Maintain a healthy weight. Talk with your health care provider if you need help losing weight.  If directed, apply heat to the affected area as often as told by your health care provider. Use the heat source that your health care provider recommends, such as a moist heat pack or a heating pad. ? Place a towel between your skin and the heat source. ? Leave the heat on for 20-30 minutes. ? Remove the heat if your skin turns bright red. This is especially important if you are not able to feel pain, heat, or cold. You may have a greater risk of getting burned. General instructions  Take over-the-counter and prescription medicines only as told by your health care provider.  Do not use any products that contain nicotine or tobacco, such as cigarettes and e-cigarettes. If you need help quitting, ask your health care provider.  Eat a healthy diet. This includes plenty of fruits and vegetables, whole grains, and low-fat (lean) protein.  Keep all follow-up visits as told by your health care provider.  This is important. Contact a health care provider if:  Your symptoms do not get better or they get worse.  You have a fever. Get help right away if:  You have new or worse pain in your neck or upper back.  You have severe pain that cannot be controlled with medicines.  You are dizzy.  You have vision problems, blurred vision, or double vision.  You have a severe headache that is worse when  you stand.  You have nausea or you vomit.  You develop new or worse numbness or tingling in your back or legs.  You have pain, redness, swelling, or warmth in your arm or leg. Summary  Spinal stenosis occurs when the open space (spinal canal) between the bones of your spine (vertebrae) narrows. This narrowing puts pressure on the spinal cord or nerves.  Spinal stenosis can cause numbness, weakness, or pain in the neck, back, and legs.  This condition may be caused by a birth defect, arthritic deterioration of your vertebrae, injury, tumors, or calcium deposits.  This condition is usually diagnosed with MRIs, CT scans, and X-rays. This information is not intended to replace advice given to you by your health care provider. Make sure you discuss any questions you have with your health care provider. Document Released: 12/05/2003 Document Revised: 08/19/2016 Document Reviewed: 08/19/2016 Elsevier Interactive Patient Education  2018 Elsevier Inc.  

## 2018-02-07 NOTE — Telephone Encounter (Signed)
Patient states she will call us back to schedule 1 yr f/u.

## 2018-02-07 NOTE — Progress Notes (Signed)
[facility]    Provider:  Dr Jaynee Eagles Referring Provider: Alroy Dust, [name], MD Primary Care Physician:  Alroy Dust, Carlean Jews.Marlou Sa, MD  CC:  Neurofibromatosis.  Interval history: Doing well, no new symptoms, no vision changes, no new lesions, no headaches or seizures. Nothing is bothering her. She saw ophthalmology and going to follow up. Reviewed MRI images, discussed cervical stenosis, risks, she declines repeat MRI will image next year unless symptomatic.   Addendum 03/17/17: NF type 1. Good ou, full VF ou, essentially normal complete exam, f/u one year  HPI:  Lacey Lee is a 42 y.o. female here as a referral from Dr. Alroy Dust for neurofibromatosis. Past medical history of depression, generalized anxiety disorder, iron deficiency anemia and vitamin D deficiency. Patient's father with neurofibromatosis. Since last being seen 6 years ago by Dr. Erling Cruz who is a retired Garment/textile technologist. Saw a Paediatric nurse in 2013. Last imaging in 2012 and no follow up since then. She has some new skin lesions. She has noticed vision changes. She has chronic back pain more in the lower back and radiating into the hip. No GI issues. She gets regular obgyn screening. Discussed specialty clinics. No abdominal pain. No sensory paresthesias. No cognitive deficits. Never been to a specialty clinic. No weakness, no seizures, she has had some dizziness and headaches when she stands up worse better if she sits down. The headache is pressure in the face has been worsening. No cognitive problems, no falls, no weakness, no other pain. She has chronic constipation and anemia.   Reviewed notes, labs and imaging from outside physicians, which showed:  Reviewed MRI of the brain from 1993 report: FINDINGS:  [place].  THIS DEMONSTRATES MARKED CONTRAST ENHANCEMENT.  IN A PATIENT WITH NEUROFIBROMATOSIS 1, THIS COULD REPRESENT A SMALL PILOCYTIC  ASTROCYTOMA AND IT HAS NOT CHANGED IN SIZE.  IN ADDITION, THERE IS NO SIGNIFICANT CHANGE IN A SMALL LESION IN THE SPLENIUM OF THE CORPUS CALLOSUM THAT DEMONSTRATES MINIMAL CONTRAST ENHANCEMENT.  THIS IS LIKELY BENIGN AND MAY REPRESENT A HAMARTOMA OR AN AREA OF GLIOSIS.  THERE ARE NO NEW ENHANCING LESIONS.  NO SIGNIFICANT MASS EFFECT AND NO MIDLINE SHIFT.  NEGATIVE FOR EXTRA-AXIAL FLUID COLLECTIONS. THE VENTRICULAR SYSTEM AND SULCI ARE OF NORMAL SIZE. CONCLUSIONS: 1.  NO SIGNIFICANT CHANGE IN SMALL ENHANCING NODULAR LESION PERIATRIAL REGION ON THE LEFT.  DIFFERENTIAL INCLUDES PILOCYTIC ASTROCYTOMA, HAMARTOMA.  2.  SMALL, MILDLY ENHANCING LESION IN THE SPLENIUM OF THE CORPUS CALLOSUM ALSO UNCHANGED AND LIKELY REPRESENTS BENIGN LESION SUCH AS HAMARTOMA. APPROVING MD:  Virgel Bouquet  MRI spine 1992: Reviewed report    COMPARE 08-03-89.  NO COMPARISON IS AVAILABLE FOR THE THORACIC AND CERVICAL SPINE.   Marland Kitchen   FINDINGS: NO SIGNIFICANT CHANGE IN THE CYSTIC STRUCTURE IN THE SPINAL CANAL AT THE LEVEL OF S-3.  THIS LESION FOLLOWS C.S.F. ON ALL PULSE SEQUENCES, AND DOES NOT ENHANCE.  THIS IS FELT TO BE MOST CONSISTENT WITH SACRAL CYSTS.  NEUROFIBROMA WOULD BE EXPECTED TO ENHANCE, AT LEAST IN THE PERIPHERY.   NEGATIVE FOR EVIDENCE OF OTHER SPINAL ABNORMALITY. NEURAL FORAMINA ARE NORMAL IN SIZE AND NO FORAMINAL MASSES ARE IDENTIFIED.  THERE IS NO EVIDENCE OF CORD OR NERVE ROOT COMPRESSION.  INCIDENTAL NOTE IS MADE OF BILATERL PROMINENT OVARIES WITH MULTIPLE CYSTS, CONSISTENT WITH FOLLICLES.  THIS IS NOT EVIDENT ON PRIOR EXAM, ALTHOUGH IMAGING DID NOT EXTEND FULLY INTO THE PELVIS ON PRIOR STUDY. CONCLUSIONS:   1.  NO SIGNIFICANT  CHANGE IN  SACRAL CYST.   2.  BILATERAL PROMINENT OVARIES, WHICH ARE NOW SOMEWHAT CYSTIC IN APPEARANCE, CONSISTENT WITH MULTIPLE FOLLICLES.   3.  NEGATIVE FOR OTHER SPINAL LESION. APPROVING MD:  Iva Lento  MRI pelvis 07/1989  FINDINGS:  SEVERAL  WELL-CIRCUMSCRIBED AREA OF HYPOINTENSITY ON T1 WEIGHTED IMAGES AND INCREASED SIGNAL INTENSITY ON T2 WEIGHTED IMAGES ACCOMPANY THE NEUROVASCULAR BUNDLE INFERIORLY WITHIN THE TRUE PELVIS. THEY ARE SYMMETRIC IN CHARACTER.  WHILE THESE MOST LIKELY REPRESENT LYMPH NODES, SMALL SCHWANNOMAS OR NEUROFIBROMAS CANNOT BE EXCLUDED.  NO LARGE PLEXIFORM LESIONS ARE SEEN WITHIN THE PELVIS.  AT APPROX. S-3 LEVEL, WITHIN THE CANAL, THERE IS AN ENLARGED, WELL-CIRCUMSCRIBED AREA, HYPOINTENSE ON T1 WEIGHTED IMAGES, AND HYPERINTENSE ON T2 WEIGHTED IMAGES FOLLOWING THE SIGNAL OF THE C.S.F.   HYPERINTENSITY IS SEEN WITHIN A SLIGHTLY ENLARGED ENDOMETRIAL CAVITY, PRESUMABLY REPRESENTING PROLIFERATIVE OR SLOUGHED ENDOMETRIUM.  BOTH OVARIES ARE SEEN, AS WELL, WITH THE LEFT OVARY DEMONSTRATING A HYPOINTENSE LESION ON ALL SEQUENCES MEDIALLY, LIKELY REPRESENTING A SMALL REGION OF CALCIFICATION OR FIBROSIS/SCARRING. CONCLUSIONS:   1.  PROBABLE SMALL LYMPH NODES ACCOMPANYING NEUROVASCULAR BUNDLE IN THE TRUE PELVIS.  SMALL NEUROFIBROMAS CANNOT BE TOTALLY EXCLUDED.   2.  NO LARGE PLEXIFORM OR OTHER MASSES WITHIN THE PELVIS.   3.  WELL-DEFINED REGION FOLLOWING C.S.F. IN INTENSITY ON SEQUENCES INVOLVING THE S-3 REGION WITHIN THE SACRAL SPINAL CANAL.  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 A CENTRAL MENINGOCELE OR ARACHNOID CYST CANNOT BE EXCLUDED. RECOMMEND CORRELATION WITH L-S SPINE MR, PERFORMED ON 08-03-89.  I reviewed previous neurology notes. She was first seen by Dr. love in 2003 for evaluation of neurofibromatosis. She has a positive family history in her father, paternal grandmother, 3 paternal uncles and a paternal aunt. Per notes, previous MRIs of the brain cervical spine thoracic and lumbar spine with and without contrast were normal last evaluation was January 2011 with previous imaging April 2003, September 2004, August 2005 and September 2 6. She was  describing headaches at that time. No visual disturbances.  Review of Systems: Patient complains of symptoms per HPI as well as the following symptoms: Anemia, easy bruising, feeling cold, joint pain, allergies, dizziness, moles. Pertinent negatives per HPI. All others negative.   Social History   Socioeconomic History  . Marital status: Single    Spouse name: Not on file  . Number of children: 0  . Years of education: Not on file  . Highest education level: Not on file  Occupational History  . Occupation: Engineer, mining at [employer]: [employer]  . Financial resource strain: Not on file  . Food insecurity:    Worry: Not on file    Inability: Not on file  . Transportation needs:    Medical: Not on file    Non-medical: Not on file  Tobacco Use  . Smoking status: Never Smoker  . Smokeless tobacco: Never Used  Substance and Sexual Activity  . Alcohol use: Yes    Alcohol/week: 0.0 oz    Comment: OCCASIONAL  . Drug use: No  . Sexual activity: Yes    Partners: Male    Birth control/protection: Surgical    Comment: [place] 05/11/17   Lifestyle  . Physical activity:    Days per week: Not on file    Minutes per session: Not on file  . Stress: Not on file  Relationships  . Social connections:    Talks on phone: Not on file  Gets together: Not on file    Attends religious service: Not on file    Active member of club or organization: Not on file    Attends meetings of clubs or organizations: Not on file    Relationship status: Not on file  . Intimate partner violence:    Fear of current or ex partner: Not on file    Emotionally abused: Not on file    Physically abused: Not on file    Forced sexual activity: Not on file  Other Topics Concern  . Not on file  Social History Narrative   Lives at home with her sister   Right handed   Caffeine: 6 cups daily    Family History  Adopted: Yes  Problem Relation Age of Onset  . Allergies Mother         food  . Hypertension Mother   . Hyperlipidemia Mother   . Heart failure Mother   . Thyroid disease Mother   . Diabetes Mother   . Lung cancer Father   . Diabetes Sister   . Hypertension Sister   . Renal Disease Sister        dialysis  . Uterine cancer Paternal Grandmother   . Breast cancer Neg Hx     Past Medical History:  Diagnosis Date  . Anxiety    slight  . Asthma    albuterol rescue inhaler-uses prn, exacerbated by pet dander  . History of ectopic pregnancy 1999   S/P RIGHT SALPINGECTOMY  . History of herpes simplex type 2 infection 2005  . History of uterine fibroid   . Iron deficiency anemia   . Menorrhagia   . Neurofibromatosis [facility])    dx is child--  nonmalignant  . Pneumonia    years ago  . Seasonal allergies   . Uterine fibroid     Past Surgical History:  Procedure Laterality Date  . CYSTOSCOPY N/A 05/11/2017   Procedure: CYSTOSCOPY;  Surgeon: Megan Salon, MD;  Location: [facility];  Service: Gynecology;  Laterality: N/A;  . DILATATION & CURETTAGE/HYSTEROSCOPY WITH MYOSURE N/A 08/07/2016   Procedure: HYSTEROSCOPY WITH MYOSURE;  Surgeon: Megan Salon, MD;  Location: [facility];  Service: Gynecology;  Laterality: N/A;  Fibroid resection  . IUD REMOVAL N/A 08/07/2016   Procedure: INTRAUTERINE DEVICE (IUD) REMOVAL;  Surgeon: Megan Salon, MD;  Location: [facility];  Service: Gynecology;  Laterality: N/A;  . IUD REMOVAL N/A 05/11/2017   Procedure: INTRAUTERINE DEVICE (IUD) REMOVAL;  Surgeon: Megan Salon, MD;  Location: [facility];  Service: Gynecology;  Laterality: N/A;  . LAPAROSCOPIC LYSIS OF ADHESIONS  05/11/2017   Procedure: LAPAROSCOPIC LYSIS OF ADHESIONS;  Surgeon: Megan Salon, MD;  Location: [facility];  Service: Gynecology;;  with cautery of endometroisis  . LAPAROSCOPY FOR ECTOPIC PREGNANCY  2000   RIGHT SALPINGECTOMY  . ROBOT ASSISTED MYOMECTOMY N/A 12/16/2012   Procedure: ROBOTIC ASSISTED MYOMECTOMY;  Surgeon: Governor Specking, MD;  Location: [facility];  Service: Gynecology;  Laterality: N/A;  . ROBOTIC ASSISTED LAPAROSCOPIC LYSIS OF ADHESION  12/16/2012   Procedure: ROBOTIC ASSISTED LAPAROSCOPIC LYSIS OF ADHESION;  Surgeon: Governor Specking, MD;  Location: [facility];  Service: Gynecology;;  with excision of perotoneal lesions  . TOTAL LAPAROSCOPIC HYSTERECTOMY WITH SALPINGECTOMY Bilateral 05/11/2017   Procedure: HYSTERECTOMY TOTAL LAPAROSCOPIC WITH LEFT SALPINGECTOMY;  Surgeon: Megan Salon, MD;  Location: [facility];  Service: Gynecology;  Laterality: Bilateral;  . WISDOM TOOTH EXTRACTION  Current Outpatient Medications  Medication Sig Dispense Refill  . albuterol (PROVENTIL HFA;VENTOLIN HFA) 108 (90 BASE) MCG/ACT inhaler Inhale 2 puffs into the lungs every 6 (six) hours as needed for wheezing or shortness of breath.     . ALPRAZolam (XANAX) 0.25 MG tablet Take 0.25 mg by mouth daily as needed for anxiety.     . cetirizine (ZYRTEC) 10 MG tablet Take 10 mg by mouth daily.    . Ferrous Fumarate (HEMOCYTE - 106 MG FE) 324 (106 Fe) MG TABS tablet Take 1 tablet by mouth daily.    Marland Kitchen ibuprofen (ADVIL,MOTRIN) 800 MG tablet Take 1 tablet (800 mg total) by mouth every 8 (eight) hours as needed. (Patient taking differently: Take 800 mg by mouth every 8 (eight) hours as needed for moderate pain. ) 30 tablet 1  . Vitamin D, Ergocalciferol, (DRISDOL) 50000 units CAPS capsule Take 1 capsule (50,000 Units total) by mouth every 7 (seven) days. 30 capsule 3  . Calcium Carb-Cholecalciferol (CALCIUM 600-D PO) Take 2 tablets by mouth daily.     Marland Kitchen EPIPEN 2-PAK 0.3 MG/0.3ML DEVI 0.3 mg as needed (environmental allergies).     . Omega-3 Fatty Acids (FISH OIL) 1200 MG CAPS Take 1 capsule by mouth 4 (four) times a week.      No current facility-administered medications for this visit.     Allergies as of 02/07/2018 - Review Complete 02/07/2018  Allergen Reaction Noted  . Latex Other (See Comments) 01/10/2013  . Other Hives and  Swelling 11/13/2014  . Tramadol Nausea And Vomiting 02/18/2016    Vitals: BP 117/77 (BP Location: Right Arm, Patient Position: Sitting)   Pulse 88   Ht 5\' 1"  (1.549 m)   Wt 214 lb 12.8 oz (97.4 kg)   LMP 05/07/2017   BMI 40.59 kg/m  Last Weight:  Wt Readings from Last 1 Encounters:  02/07/18 214 lb 12.8 oz (97.4 kg)   Last Height:   Ht Readings from Last 1 Encounters:  02/07/18 5\' 1"  (1.549 m)   Physical exam: Exam: Gen: NAD, conversant, well nourised, obese, well groomed                     CV: RRR, no MRG. No Carotid Bruits. No peripheral edema, warm, nontender Eyes: Conjunctivae clear without exudates or hemorrhage Skin: cafe au lait spots and freckling.   Neuro: Detailed Neurologic Exam  Speech:    Speech is normal; fluent and spontaneous with normal comprehension.  Cognition:    The patient is oriented to person, place, and time;     recent and remote memory intact;     language fluent;     normal attention, concentration,     fund of knowledge Cranial Nerves:    The pupils are equal, round, and reactive to light. The fundi are normal and spontaneous venous pulsations are present. Visual fields are full to finger confrontation. Extraocular movements are intact. Trigeminal sensation is intact and the muscles of mastication are normal. The face is symmetric. The palate elevates in the midline. Hearing intact. Voice is normal. Shoulder shrug is normal. The tongue has normal motion without fasciculations.   Coordination:    Normal finger to nose and heel to shin. Normal rapid alternating movements.   Gait:    Heel-toe and tandem gait are normal.   Motor Observation:    No asymmetry, no atrophy, and no involuntary movements noted. Tone:    Normal muscle tone.    Posture:    Posture  is normal. normal erect    Strength:    Strength is V/V in the upper and lower limbs.      Sensation: intact to LT     Reflex Exam:  DTR's: decreased left patellar reflex  otherwise deep tendon reflexes in the upper and lower extremities are normal bilaterally.   Toes:    The toes are downgoing bilaterally.   Clonus:    Clonus is absent.       Assessment/Plan:  42 year old with NF1 lost to follow up. Previous MRIs have reportedly been unremarkable. Has not had MRIs completed since 2014. MRI brain was stable from 2008, no shwanomas were seen in the cervical, thoracic or lumbar. Will consider repeating next year.   A formal ophthalmologic examination, including visual screening: completed, follow yearly MRI brain, cervical spine, thoracic spine, lumbar spine screening for malignant peripheral nerve sheath tumors and malignant brain lesions ENT referral chronic sinus pressure and hearing evaluation in patient with NF1 - She saw Dr. Ernesto Rutherford and follows with him, encouraged her to follow up [facility] specialty neurofibromatosis multi-disciplinary clinic- she did not go, she declines followup with me in one year Dermatology - she followed up, continue to follow Imaging of the neuroaxis was unremarkable, moderate cervical stenosis but no symptoms or myelopathic signs.   MRI brain 02/2017: This MRI of the brain with and without contrast shows one small focus adjacent to the occipital horn of the left lateral ventricle that does not enhance unchanged when compared to the 06/15/2007 MRI.Marland Kitchen This likely represents a small region of gliosis but appeared to be more supependymal located on the 2004 MRI.   There are no acute findings.  Cervical spine: No schwannomas. At C3-C4, there is a small central disc herniation causing mild to moderate spinal stenosis, significantly progressed compared to the 2006 MRI when this was just a small disc protrusion. There is no nerve root compression. Reviewed with patient, discussed if worsening could impact the spinal cord which can be dangerous that high so if any symptoms, reimage next year or sooner if needed.   Thoracic spine:  A small right  T10 perineural cyst is noted of doubtful significance.   No schwannomas are noted. 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  MRI Lumbar spine: A few small subcutaneous enhancing nodules are noted consistent with cutaneous neurofibromas.     Sarina Ill, MD  [facility] [address] [place], [place] [address]  Phone [phone] Fax [phone]  A total of 25 minutes was spent face-to-face with this patient. Over half this time was spent on counseling patient on the NF1, cervical stebosis diagnosis and different diagnostic and therapeutic options or risk factor reduction and education.

## 2018-03-02 ENCOUNTER — Other Ambulatory Visit: Payer: Self-pay | Admitting: Family Medicine

## 2018-03-02 DIAGNOSIS — R109 Unspecified abdominal pain: Secondary | ICD-10-CM | POA: Diagnosis not present

## 2018-03-02 DIAGNOSIS — R112 Nausea with vomiting, unspecified: Secondary | ICD-10-CM | POA: Diagnosis not present

## 2018-03-02 DIAGNOSIS — R1110 Vomiting, unspecified: Secondary | ICD-10-CM

## 2018-03-08 ENCOUNTER — Ambulatory Visit
Admission: RE | Admit: 2018-03-08 | Discharge: 2018-03-08 | Disposition: A | Discharge status: Home / Self Care | Payer: BLUE CROSS/BLUE SHIELD | Source: Ambulatory Visit | Attending: Family Medicine | Admitting: Family Medicine

## 2018-03-08 DIAGNOSIS — R109 Unspecified abdominal pain: Secondary | ICD-10-CM

## 2018-03-08 DIAGNOSIS — R1110 Vomiting, unspecified: Secondary | ICD-10-CM

## 2018-03-24 ENCOUNTER — Ambulatory Visit
Admission: RE | Admit: 2018-03-24 | Discharge: 2018-03-24 | Disposition: A | Discharge status: Home / Self Care | Payer: BLUE CROSS/BLUE SHIELD | Source: Ambulatory Visit | Attending: Obstetrics & Gynecology | Admitting: Obstetrics & Gynecology

## 2018-03-24 DIAGNOSIS — Z1231 Encounter for screening mammogram for malignant neoplasm of breast: Secondary | ICD-10-CM

## 2018-03-25 ENCOUNTER — Other Ambulatory Visit: Payer: Self-pay | Admitting: Obstetrics & Gynecology

## 2018-03-25 DIAGNOSIS — R928 Other abnormal and inconclusive findings on diagnostic imaging of breast: Secondary | ICD-10-CM

## 2018-03-29 ENCOUNTER — Ambulatory Visit
Admission: RE | Admit: 2018-03-29 | Discharge: 2018-03-29 | Disposition: A | Discharge status: Home / Self Care | Payer: BLUE CROSS/BLUE SHIELD | Source: Ambulatory Visit | Attending: Obstetrics & Gynecology | Admitting: Obstetrics & Gynecology

## 2018-03-29 DIAGNOSIS — R928 Other abnormal and inconclusive findings on diagnostic imaging of breast: Secondary | ICD-10-CM

## 2018-03-29 DIAGNOSIS — N6011 Diffuse cystic mastopathy of right breast: Secondary | ICD-10-CM | POA: Diagnosis not present

## 2018-03-29 DIAGNOSIS — R922 Inconclusive mammogram: Secondary | ICD-10-CM | POA: Diagnosis not present

## 2018-04-25 ENCOUNTER — Encounter (HOSPITAL_BASED_OUTPATIENT_CLINIC_OR_DEPARTMENT_OTHER): Payer: Self-pay | Admitting: *Deleted

## 2018-04-25 ENCOUNTER — Emergency Department (HOSPITAL_BASED_OUTPATIENT_CLINIC_OR_DEPARTMENT_OTHER): Payer: BLUE CROSS/BLUE SHIELD

## 2018-04-25 ENCOUNTER — Other Ambulatory Visit: Payer: Self-pay

## 2018-04-25 ENCOUNTER — Emergency Department (HOSPITAL_BASED_OUTPATIENT_CLINIC_OR_DEPARTMENT_OTHER)
Admission: EM | Admit: 2018-04-25 | Discharge: 2018-04-25 | Disposition: A | Discharge status: Home / Self Care | Payer: BLUE CROSS/BLUE SHIELD | Attending: Emergency Medicine | Admitting: Emergency Medicine

## 2018-04-25 DIAGNOSIS — R05 Cough: Secondary | ICD-10-CM | POA: Diagnosis not present

## 2018-04-25 DIAGNOSIS — J302 Other seasonal allergic rhinitis: Secondary | ICD-10-CM | POA: Diagnosis not present

## 2018-04-25 DIAGNOSIS — J309 Allergic rhinitis, unspecified: Secondary | ICD-10-CM | POA: Diagnosis not present

## 2018-04-25 DIAGNOSIS — J4541 Moderate persistent asthma with (acute) exacerbation: Secondary | ICD-10-CM | POA: Insufficient documentation

## 2018-04-25 DIAGNOSIS — R0602 Shortness of breath: Secondary | ICD-10-CM | POA: Diagnosis not present

## 2018-04-25 DIAGNOSIS — Z79899 Other long term (current) drug therapy: Secondary | ICD-10-CM | POA: Diagnosis not present

## 2018-04-25 MED ORDER — FLUTICASONE PROPIONATE 50 MCG/ACT NA SUSP: 1.0000 | Freq: Every day | NASAL | 2 refills | Status: DC

## 2018-04-25 MED ORDER — PREDNISONE 20 MG PO TABS: 60.0000 mg | ORAL_TABLET | Freq: Every day | ORAL | 0 refills | Status: DC

## 2018-04-25 NOTE — Discharge Instructions (Addendum)
Your evaluated in the emergency department for increased nasal congestion and chest tightness/shortness of breath since exposure to chemicals.  Your chest x-ray did not show an obvious pneumonia and your oxygen was at 100%.  We are prescribing you some steroids to take for the next 5 days.  We are also prescribing a nasal steroid to help with your nasal congestion through the times of your seasonal allergies act up.  Please follow-up with your regular doctor.

## 2018-04-25 NOTE — ED Notes (Signed)
Patient transported to X-ray 

## 2018-04-25 NOTE — ED Notes (Signed)
ED Provider at bedside. 

## 2018-04-25 NOTE — ED Triage Notes (Signed)
Sob since cleaning the house with chemicals yesterday. Cough. Seasonal allergies.

## 2018-04-25 NOTE — ED Provider Notes (Signed)
[facility] EMERGENCY DEPARTMENT Provider Note   CSN: [id number] Arrival date & time: 04/25/18  1700     History   Chief Complaint Chief Complaint  Patient presents with  . Shortness of Breath    HPI Lacey Lee is a 42 y.o. female.  She has a history of asthma and seasonal allergies.  She states she has been sneezing more and coughing more over a week and worse since yesterday when she was using household chemicals.  She is had a cough productive of white sputum.  She feels cold at times but has not had a fever.  She does not smoke.  She is using Claritin and Zantac without any significant relief.  The history is provided by the patient.  Shortness of Breath  This is a recurrent problem. The problem occurs intermittently.The problem has not changed since onset.Associated symptoms include rhinorrhea, sore throat, cough, sputum production and wheezing. Pertinent negatives include no fever, no coryza, no ear pain, no neck pain, no hemoptysis, no chest pain, no syncope, no vomiting, no abdominal pain, no rash, no leg pain and no leg swelling. The problem's precipitants include pollens and fumes. Associated medical issues include asthma.    Past Medical History:  Diagnosis Date  . Anxiety    slight  . Asthma    albuterol rescue inhaler-uses prn, exacerbated by pet dander  . History of ectopic pregnancy 1999   S/P RIGHT SALPINGECTOMY  . History of herpes simplex type 2 infection 2005  . History of uterine fibroid   . Iron deficiency anemia   . Menorrhagia   . Neurofibromatosis [facility])    dx is child--  nonmalignant  . Pneumonia    years ago  . Seasonal allergies   . Uterine fibroid     Patient Active Problem List   Diagnosis Date Noted  . Cervical stenosis of spinal canal 02/07/2018  . Neurofibromatosis ([place]) 02/04/2017  . Greater trochanteric bursitis of left hip 08/31/2016  . Anemia due to chronic blood loss 08/27/2016  . Endometriosis 01/06/2013  .  Seasonal and perennial allergic rhinitis 05/29/2012  . Asthma, mild intermittent 05/29/2012    Past Surgical History:  Procedure Laterality Date  . CYSTOSCOPY N/A 05/11/2017   Procedure: CYSTOSCOPY;  Surgeon: Megan Salon, MD;  Location: [facility];  Service: Gynecology;  Laterality: N/A;  . DILATATION & CURETTAGE/HYSTEROSCOPY WITH MYOSURE N/A 08/07/2016   Procedure: HYSTEROSCOPY WITH MYOSURE;  Surgeon: Megan Salon, MD;  Location: [facility];  Service: Gynecology;  Laterality: N/A;  Fibroid resection  . IUD REMOVAL N/A 08/07/2016   Procedure: INTRAUTERINE DEVICE (IUD) REMOVAL;  Surgeon: Megan Salon, MD;  Location: [facility];  Service: Gynecology;  Laterality: N/A;  . IUD REMOVAL N/A 05/11/2017   Procedure: INTRAUTERINE DEVICE (IUD) REMOVAL;  Surgeon: Megan Salon, MD;  Location: [facility];  Service: Gynecology;  Laterality: N/A;  . LAPAROSCOPIC LYSIS OF ADHESIONS  05/11/2017   Procedure: LAPAROSCOPIC LYSIS OF ADHESIONS;  Surgeon: Megan Salon, MD;  Location: [facility];  Service: Gynecology;;  with cautery of endometroisis  . LAPAROSCOPY FOR ECTOPIC PREGNANCY  2000   RIGHT SALPINGECTOMY  . ROBOT ASSISTED MYOMECTOMY N/A 12/16/2012   Procedure: ROBOTIC ASSISTED MYOMECTOMY;  Surgeon: Governor Specking, MD;  Location: [facility];  Service: Gynecology;  Laterality: N/A;  . ROBOTIC ASSISTED LAPAROSCOPIC LYSIS OF ADHESION  12/16/2012   Procedure: ROBOTIC ASSISTED LAPAROSCOPIC LYSIS OF ADHESION;  Surgeon: Governor Specking, MD;  Location: [facility];  Service: Gynecology;;  with excision of perotoneal lesions  . TOTAL LAPAROSCOPIC HYSTERECTOMY WITH SALPINGECTOMY Bilateral 05/11/2017   Procedure: HYSTERECTOMY TOTAL LAPAROSCOPIC WITH LEFT SALPINGECTOMY;  Surgeon: Megan Salon, MD;  Location: [facility];  Service: Gynecology;  Laterality: Bilateral;  . WISDOM TOOTH EXTRACTION       OB History    Gravida  1   Para  0   Term  0   Preterm  0   AB  1   Living  0     SAB  0    TAB  0   Ectopic  1   Multiple  0   Live Births  0            Home Medications    Prior to Admission medications   Medication Sig Start Date End Date Taking? Authorizing Provider  albuterol (PROVENTIL HFA;VENTOLIN HFA) 108 (90 BASE) MCG/ACT inhaler Inhale 2 puffs into the lungs every 6 (six) hours as needed for wheezing or shortness of breath.     [provider]  ALPRAZolam Duanne Moron) 0.25 MG tablet Take 0.25 mg by mouth daily as needed for anxiety.  04/30/16   [provider]  Calcium Carb-Cholecalciferol (CALCIUM 600-D PO) Take 2 tablets by mouth daily.     [provider]  cetirizine (ZYRTEC) 10 MG tablet Take 10 mg by mouth daily.    [provider]  EPIPEN 2-PAK 0.3 MG/0.3ML DEVI 0.3 mg as needed (environmental allergies).  05/19/12   [provider]  Ferrous Fumarate (HEMOCYTE - 106 MG FE) 324 (106 Fe) MG TABS tablet Take 1 tablet by mouth daily.    [provider]  ibuprofen (ADVIL,MOTRIN) 800 MG tablet Take 1 tablet (800 mg total) by mouth every 8 (eight) hours as needed. Patient taking differently: Take 800 mg by mouth every 8 (eight) hours as needed for moderate pain.  01/09/17   Megan Salon, MD  Omega-3 Fatty Acids (FISH OIL) 1200 MG CAPS Take 1 capsule by mouth 4 (four) times a week.     [provider]  Vitamin D, Ergocalciferol, (DRISDOL) 50000 units CAPS capsule Take 1 capsule (50,000 Units total) by mouth every 7 (seven) days. 11/24/16   Kem Boroughs, FNP    Family History Family History  Adopted: Yes  Problem Relation Age of Onset  . Allergies Mother        food  . Hypertension Mother   . Hyperlipidemia Mother   . Heart failure Mother   . Thyroid disease Mother   . Diabetes Mother   . Lung cancer Father   . Diabetes Sister   . Hypertension Sister   . Renal Disease Sister        dialysis  . Uterine cancer Paternal Grandmother   . Breast cancer Neg Hx     Social History Social History    Tobacco Use  . Smoking status: Never Smoker  . Smokeless tobacco: Never Used  Substance Use Topics  . Alcohol use: Yes    Alcohol/week: 0.0 oz    Comment: OCCASIONAL  . Drug use: No     Allergies   Latex; Other; and Tramadol   Review of Systems Review of Systems  Constitutional: Negative for fever.  HENT: Positive for rhinorrhea and sore throat. Negative for ear pain.   Eyes: Negative for visual disturbance.  Respiratory: Positive for cough, sputum production, shortness of breath and wheezing. Negative for hemoptysis.   Cardiovascular: Negative for chest pain, leg swelling and syncope.  Gastrointestinal: Negative for abdominal pain and vomiting.  Genitourinary: Negative for dysuria.  Musculoskeletal: Negative for neck pain.  Skin: Negative for rash.     Physical Exam Updated Vital Signs BP 127/71   Pulse 77   Temp 98.4 F (36.9 C) (Oral)   Resp 20   Ht 5\' 1"  (1.549 m)   Wt 97.1 kg (214 lb)   LMP 05/07/2017   SpO2 100%   BMI 40.43 kg/m   Physical Exam  Constitutional: She appears well-developed and well-nourished.  HENT:  Head: Normocephalic and atraumatic.  Right Ear: Tympanic membrane normal.  Left Ear: Tympanic membrane normal.  Nose: Mucosal edema present.  Mouth/Throat: Uvula is midline and mucous membranes are normal. Posterior oropharyngeal erythema present. No oropharyngeal exudate or posterior oropharyngeal edema.  Eyes: Pupils are equal, round, and reactive to light. Conjunctivae and EOM are normal.  Neck: Normal range of motion. Neck supple. No tracheal deviation present.  Cardiovascular: Normal rate, regular rhythm and normal heart sounds.  Pulmonary/Chest: Effort normal. No stridor. She has no wheezes. She has no rales.  Musculoskeletal: She exhibits no edema or tenderness.  Neurological: She is alert. GCS eye subscore is 4. GCS verbal subscore is 5. GCS motor subscore is 6.  Skin: Skin is warm and dry.  Psychiatric: She has a normal mood and  affect.     ED Treatments / Results  Labs (all labs ordered are listed, but only abnormal results are displayed) Labs Reviewed - No data to display  EKG None  Radiology Dg Chest 2 View  Result Date: 04/25/2018 CLINICAL DATA:  Shortness of breath and cough. EXAM: CHEST - 2 VIEW COMPARISON:  December 17, 2010 FINDINGS: The heart size and mediastinal contours are within normal limits. Both lungs are clear. The visualized skeletal structures are unremarkable. IMPRESSION: No active cardiopulmonary disease. Electronically Signed   By: Dorise Bullion III M.D   On: 04/25/2018 17:24    Procedures Procedures (including critical care time)  Medications Ordered in ED Medications - No data to display   Initial Impression / Assessment and Plan / ED Course  I have reviewed the triage vital signs and the nursing notes.  Pertinent labs & imaging results that were available during my care of the patient were reviewed by me and considered in my medical decision making (see chart for details).  Clinical Course as of Apr 25 2308  Mon Apr 25, 2018  1715 Patient with a history of asthma and seasonal allergies here with hoarse voice, cough, shortness of breath.  On exam her pulse ox 100% so doubt PE.  Lungs actually sound very clear to.  Getting a chest x-ray which likely to be negative.  Patient not a diabetic so likely can tolerate burst of prednisone and will also give her nasal steroid to start after that.   [MB]  3154 Patient's chest x-ray unremarkable.  I recommended to her nasal steroid and she would also like to start oral steroids as her chest is quite tight.   [MB]    Clinical Course User Index [MB] Hayden Rasmussen, MD     Final Clinical Impressions(s) / ED Diagnoses   Final diagnoses:  Seasonal allergies  Moderate persistent reactive airway disease with acute exacerbation    ED Discharge Orders        Ordered    fluticasone (FLONASE) 50 MCG/ACT nasal spray  Daily     04/25/18  1755    predniSONE (DELTASONE) 20 MG tablet  Daily  04/25/18 1755       Hayden Rasmussen, MD 04/25/18 [phone]

## 2018-06-24 ENCOUNTER — Encounter: Payer: Self-pay | Admitting: Obstetrics & Gynecology

## 2018-06-24 ENCOUNTER — Other Ambulatory Visit: Payer: Self-pay

## 2018-06-24 ENCOUNTER — Ambulatory Visit: Payer: BLUE CROSS/BLUE SHIELD | Admitting: Obstetrics & Gynecology

## 2018-06-24 VITALS — BP 120/76 | HR 72 | Resp 16 | Ht 61.0 in | Wt 217.0 lb

## 2018-06-24 DIAGNOSIS — Z01419 Encounter for gynecological examination (general) (routine) without abnormal findings: Secondary | ICD-10-CM | POA: Diagnosis not present

## 2018-06-24 DIAGNOSIS — Z0000 Encounter for general adult medical examination without abnormal findings: Secondary | ICD-10-CM | POA: Diagnosis not present

## 2018-06-24 NOTE — Progress Notes (Signed)
42 y.o. G1P0010 Single Black or [place] American female here for annual exam.  Doing well.  Needs blood work today.  Was fasting today.  Denies vaginal bleeding.  Had a little cramping last month.  Has been very happy since having surgery.  Still dating same person.  Not sure about long term possibility of relationship.  Patient's last menstrual period was 05/07/2017.          Sexually active: Yes.    The current method of family planning is status post hysterectomy.    Exercising: No.   Smoker:  no  Health Maintenance: Pap:  11/19/15 neg. HR HPV:neg   11/13/14 neg. HR HPV:neg  History of abnormal Pap:  Yes, + HR HPV  2006, 2007. MMG:  03/29/18 [place] Right BIRADS2:Benign. f/u 1 year  Colonoscopy:  Never BMD:   Never TDaP:  2012 Screening Labs: here today - fasting    reports that she has never smoked. She has never used smokeless tobacco. She reports that she drinks alcohol. She reports that she does not use drugs.  Past Medical History:  Diagnosis Date  . Anxiety    slight  . Asthma    albuterol rescue inhaler-uses prn, exacerbated by pet dander  . History of ectopic pregnancy 1999   S/P RIGHT SALPINGECTOMY  . History of herpes simplex type 2 infection 2005  . History of uterine fibroid   . Iron deficiency anemia   . Menorrhagia   . Neurofibromatosis [facility])    dx is child--  nonmalignant  . Pneumonia    years ago  . Seasonal allergies   . Uterine fibroid     Past Surgical History:  Procedure Laterality Date  . CYSTOSCOPY N/A 05/11/2017   Procedure: CYSTOSCOPY;  Surgeon: Megan Salon, MD;  Location: [facility];  Service: Gynecology;  Laterality: N/A;  . DILATATION & CURETTAGE/HYSTEROSCOPY WITH MYOSURE N/A 08/07/2016   Procedure: HYSTEROSCOPY WITH MYOSURE;  Surgeon: Megan Salon, MD;  Location[facility];  Service: Gynecology;  Laterality: N/A;  Fibroid resection  . IUD REMOVAL N/A 08/07/2016   Procedure: INTRAUTERINE DEVICE (IUD) REMOVAL;  Surgeon: Megan Salon,  MD;  Location[facility];  Service: Gynecology;  Laterality: N/A;  . IUD REMOVAL N/A 05/11/2017   Procedure: INTRAUTERINE DEVICE (IUD) REMOVAL;  Surgeon: Megan Salon, MD;  Location: [facility];  Service: Gynecology;  Laterality: N/A;  . LAPAROSCOPIC LYSIS OF ADHESIONS  05/11/2017   Procedure: LAPAROSCOPIC LYSIS OF ADHESIONS;  Surgeon: Megan Salon, MD;  Location: [facility];  Service: Gynecology;;  with cautery of endometroisis  . LAPAROSCOPY FOR ECTOPIC PREGNANCY  2000   RIGHT SALPINGECTOMY  . ROBOT ASSISTED MYOMECTOMY N/A 12/16/2012   Procedure: ROBOTIC ASSISTED MYOMECTOMY;  Surgeon: Governor Specking, MD;  Location: [facility];  Service: Gynecology;  Laterality: N/A;  . ROBOTIC ASSISTED LAPAROSCOPIC LYSIS OF ADHESION  12/16/2012   Procedure: ROBOTIC ASSISTED LAPAROSCOPIC LYSIS OF ADHESION;  Surgeon: Governor Specking, MD;  Location: [facility];  Service: Gynecology;;  with excision of perotoneal lesions  . TOTAL LAPAROSCOPIC HYSTERECTOMY WITH SALPINGECTOMY Bilateral 05/11/2017   Procedure: HYSTERECTOMY TOTAL LAPAROSCOPIC WITH LEFT SALPINGECTOMY;  Surgeon: Megan Salon, MD;  Location: [facility];  Service: Gynecology;  Laterality: Bilateral;  . WISDOM TOOTH EXTRACTION      Current Outpatient Medications  Medication Sig Dispense Refill  . albuterol (PROVENTIL HFA;VENTOLIN HFA) 108 (90 BASE) MCG/ACT inhaler Inhale 2 puffs into the lungs every 6 (six) hours as needed for wheezing or  shortness of breath.     . ALPRAZolam (XANAX) 0.25 MG tablet Take 0.25 mg by mouth daily as needed for anxiety.     . cetirizine (ZYRTEC) 10 MG tablet Take 10 mg by mouth daily.    . Ferrous Fumarate (HEMOCYTE - 106 MG FE) 324 (106 Fe) MG TABS tablet Take 1 tablet by mouth daily.    . fluticasone (FLONASE) 50 MCG/ACT nasal spray Place 1 spray into both nostrils daily. 16 g 2  . ibuprofen (ADVIL,MOTRIN) 800 MG tablet Take 1 tablet (800 mg total) by mouth every 8 (eight) hours as needed. (Patient taking differently: Take  800 mg by mouth every 8 (eight) hours as needed for moderate pain. ) 30 tablet 1  . Vitamin D, Ergocalciferol, (DRISDOL) 50000 units CAPS capsule Take 1 capsule (50,000 Units total) by mouth every 7 (seven) days. 30 capsule 3  . EPIPEN 2-PAK 0.3 MG/0.3ML DEVI 0.3 mg as needed (environmental allergies).      No current facility-administered medications for this visit.     Family History  Adopted: Yes  Problem Relation Age of Onset  . Allergies Mother        food  . Hypertension Mother   . Hyperlipidemia Mother   . Heart failure Mother   . Thyroid disease Mother   . Diabetes Mother   . Lung cancer Father   . Diabetes Sister   . Hypertension Sister   . Renal Disease Sister        dialysis  . Uterine cancer Paternal Grandmother   . Breast cancer Neg Hx     Review of Systems  All other systems reviewed and are negative.   Exam:   BP 120/76 (BP Location: Right Arm, Patient Position: Sitting, Cuff Size: Large)   Pulse 72   Resp 16   Ht 5\' 1"  (1.549 m)   Wt 217 lb (98.4 kg)   LMP 05/07/2017   BMI 41.00 kg/m    Height: 5\' 1"  (154.9 cm)  Ht Readings from Last 3 Encounters:  06/24/18 5\' 1"  (1.549 m)  04/25/18 5\' 1"  (1.549 m)  02/07/18 5\' 1"  (1.549 m)    General appearance: alert, cooperative and appears stated age Head: Normocephalic, without obvious abnormality, atraumatic Neck: no adenopathy, supple, symmetrical, trachea midline and thyroid normal to inspection and palpation Lungs: clear to auscultation bilaterally Breasts: normal appearance, no masses or tenderness Heart: regular rate and rhythm Abdomen: soft, non-tender; bowel sounds normal; no masses,  no organomegaly Extremities: extremities normal, atraumatic, no cyanosis or edema Skin: Skin color, texture, turgor normal. No rashes or lesions Lymph nodes: Cervical, supraclavicular, and axillary nodes normal. No abnormal inguinal nodes palpated Neurologic: Grossly normal   Pelvic: External genitalia:  no  lesions              Urethra:  normal appearing urethra with no masses, tenderness or lesions              Bartholins and Skenes: normal                 Vagina: normal appearing vagina with normal color and discharge, no lesions              Cervix: absent              Pap taken: No. Bimanual Exam:  Uterus:  uterus absent              Adnexa: no mass, fullness, tenderness  Rectovaginal: Confirms               Anus:  normal sphincter tone, no lesions  Chaperone was present for exam.  A:  Well Woman with normal exam H/O TLH, left salpingectomy, LOA, cystoscopy H/O HSV 2 in her 20's Neurofibromatosis  P:   Mammogram guidelines reviewed pap smear not indicated Lab work obtained today:  CBC, CMP, lipids, TSH, Vit D Vaccines UTD Return annually or prn

## 2018-06-25 LAB — COMPREHENSIVE METABOLIC PANEL
A/G RATIO: 1.7 (ref 1.2–2.2)
ALBUMIN: 4 g/dL (ref 3.5–5.5)
ALT: 11 IU/L (ref 0–32)
AST: 16 IU/L (ref 0–40)
Alkaline Phosphatase: 85 IU/L (ref 39–117)
BUN/Creatinine Ratio: 16 (ref 9–23)
BUN: 10 mg/dL (ref 6–24)
Bilirubin Total: 1.1 mg/dL (ref 0.0–1.2)
CALCIUM: 8.7 mg/dL (ref 8.7–10.2)
CO2: 19 mmol/L — AB (ref 20–29)
CREATININE: 0.62 mg/dL (ref 0.57–1.00)
Chloride: 104 mmol/L (ref 96–106)
GFR, EST AFRICAN AMERICAN: 130 mL/min/{1.73_m2} (ref >59)
GFR, EST NON AFRICAN AMERICAN: 112 mL/min/{1.73_m2} (ref >59)
GLOBULIN, TOTAL: 2.4 g/dL (ref 1.5–4.5)
Glucose: 81 mg/dL (ref 65–99)
POTASSIUM: 4 mmol/L (ref 3.5–5.2)
SODIUM: 137 mmol/L (ref 134–144)
TOTAL PROTEIN: 6.4 g/dL (ref 6.0–8.5)

## 2018-06-25 LAB — LIPID PANEL
CHOLESTEROL TOTAL: 139 mg/dL (ref 100–199)
Chol/HDL Ratio: 1.7 ratio (ref 0.0–4.4)
HDL: 82 mg/dL (ref >39)
LDL Calculated: 49 mg/dL (ref 0–99)
TRIGLYCERIDES: 39 mg/dL (ref 0–149)
VLDL Cholesterol Cal: 8 mg/dL (ref 5–40)

## 2018-06-25 LAB — CBC
HEMOGLOBIN: 10.4 g/dL — AB (ref 11.1–15.9)
Hematocrit: 32.9 % — ABNORMAL LOW (ref 34.0–46.6)
MCH: 26.3 pg — AB (ref 26.6–33.0)
MCHC: 31.6 g/dL (ref 31.5–35.7)
MCV: 83 fL (ref 79–97)
PLATELETS: 204 10*3/uL (ref 150–450)
RBC: 3.95 x10E6/uL (ref 3.77–5.28)
RDW: 14.6 % (ref 12.3–15.4)
WBC: 4.7 10*3/uL (ref 3.4–10.8)

## 2018-06-25 LAB — VITAMIN D 25 HYDROXY (VIT D DEFICIENCY, FRACTURES): VIT D 25 HYDROXY: 14.1 ng/mL — AB (ref 30.0–100.0)

## 2018-06-25 LAB — TSH: TSH: 1.08 u[IU]/mL (ref 0.450–4.500)

## 2018-06-27 ENCOUNTER — Encounter: Payer: Self-pay | Admitting: Obstetrics & Gynecology

## 2018-06-28 ENCOUNTER — Other Ambulatory Visit: Payer: Self-pay | Admitting: *Deleted

## 2018-06-28 DIAGNOSIS — E559 Vitamin D deficiency, unspecified: Secondary | ICD-10-CM

## 2018-06-28 DIAGNOSIS — D649 Anemia, unspecified: Secondary | ICD-10-CM

## 2018-06-28 MED ORDER — VITAMIN D (ERGOCALCIFEROL) 1.25 MG (50000 UNIT) PO CAPS: 50000.0000 [IU] | ORAL_CAPSULE | ORAL | 0 refills | Status: AC

## 2018-09-06 DIAGNOSIS — Q8501 Neurofibromatosis, type 1: Secondary | ICD-10-CM | POA: Diagnosis not present

## 2018-09-22 ENCOUNTER — Other Ambulatory Visit (INDEPENDENT_AMBULATORY_CARE_PROVIDER_SITE_OTHER): Payer: BLUE CROSS/BLUE SHIELD

## 2018-09-22 DIAGNOSIS — E559 Vitamin D deficiency, unspecified: Secondary | ICD-10-CM

## 2018-09-22 DIAGNOSIS — D649 Anemia, unspecified: Secondary | ICD-10-CM | POA: Diagnosis not present

## 2018-09-23 LAB — IRON,TIBC AND FERRITIN PANEL
FERRITIN: 9 ng/mL — AB (ref 15–150)
IRON SATURATION: 8 % — AB (ref 15–55)
IRON: 27 ug/dL (ref 27–159)
TIBC: 357 ug/dL (ref 250–450)
UIBC: 330 ug/dL (ref 131–425)

## 2018-09-23 LAB — VITAMIN D 25 HYDROXY (VIT D DEFICIENCY, FRACTURES): VIT D 25 HYDROXY: 18.4 ng/mL — AB (ref 30.0–100.0)

## 2018-09-26 ENCOUNTER — Telehealth: Payer: Self-pay | Admitting: *Deleted

## 2018-09-26 DIAGNOSIS — D649 Anemia, unspecified: Secondary | ICD-10-CM

## 2018-09-26 MED ORDER — VITAMIN D (ERGOCALCIFEROL) 1.25 MG (50000 UNIT) PO CAPS: 50000.0000 [IU] | ORAL_CAPSULE | ORAL | 3 refills | Status: DC

## 2018-09-26 NOTE — Telephone Encounter (Signed)
Notes recorded by Burnice Logan, RN on 09/26/2018 at 1:31 PM EST Left message to call Sharee Pimple, RN at [facility].   Next AEX 06/25/20

## 2018-09-26 NOTE — Telephone Encounter (Signed)
-----   Message from Megan Salon, MD sent at 09/23/2018  8:27 AM EST ----- Please let pt know her ferritin is still low and I think this is contributing to the mild anemia.  Sometimes it is just hard to rebuild iron stores even after bleeding stops.  She's had a hysterectomy.  I want her to see hematology for iron infusion as this should resolve the issue.  Also, her Vit D was 18.  It is improved from 14 but she needs to stay on the Vit D 50K weekly until next AEX and I will check the level again then.  Thanks.

## 2018-09-26 NOTE — Telephone Encounter (Signed)
Spoke with patient, advised as seen below per Dr. Sabra Heck. Patient accepts referral to hematology/ Dr. Marin Olp, order placed . Patient is aware she will be contacted with appt details once scheduled. Vit D Rx to verified pharmacy. Patient verbalizes understanding and is agreeable.   Routing to [employer] .   Encounter closed.

## 2018-09-27 ENCOUNTER — Telehealth: Payer: Self-pay | Admitting: Obstetrics & Gynecology

## 2018-09-27 NOTE — Telephone Encounter (Addendum)
Left voicemail regarding referral appointment. The information is listed below. Should the patient need to cancel or reschedule this appointment, Please advise them to call the office they've been referred to in order to reschedule.  HEMATOLOGY/ONCOLOGY DR. Vaugh[facility] [place] [address] [place], [place] [address] PHONE: [phone]   Dr. Marin Olp 10/25/18 @ 10:30 am. Please arrive 15 minutes early and bring your insurance card and photo id and list of medications.

## 2018-09-27 NOTE — Telephone Encounter (Signed)
° °  Left voicemail regarding referral appointment. The information is listed below. Should the patient need to cancel or reschedule this appointment, Please advise them to call the office they've been referred to in order to reschedule.  HEMATOLOGY/ONCOLOGY DR. Vaughan [facility] [place] [address] [place], [place] [address] PHONE: [phone]  Dr. Marin Olp 10/25/18 @ 10:30 am. Please arrive 15 minutes early and bring your insurance card and photo id and list of medications.

## 2018-10-05 DIAGNOSIS — M25561 Pain in right knee: Secondary | ICD-10-CM | POA: Diagnosis not present

## 2018-10-05 DIAGNOSIS — M545 Low back pain: Secondary | ICD-10-CM | POA: Diagnosis not present

## 2018-10-18 ENCOUNTER — Telehealth: Payer: Self-pay | Admitting: Obstetrics & Gynecology

## 2018-10-18 NOTE — Telephone Encounter (Signed)
Left voicemail regarding referral appointment. The information is listed below. Should the patient need to cancel or reschedule this appointment, Please advise them to call the office they've been referred to in order to reschedule.  [address]  [place], [place] [address] [address] [place], [place]   Phone: [phone]  Dr. Marin Olp 10/25/18 @ 10:30 am. Please arrive 15 minutes early and bring your insurance card and photo id and list of medications.

## 2018-10-25 ENCOUNTER — Inpatient Hospital Stay: Payer: BLUE CROSS/BLUE SHIELD | Attending: Hematology & Oncology

## 2018-10-25 ENCOUNTER — Inpatient Hospital Stay: Payer: BLUE CROSS/BLUE SHIELD

## 2018-10-25 ENCOUNTER — Inpatient Hospital Stay (HOSPITAL_BASED_OUTPATIENT_CLINIC_OR_DEPARTMENT_OTHER): Payer: BLUE CROSS/BLUE SHIELD | Admitting: Family

## 2018-10-25 VITALS — BP 136/84 | HR 89 | Temp 98.3°F | Resp 17 | Wt 223.5 lb

## 2018-10-25 DIAGNOSIS — Q8502 Neurofibromatosis, type 2: Secondary | ICD-10-CM | POA: Diagnosis not present

## 2018-10-25 DIAGNOSIS — D500 Iron deficiency anemia secondary to blood loss (chronic): Secondary | ICD-10-CM

## 2018-10-25 DIAGNOSIS — D509 Iron deficiency anemia, unspecified: Secondary | ICD-10-CM | POA: Diagnosis not present

## 2018-10-25 LAB — CMP (CANCER CENTER ONLY)
ALT: 11 U/L (ref 0–44)
AST: 14 U/L — AB (ref 15–41)
Albumin: 4.5 g/dL (ref 3.5–5.0)
Alkaline Phosphatase: 91 U/L (ref 38–126)
Anion gap: 7 (ref 5–15)
BUN: 12 mg/dL (ref 6–20)
CHLORIDE: 106 mmol/L (ref 98–111)
CO2: 26 mmol/L (ref 22–32)
CREATININE: 0.58 mg/dL (ref 0.44–1.00)
Calcium: 9.3 mg/dL (ref 8.9–10.3)
GFR, Est AFR Am: >60 mL/min (ref >60)
Glucose, Bld: 89 mg/dL (ref 70–99)
Potassium: 3.8 mmol/L (ref 3.5–5.1)
Sodium: 139 mmol/L (ref 135–145)
Total Bilirubin: 0.9 mg/dL (ref 0.3–1.2)
Total Protein: 7.3 g/dL (ref 6.5–8.1)

## 2018-10-25 LAB — CBC WITH DIFFERENTIAL (CANCER CENTER ONLY)
Abs Immature Granulocytes: 0.01 10*3/uL (ref 0.00–0.07)
Basophils Absolute: 0 10*3/uL (ref 0.0–0.1)
Basophils Relative: 1 %
Eosinophils Absolute: 0.1 10*3/uL (ref 0.0–0.5)
Eosinophils Relative: 2 %
HCT: 40.1 % (ref 36.0–46.0)
HEMOGLOBIN: 12.7 g/dL (ref 12.0–15.0)
Immature Granulocytes: 0 %
Lymphocytes Relative: 42 %
Lymphs Abs: 1.6 10*3/uL (ref 0.7–4.0)
MCH: 28.5 pg (ref 26.0–34.0)
MCHC: 31.7 g/dL (ref 30.0–36.0)
MCV: 90.1 fL (ref 80.0–100.0)
Monocytes Absolute: 0.2 10*3/uL (ref 0.1–1.0)
Monocytes Relative: 6 %
Neutro Abs: 1.9 10*3/uL (ref 1.7–7.7)
Neutrophils Relative %: 49 %
Platelet Count: 213 10*3/uL (ref 150–400)
RBC: 4.45 MIL/uL (ref 3.87–5.11)
RDW: 14.1 % (ref 11.5–15.5)
WBC Count: 3.9 10*3/uL — ABNORMAL LOW (ref 4.0–10.5)
nRBC: 0 % (ref 0.0–0.2)

## 2018-10-25 LAB — RETICULOCYTES
Immature Retic Fract: 15.4 % (ref 2.3–15.9)
RBC.: 4.45 MIL/uL (ref 3.87–5.11)
Retic Count, Absolute: 51.2 10*3/uL (ref 19.0–186.0)
Retic Ct Pct: 1.2 % (ref 0.4–3.1)

## 2018-10-25 LAB — SAVE SMEAR(SSMR), FOR PROVIDER SLIDE REVIEW

## 2018-10-25 NOTE — [facility] Notes (Signed)
Hematology/Oncology Consultation   Name: Lacey Lee      MRN: [id number]    Location: Room/bed info not found  Date: 10/25/2018 Time:11:50 AM   REFERRING PHYSICIAN: Megan Salon, MD  REASON FOR CONSULT: Iron deficiency anemia    DIAGNOSIS: Iron deficiency anemia   HISTORY OF PRESENT ILLNESS: Ms. Lacey Lee is a very pleasant 43 yo African American female with history of iron deficiency anemia.  Her iron saturation in December was 8% and ferritin 9.  Hgb is now 12.7 up from 10.4 and MCV up to 90 from 83.  She is currently on an oral iron supplement daily but sometimes forgets to take.  She is on a weekly vitamin D supplement as well.  She had a hysterectomy in 2018 but states that prior to her surgery her cycles her irregular and quite heavy.  She is symptomatic with fatigue, chewing/craving ice, chills, lightheadedness and SOB with over exertion.  She has had some episodes of palpitations but is unsure if this may be related to stress. Unfortunately, her sister passed away recently and she is having a hard time with this.  She is not sleeping well at night.  She does not have an appetite but is trying to stay hydrated. Her weight is stable.  She has changed from constipation to loose stool once a day. She has had some intermittent abdominal pain with this.  She has noted some blood on her toilet tissue after straining with a BM.  No other episodes of bleeding, no bruising or petechiae.  She has never needed a blood transfusion or received IV iron.  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 She states that also has one on her spine and at the base of her brain that are monitored with an MRI every 2 years. She also has the associated caf au lait spots on her chest and abdomen.  She has history of 1 ectopic pregnancy and had to have the effected fallopian tube removed.   Her sister had history of anemia related to renal failure.  No personal  or familial history of sickle cel disease or trait that she is aware of.  No fever, n/v, cough, rash, chest pain or changes in bladder habits.  No swelling or tenderness in her extremities at this time.  She has positional numbness and tingling in her hands in the mornings that dissipates  She does not smoke.  She drinks 1-2 glasses of wine 3-4 days a week.  She works full time for [employer].  She plans to restart exercising in the next month.   ROS: All other 10 point review of systems is negative.   PAST MEDICAL HISTORY:   Past Medical History:  Diagnosis Date  . Anxiety    slight  . Asthma    albuterol rescue inhaler-uses prn, exacerbated by pet dander  . History of ectopic pregnancy 1999   S/P RIGHT SALPINGECTOMY  . History of herpes simplex type 2 infection 2005  . History of uterine fibroid   . Neurofibromatosis [facility])    dx is child--  nonmalignant  . Pneumonia    years ago  . Seasonal allergies     ALLERGIES: Allergies  Allergen Reactions  . Latex Other (See Comments)    DERMITITIS  . Other Hives and Swelling    PORK (alphagal)-lip/tongue swelling, hives  . Tramadol Nausea And Vomiting      MEDICATIONS:  Current Outpatient Medications on File  Prior to Visit  Medication Sig Dispense Refill  . albuterol (PROVENTIL HFA;VENTOLIN HFA) 108 (90 BASE) MCG/ACT inhaler Inhale 2 puffs into the lungs every 6 (six) hours as needed for wheezing or shortness of breath.     . ALPRAZolam (XANAX) 0.25 MG tablet Take 0.25 mg by mouth daily as needed for anxiety.     . cetirizine (ZYRTEC) 10 MG tablet Take 10 mg by mouth daily.    Marland Kitchen EPIPEN 2-PAK 0.3 MG/0.3ML DEVI 0.3 mg as needed (environmental allergies).     . Ferrous Fumarate (HEMOCYTE - 106 MG FE) 324 (106 Fe) MG TABS tablet Take 1 tablet by mouth daily.    . fluticasone (FLONASE) 50 MCG/ACT nasal spray Place 1 spray into both nostrils daily. 16 g 2  . ibuprofen (ADVIL,MOTRIN) 800 MG tablet Take 1 tablet (800 mg total)  by mouth every 8 (eight) hours as needed. (Patient taking differently: Take 800 mg by mouth every 8 (eight) hours as needed for moderate pain. ) 30 tablet 1  . Vitamin D, Ergocalciferol, (DRISDOL) 1.25 MG (50000 UT) CAPS capsule Take 1 capsule (50,000 Units total) by mouth every 7 (seven) days. 12 capsule 3   No current facility-administered medications on file prior to visit.      PAST SURGICAL HISTORY Past Surgical History:  Procedure Laterality Date  . CYSTOSCOPY N/A 05/11/2017   Procedure: CYSTOSCOPY;  Surgeon: Megan Salon, MD;  Location: [facility];  Service: Gynecology;  Laterality: N/A;  . DILATATION & CURETTAGE/HYSTEROSCOPY WITH MYOSURE N/A 08/07/2016   Procedure: HYSTEROSCOPY WITH MYOSURE;  Surgeon: Megan Salon, MD;  Location: [facility];  Service: Gynecology;  Laterality: N/A;  Fibroid resection  . IUD REMOVAL N/A 08/07/2016   Procedure: INTRAUTERINE DEVICE (IUD) REMOVAL;  Surgeon: Megan Salon, MD;  Location: [facility];  Service: Gynecology;  Laterality: N/A;  . IUD REMOVAL N/A 05/11/2017   Procedure: INTRAUTERINE DEVICE (IUD) REMOVAL;  Surgeon: Megan Salon, MD;  Location: [facility];  Service: Gynecology;  Laterality: N/A;  . LAPAROSCOPIC LYSIS OF ADHESIONS  05/11/2017   Procedure: LAPAROSCOPIC LYSIS OF ADHESIONS;  Surgeon: Megan Salon, MD;  Location: [facility];  Service: Gynecology;;  with cautery of endometroisis  . LAPAROSCOPY FOR ECTOPIC PREGNANCY  2000   RIGHT SALPINGECTOMY  . ROBOT ASSISTED MYOMECTOMY N/A 12/16/2012   Procedure: ROBOTIC ASSISTED MYOMECTOMY;  Surgeon: Governor Specking, MD;  Location: [facility];  Service: Gynecology;  Laterality: N/A;  . ROBOTIC ASSISTED LAPAROSCOPIC LYSIS OF ADHESION  12/16/2012   Procedure: ROBOTIC ASSISTED LAPAROSCOPIC LYSIS OF ADHESION;  Surgeon: Governor Specking, MD;  Location: [facility];  Service: Gynecology;;  with excision of perotoneal lesions  . TOTAL LAPAROSCOPIC HYSTERECTOMY WITH SALPINGECTOMY Bilateral  05/11/2017   Procedure: HYSTERECTOMY TOTAL LAPAROSCOPIC WITH LEFT SALPINGECTOMY;  Surgeon: Megan Salon, MD;  Location: [facility];  Service: Gynecology;  Laterality: Bilateral;  . WISDOM TOOTH EXTRACTION      FAMILY HISTORY: Family History  Adopted: Yes  Problem Relation Age of Onset  . Allergies Mother        food  . Hypertension Mother   . Hyperlipidemia Mother   . Heart failure Mother   . Thyroid disease Mother   . Diabetes Mother   . Lung cancer Father   . Diabetes Sister   . Hypertension Sister   . Renal Disease Sister        dialysis  . Uterine cancer Paternal Grandmother   . Breast cancer Neg Hx  SOCIAL HISTORY:  reports that she has never smoked. She has never used smokeless tobacco. She reports current alcohol use. She reports that she does not use drugs.  PERFORMANCE STATUS: The patient's performance status is 1 - Symptomatic but completely ambulatory  PHYSICAL EXAM: Most Recent Vital Signs: Blood pressure 136/84, pulse 89, temperature 98.3 F (36.8 C), temperature source Oral, resp. rate 17, weight 223 lb 8 oz (101.4 kg), last menstrual period 05/07/2017, SpO2 100 %. BP 136/84 (Patient Position: Sitting)   Pulse 89   Temp 98.3 F (36.8 C) (Oral)   Resp 17   Wt 223 lb 8 oz (101.4 kg)   LMP 05/07/2017   SpO2 100%   BMI 42.23 kg/m   General Appearance:    Alert, cooperative, no distress, appears stated age  Head:    Normocephalic, without obvious abnormality, atraumatic  Eyes:    PERRL, conjunctiva/corneas clear, EOM's intact, fundi    benign, both eyes        Throat:   Lips, mucosa, and tongue normal; teeth and gums normal  Neck:   Supple, symmetrical, trachea midline, no adenopathy;    thyroid:  no enlargement/tenderness/nodules; no carotid   bruit or JVD  Back:     Symmetric, no curvature, ROM normal, no CVA tenderness  Lungs:     Clear to auscultation bilaterally, respirations unlabored  Chest Wall:    No tenderness or deformity   Heart:     Regular rate and rhythm, S1 and S2 normal, no murmur, rub   or gallop     Abdomen:     Soft, non-tender, bowel sounds active all four quadrants,    no masses, no organomegaly        Extremities:   Extremities normal, atraumatic, no cyanosis or edema  Pulses:   2+ and symmetric all extremities  Skin:   Skin color, texture, turgor normal, no rashes or lesions  Lymph nodes:   Cervical, supraclavicular, and axillary nodes normal  Neurologic:   CNII-XII intact, normal strength, sensation and reflexes    throughout    LABORATORY DATA:  Results for orders placed or performed in visit on 10/25/18 (from the past 48 hour(s))  Save Smear (SSMR)     Status: None   Collection Time: 10/25/18 10:22 AM  Result Value Ref Range   Smear Review SMEAR STAINED AND AVAILABLE FOR REVIEW     Comment: Performed at [facility] at [facility], [address] [place], [place] [address]  Reticulocytes     Status: None   Collection Time: 10/25/18 10:22 AM  Result Value Ref Range   Retic Ct Pct 1.2 0.4 - 3.1 %   RBC. 4.45 3.87 - 5.11 MIL/uL   Retic Count, Absolute 51.2 19.0 - 186.0 K/uL   Immature Retic Fract 15.4 2.3 - 15.9 %    Comment: Performed at [facility] at [facility], [address], [place], [place] [address]  CMP ([place] only)     Status: Abnormal   Collection Time: 10/25/18 10:22 AM  Result Value Ref Range   Sodium 139 135 - 145 mmol/L   Potassium 3.8 3.5 - 5.1 mmol/L   Chloride 106 98 - 111 mmol/L   CO2 26 22 - 32 mmol/L   Glucose, Bld 89 70 - 99 mg/dL   BUN 12 6 - 20 mg/dL   Creatinine 0.58 0.44 - 1.00 mg/dL   Calcium 9.3 8.9 - 10.3 mg/dL  Total Protein 7.3 6.5 - 8.1 g/dL   Albumin 4.5 3.5 - 5.0 g/dL   AST 14 (L) 15 - 41 U/L   ALT 11 0 - 44 U/L   Alkaline Phosphatase 91 38 - 126 U/L   Total Bilirubin 0.9 0.3 - 1.2 mg/dL   GFR, Est Non Af Am >60 >60 mL/min   GFR, Est AFR Am >60 >60 mL/min   Anion gap 7 5 - 15    Comment:  Performed at [facility] at [facility], [address] [place], [place] [address]  CBC with Differential ([facility] Center Only)     Status: Abnormal   Collection Time: 10/25/18 10:22 AM  Result Value Ref Range   WBC Count 3.9 (L) 4.0 - 10.5 K/uL   RBC 4.45 3.87 - 5.11 MIL/uL   Hemoglobin 12.7 12.0 - 15.0 g/dL   HCT 40.1 36.0 - 46.0 %   MCV 90.1 80.0 - 100.0 fL   MCH 28.5 26.0 - 34.0 pg   MCHC 31.7 30.0 - 36.0 g/dL   RDW 14.1 11.5 - 15.5 %   Platelet Count 213 150 - 400 K/uL   nRBC 0.0 0.0 - 0.2 %   Neutrophils Relative % 49 %   Neutro Abs 1.9 1.7 - 7.7 K/uL   Lymphocytes Relative 42 %   Lymphs Abs 1.6 0.7 - 4.0 K/uL   Monocytes Relative 6 %   Monocytes Absolute 0.2 0.1 - 1.0 K/uL   Eosinophils Relative 2 %   Eosinophils Absolute 0.1 0.0 - 0.5 K/uL   Basophils Relative 1 %   Basophils Absolute 0.0 0.0 - 0.1 K/uL   Immature Granulocytes 0 %   Abs Immature Granulocytes 0.01 0.00 - 0.07 K/uL    Comment: Performed at [facility] at [facility], [address], [place], [place] [address]      RADIOGRAPHY: No results found.     PATHOLOGY: None  ASSESSMENT/PLAN: Ms. Ketner is a very pleasant 43 yo African American female with history of iron deficiency anemia. She is taking an oral iron supplement and has had an improvement in her Hgb.  We will have her continue to take her supplement daily.  We will see what her iron studies show and if still low we will consider IV iron.  We will go ahead and plan to see her back in another 2 months for follow-up.   All questions were answered and she is in agreement with the plan. She will contact our office with any questions or concerns. We can certainly see the patient much sooner if necessary.  She was discussed with and also seen by Dr. Marin Olp and he is in agreement with the aforementioned.   [name]     Addendum: I saw and examined the patient with Judson Roch.  Ms. Erck is  very nice.  We will have to see what her iron levels are.  I know about a month or so ago, she had an iron saturation of 8% and her ferritin was only 9.  She really does not have a lot of symptoms of iron deficiency.  She is only been on oral iron for about a month.  We will continue to have her take oral iron.  I really do not see an indication that we need IV iron given that her hemoglobin has responded so well.  Given that her MCV is normal, I would have to think that her iron  levels should be okay.  I did look at her blood under the microscope.  Her blood smear looked relatively benign.  I did not see any hypochromic or microcytic red blood cells.  There were no nucleated red blood cells.  I saw no teardrop cells.  White blood cells and platelets looked normal.  She had no hypersegmented polys.  She no immature myeloid or lymphoid cells.  We will plan to get her back to see [place] in another couple of months.  She really has an interesting job.  I am sure that she has seen several of our patients before.  We spent about 45 minutes with Ms. Toney Rakes.  All the time spent face-to-face.  We answered her questions.  We reassured her that we do not feel that she had anything serious that was going on.    Lattie Haw, MD

## 2018-10-26 LAB — ERYTHROPOIETIN: Erythropoietin: 16.7 m[IU]/mL (ref 2.6–18.5)

## 2018-10-26 LAB — IRON AND TIBC
Iron: 77 ug/dL (ref 41–142)
Saturation Ratios: 21 % (ref 21–57)
TIBC: 369 ug/dL (ref 236–444)
UIBC: 291 ug/dL (ref 120–384)

## 2018-10-26 LAB — FERRITIN: Ferritin: 4 ng/mL — ABNORMAL LOW (ref 11–307)

## 2018-10-27 LAB — HEMOGLOBINOPATHY EVALUATION
Hgb A2 Quant: 2.1 % (ref 1.8–3.2)
Hgb A: 97.9 % (ref 96.4–98.8)
Hgb C: 0 %
Hgb F Quant: 0 % (ref 0.0–2.0)
Hgb S Quant: 0 %
Hgb Variant: 0 %

## 2018-10-28 ENCOUNTER — Telehealth: Payer: Self-pay | Admitting: Family

## 2018-10-28 ENCOUNTER — Other Ambulatory Visit: Payer: Self-pay | Admitting: Family

## 2018-10-28 DIAGNOSIS — D509 Iron deficiency anemia, unspecified: Secondary | ICD-10-CM | POA: Insufficient documentation

## 2018-10-28 DIAGNOSIS — D500 Iron deficiency anemia secondary to blood loss (chronic): Secondary | ICD-10-CM

## 2018-10-28 NOTE — Telephone Encounter (Signed)
Appointments scheduled and I left detailed message and asked the patient to call me back if she needed to reschedule per 1/31 sch msh

## 2018-11-02 ENCOUNTER — Other Ambulatory Visit: Payer: Self-pay

## 2018-11-02 ENCOUNTER — Inpatient Hospital Stay: Payer: BLUE CROSS/BLUE SHIELD | Attending: Hematology & Oncology

## 2018-11-02 VITALS — BP 124/55 | HR 98 | Temp 98.5°F | Resp 18

## 2018-11-02 DIAGNOSIS — D509 Iron deficiency anemia, unspecified: Secondary | ICD-10-CM | POA: Diagnosis not present

## 2018-11-02 DIAGNOSIS — D500 Iron deficiency anemia secondary to blood loss (chronic): Secondary | ICD-10-CM

## 2018-11-02 MED ORDER — SODIUM CHLORIDE 0.9 % IV SOLN
Freq: Once | INTRAVENOUS | Status: AC
  Administered 2018-11-02: 10:00:00 via INTRAVENOUS
  Filled 2018-11-02: qty 250

## 2018-11-02 MED ORDER — SODIUM CHLORIDE 0.9 % IV SOLN
510.0000 mg | Freq: Once | INTRAVENOUS | Status: AC
  Administered 2018-11-02: 510 mg via INTRAVENOUS
  Filled 2018-11-02: qty 17

## 2018-11-02 NOTE — Patient Instructions (Signed)

## 2018-11-03 LAB — ALPHA-THALASSEMIA GENOTYPR

## 2018-11-08 ENCOUNTER — Encounter: Payer: Self-pay | Admitting: *Deleted

## 2018-11-09 ENCOUNTER — Inpatient Hospital Stay: Payer: BLUE CROSS/BLUE SHIELD

## 2018-11-09 ENCOUNTER — Other Ambulatory Visit: Payer: Self-pay

## 2018-11-09 VITALS — BP 134/70 | HR 97 | Temp 98.8°F | Resp 18

## 2018-11-09 DIAGNOSIS — D509 Iron deficiency anemia, unspecified: Secondary | ICD-10-CM | POA: Diagnosis not present

## 2018-11-09 DIAGNOSIS — D500 Iron deficiency anemia secondary to blood loss (chronic): Secondary | ICD-10-CM

## 2018-11-09 MED ORDER — SODIUM CHLORIDE 0.9 % IV SOLN
Freq: Once | INTRAVENOUS | Status: AC
  Administered 2018-11-09: 14:00:00 via INTRAVENOUS
  Filled 2018-11-09: qty 250

## 2018-11-09 MED ORDER — SODIUM CHLORIDE 0.9 % IV SOLN
510.0000 mg | Freq: Once | INTRAVENOUS | Status: AC
  Administered 2018-11-09: 510 mg via INTRAVENOUS
  Filled 2018-11-09: qty 17

## 2018-11-09 NOTE — Patient Instructions (Signed)

## 2018-12-28 ENCOUNTER — Inpatient Hospital Stay: Payer: BLUE CROSS/BLUE SHIELD | Attending: Hematology & Oncology | Admitting: Hematology & Oncology

## 2018-12-28 ENCOUNTER — Encounter: Payer: Self-pay | Admitting: Hematology & Oncology

## 2018-12-28 ENCOUNTER — Inpatient Hospital Stay: Payer: BLUE CROSS/BLUE SHIELD

## 2018-12-28 ENCOUNTER — Other Ambulatory Visit: Payer: Self-pay

## 2018-12-28 VITALS — BP 126/70 | HR 92 | Temp 98.3°F | Resp 20 | Wt 223.1 lb

## 2018-12-28 DIAGNOSIS — D509 Iron deficiency anemia, unspecified: Secondary | ICD-10-CM | POA: Diagnosis not present

## 2018-12-28 DIAGNOSIS — D500 Iron deficiency anemia secondary to blood loss (chronic): Secondary | ICD-10-CM

## 2018-12-28 LAB — CMP (CANCER CENTER ONLY)
ALT: 17 U/L (ref 0–44)
AST: 22 U/L (ref 15–41)
Albumin: 4.3 g/dL (ref 3.5–5.0)
Alkaline Phosphatase: 98 U/L (ref 38–126)
Anion gap: 7 (ref 5–15)
BUN: 13 mg/dL (ref 6–20)
CO2: 26 mmol/L (ref 22–32)
Calcium: 8.9 mg/dL (ref 8.9–10.3)
Chloride: 103 mmol/L (ref 98–111)
Creatinine: 0.78 mg/dL (ref 0.44–1.00)
GFR, Est AFR Am: >60 mL/min (ref >60)
GFR, Estimated: >60 mL/min (ref >60)
Glucose, Bld: 90 mg/dL (ref 70–99)
Potassium: 4.2 mmol/L (ref 3.5–5.1)
Sodium: 136 mmol/L (ref 135–145)
Total Bilirubin: 0.7 mg/dL (ref 0.3–1.2)
Total Protein: 7.4 g/dL (ref 6.5–8.1)

## 2018-12-28 LAB — CBC WITH DIFFERENTIAL (CANCER CENTER ONLY)
Abs Immature Granulocytes: 0.03 10*3/uL (ref 0.00–0.07)
Basophils Absolute: 0 10*3/uL (ref 0.0–0.1)
Basophils Relative: 1 %
Eosinophils Absolute: 0.1 10*3/uL (ref 0.0–0.5)
Eosinophils Relative: 1 %
HCT: 39.2 % (ref 36.0–46.0)
Hemoglobin: 13.1 g/dL (ref 12.0–15.0)
Immature Granulocytes: 0 %
Lymphocytes Relative: 23 %
Lymphs Abs: 1.6 10*3/uL (ref 0.7–4.0)
MCH: 30.8 pg (ref 26.0–34.0)
MCHC: 33.4 g/dL (ref 30.0–36.0)
MCV: 92 fL (ref 80.0–100.0)
Monocytes Absolute: 0.5 10*3/uL (ref 0.1–1.0)
Monocytes Relative: 8 %
Neutro Abs: 4.5 10*3/uL (ref 1.7–7.7)
Neutrophils Relative %: 67 %
Platelet Count: 227 10*3/uL (ref 150–400)
RBC: 4.26 MIL/uL (ref 3.87–5.11)
RDW: 13.4 % (ref 11.5–15.5)
WBC Count: 6.8 10*3/uL (ref 4.0–10.5)
nRBC: 0 % (ref 0.0–0.2)

## 2018-12-28 LAB — RETICULOCYTES
Immature Retic Fract: 9.9 % (ref 2.3–15.9)
RBC.: 4.22 MIL/uL (ref 3.87–5.11)
Retic Count, Absolute: 53.6 10*3/uL (ref 19.0–186.0)
Retic Ct Pct: 1.3 % (ref 0.4–3.1)

## 2018-12-28 NOTE — [facility] Notes (Signed)
Hematology and Oncology Follow Up Visit  Lacey Lee [id number] [birth year] 43 y.o. 12/28/2018   Principle Diagnosis:   Iron deficiency anemia  Current Therapy:    IV iron as indicated-Feraheme given on 11/09/2018     Interim History:  Lacey Lee is back for follow-up.  This is her second office visit.  We first saw her back in January.  At that time, her ferritin was only 4.  Iron saturation was 21%.  We went and gave her 2 doses of IV iron.  This is helped a little bit.  She still feels a little tired.  I am not sure why she would feel tired.  I would think that her iron levels should be okay.  Her MCV is 92.  She has had no issues with nausea or vomiting.  There is been no bleeding.  She is had no change in bowel or bladder habits.  She has had no leg swelling.  She is still working.  She works for the [employer].  She says that they really have cut back on patients.  She has had no fever.  She has had some issues with her allergies.  She has had some asthma issues.  Overall, her performance status is ECOG 1.  Medications:  Current Outpatient Medications:  .  albuterol (PROVENTIL HFA;VENTOLIN HFA) 108 (90 BASE) MCG/ACT inhaler, Inhale 2 puffs into the lungs every 6 (six) hours as needed for wheezing or shortness of breath. , Disp: , Rfl:  .  ALPRAZolam (XANAX) 0.25 MG tablet, Take 0.25 mg by mouth daily as needed for anxiety. , Disp: , Rfl:  .  cetirizine (ZYRTEC) 10 MG tablet, Take 10 mg by mouth daily., Disp: , Rfl:  .  EPIPEN 2-PAK 0.3 MG/0.3ML DEVI, 0.3 mg as needed (environmental allergies). , Disp: , Rfl:  .  Ferrous Fumarate (HEMOCYTE - 106 MG FE) 324 (106 Fe) MG TABS tablet, Take 1 tablet by mouth daily., Disp: , Rfl:  .  ibuprofen (ADVIL,MOTRIN) 800 MG tablet, Take 1 tablet (800 mg total) by mouth every 8 (eight) hours as needed. (Patient taking differently: Take 800 mg by mouth every 8 (eight) hours as needed for moderate pain. ), Disp: 30 tablet, Rfl: 1 .   Vitamin D, Ergocalciferol, (DRISDOL) 1.25 MG (50000 UT) CAPS capsule, Take 1 capsule (50,000 Units total) by mouth every 7 (seven) days., Disp: 12 capsule, Rfl: 3 .  fluticasone (FLONASE) 50 MCG/ACT nasal spray, Place 1 spray into both nostrils daily., Disp: 16 g, Rfl: 2 .  meloxicam (MOBIC) 7.5 MG tablet, TK 1 T PO BID, Disp: , Rfl:   Allergies:  Allergies  Allergen Reactions  . Latex Other (See Comments)    DERMITITIS  . Other Hives and Swelling    PORK (alphagal)-lip/tongue swelling, hives  . Tramadol Nausea And Vomiting    Past Medical History, Surgical history, Social history, and Family History were reviewed and updated.  Review of Systems: Review of Systems  Constitutional: Negative.   HENT:  Negative.   Eyes: Negative.   Respiratory: Positive for wheezing.   Cardiovascular: Negative.   Gastrointestinal: Negative.   Endocrine: Negative.   Genitourinary: Negative.    Musculoskeletal: Negative.   Skin: Negative.   Neurological: Negative.   Hematological: Negative.   Psychiatric/Behavioral: Negative.     Physical Exam:  weight is 223 lb 1.9 oz (101.2 kg). Her oral temperature is 98.3 F (36.8 C). Her blood pressure is 126/70 and her pulse is 92. Her respiration  is 20 and oxygen saturation is 100%.   Wt Readings from Last 3 Encounters:  12/28/18 223 lb 1.9 oz (101.2 kg)  10/25/18 223 lb 8 oz (101.4 kg)  06/24/18 217 lb (98.4 kg)    Physical Exam Vitals signs reviewed.  HENT:     Head: Normocephalic and atraumatic.  Eyes:     Pupils: Pupils are equal, round, and reactive to light.  Neck:     Musculoskeletal: Normal range of motion.  Cardiovascular:     Rate and Rhythm: Normal rate and regular rhythm.     Heart sounds: Normal heart sounds.  Pulmonary:     Effort: Pulmonary effort is normal.     Breath sounds: Normal breath sounds.  Abdominal:     General: Bowel sounds are normal.     Palpations: Abdomen is soft.  Musculoskeletal: Normal range of motion.         General: No tenderness or deformity.  Lymphadenopathy:     Cervical: No cervical adenopathy.  Skin:    General: Skin is warm and dry.     Findings: No erythema or rash.  Neurological:     Mental Status: She is alert and oriented to person, place, and time.  Psychiatric:        Behavior: Behavior normal.        Thought Content: Thought content normal.        Judgment: Judgment normal.      Lab Results  Component Value Date   WBC 6.8 12/28/2018   HGB 13.1 12/28/2018   HCT 39.2 12/28/2018   MCV 92.0 12/28/2018   PLT 227 12/28/2018     Chemistry      Component Value Date/Time   NA 136 12/28/2018 1509   NA 137 06/24/2018 1445   K 4.2 12/28/2018 1509   CL 103 12/28/2018 1509   CO2 26 12/28/2018 1509   BUN 13 12/28/2018 1509   BUN 10 06/24/2018 1445   CREATININE 0.78 12/28/2018 1509   CREATININE 0.63 11/24/2016 0956      Component Value Date/Time   CALCIUM 8.9 12/28/2018 1509   ALKPHOS 98 12/28/2018 1509   AST 22 12/28/2018 1509   ALT 17 12/28/2018 1509   BILITOT 0.7 12/28/2018 1509      Impression and Plan: Lacey Lee is a 43 year old African-American female.  She has iron deficiency anemia.  She probably has some blood loss.  There may be some issues with malabsorption.  Her hemoglobin continues to improve.  Her MCV keeps getting better.  Again, I would have to think that her iron levels should be okay.  I really think we can get her back in 3 months now.  I told her that she can always come back sooner if she is having any issues.  I am just happy that she is doing well.  I know that it is little bit unusual for her not to be working full-time during this coronavirus pandemic.   Volanda Napoleon, MD 4/1/20203:57 PM

## 2018-12-29 LAB — IRON AND TIBC
Iron: 37 ug/dL — ABNORMAL LOW (ref 41–142)
Saturation Ratios: 17 % — ABNORMAL LOW (ref 21–57)
TIBC: 221 ug/dL — ABNORMAL LOW (ref 236–444)
UIBC: 185 ug/dL (ref 120–384)

## 2018-12-29 LAB — FERRITIN: Ferritin: 206 ng/mL (ref 11–307)

## 2019-01-04 DIAGNOSIS — M5430 Sciatica, unspecified side: Secondary | ICD-10-CM | POA: Diagnosis not present

## 2019-02-15 ENCOUNTER — Other Ambulatory Visit: Payer: Self-pay | Admitting: Obstetrics & Gynecology

## 2019-02-15 DIAGNOSIS — Z1231 Encounter for screening mammogram for malignant neoplasm of breast: Secondary | ICD-10-CM

## 2019-02-26 NOTE — Progress Notes (Signed)
[name] [place], [place] [address] Phone: [phone] Subjective:      CC: Right knee pain  [id number]  Lacey Lee is a 43 y.o. female coming in with complaint of knee pain. Last seen in 2017 for lumbar radiculopathy and greater trochanteric bursitis. Did have hip/glute pain in March 2020 that was severe. Patient states that she has pain on medial aspect of right knee. States that her foot can also hurt. Feels a pulling sensation when she is doing squats. Does have dull ache with rest. Has been using IBU for pain.      Past Medical History:  Diagnosis Date  . Anxiety    slight  . Asthma    albuterol rescue inhaler-uses prn, exacerbated by pet dander  . History of ectopic pregnancy 1999   S/P RIGHT SALPINGECTOMY  . History of herpes simplex type 2 infection 2005  . History of uterine fibroid   . Neurofibromatosis [facility])    dx is child--  nonmalignant  . Pneumonia    years ago  . Seasonal allergies    Past Surgical History:  Procedure Laterality Date  . CYSTOSCOPY N/A 05/11/2017   Procedure: CYSTOSCOPY;  Surgeon: Megan Salon, MD;  Location: [facility];  Service: Gynecology;  Laterality: N/A;  . DILATATION & CURETTAGE/HYSTEROSCOPY WITH MYOSURE N/A 08/07/2016   Procedure: HYSTEROSCOPY WITH MYOSURE;  Surgeon: Megan Salon, MD;  Location: [facility];  Service: Gynecology;  Laterality: N/A;  Fibroid resection  . IUD REMOVAL N/A 08/07/2016   Procedure: INTRAUTERINE DEVICE (IUD) REMOVAL;  Surgeon: Megan Salon, MD;  Location: [facility];  Service: Gynecology;  Laterality: N/A;  . IUD REMOVAL N/A 05/11/2017   Procedure: INTRAUTERINE DEVICE (IUD) REMOVAL;  Surgeon: Megan Salon, MD;  Location: [facility];  Service: Gynecology;  Laterality: N/A;  . LAPAROSCOPIC LYSIS OF ADHESIONS  05/11/2017   Procedure: LAPAROSCOPIC LYSIS OF ADHESIONS;  Surgeon: Megan Salon, MD;  Location: [facility];  Service:  Gynecology;;  with cautery of endometroisis  . LAPAROSCOPY FOR ECTOPIC PREGNANCY  2000   RIGHT SALPINGECTOMY  . ROBOT ASSISTED MYOMECTOMY N/A 12/16/2012   Procedure: ROBOTIC ASSISTED MYOMECTOMY;  Surgeon: Governor Specking, MD;  Location: [facility];  Service: Gynecology;  Laterality: N/A;  . ROBOTIC ASSISTED LAPAROSCOPIC LYSIS OF ADHESION  12/16/2012   Procedure: ROBOTIC ASSISTED LAPAROSCOPIC LYSIS OF ADHESION;  Surgeon: Governor Specking, MD;  Location: [facility];  Service: Gynecology;;  with excision of perotoneal lesions  . TOTAL LAPAROSCOPIC HYSTERECTOMY WITH SALPINGECTOMY Bilateral 05/11/2017   Procedure: HYSTERECTOMY TOTAL LAPAROSCOPIC WITH LEFT SALPINGECTOMY;  Surgeon: Megan Salon, MD;  Location: [facility];  Service: Gynecology;  Laterality: Bilateral;  . WISDOM TOOTH EXTRACTION     Social History   Socioeconomic History  . Marital status: Single    Spouse name: Not on file  . Number of children: 0  . Years of education: Not on file  . Highest education level: Not on file  Occupational History  . Occupation: Engineer, mining at [employer]: [place]  . Financial resource strain: Not on file  . Food insecurity:    Worry: Not on file    Inability: Not on file  . Transportation needs:    Medical: Not on file    Non-medical: Not on file  Tobacco Use  . Smoking status: Never Smoker  . Smokeless tobacco: Never Used  Substance and Sexual Activity  .  Alcohol use: Yes    Alcohol/week: 0.0 standard drinks    Comment: OCCASIONAL  . Drug use: No  . Sexual activity: Yes    Partners: Male    Birth control/protection: Surgical    Comment: [place] 05/11/17   Lifestyle  . Physical activity:    Days per week: Not on file    Minutes per session: Not on file  . Stress: Not on file  Relationships  . Social connections:    Talks on phone: Not on file    Gets together: Not on file    Attends religious service: Not on file    Active member of club or organization:  Not on file    Attends meetings of clubs or organizations: Not on file    Relationship status: Not on file  Other Topics Concern  . Not on file  Social History Narrative   Lives at home with her sister   Right handed   Caffeine: 6 cups daily   Allergies  Allergen Reactions  . Latex Other (See Comments)    DERMITITIS  . Other Hives and Swelling    PORK (alphagal)-lip/tongue swelling, hives  . Tramadol Nausea And Vomiting   Family History  Adopted: Yes  Problem Relation Age of Onset  . Allergies Mother        food  . Hypertension Mother   . Hyperlipidemia Mother   . Heart failure Mother   . Thyroid disease Mother   . Diabetes Mother   . Lung cancer Father   . Diabetes Sister   . Hypertension Sister   . Renal Disease Sister        dialysis  . Uterine cancer Paternal Grandmother   . Breast cancer Neg Hx      Current Outpatient Medications (Cardiovascular):  Marland Kitchen  EPIPEN 2-PAK 0.3 MG/0.3ML DEVI, 0.3 mg as needed (environmental allergies).   Current Outpatient Medications (Respiratory):  .  albuterol (PROVENTIL HFA;VENTOLIN HFA) 108 (90 BASE) MCG/ACT inhaler, Inhale 2 puffs into the lungs every 6 (six) hours as needed for wheezing or shortness of breath.  .  cetirizine (ZYRTEC) 10 MG tablet, Take 10 mg by mouth daily. .  fluticasone (FLONASE) 50 MCG/ACT nasal spray, Place 1 spray into both nostrils daily.  Current Outpatient Medications (Analgesics):  .  ibuprofen (ADVIL,MOTRIN) 800 MG tablet, Take 1 tablet (800 mg total) by mouth every 8 (eight) hours as needed. (Patient taking differently: Take 800 mg by mouth every 8 (eight) hours as needed for moderate pain. ) .  meloxicam (MOBIC) 7.5 MG tablet, TK 1 T PO BID  Current Outpatient Medications (Hematological):  Marland Kitchen  Ferrous Fumarate (HEMOCYTE - 106 MG FE) 324 (106 Fe) MG TABS tablet, Take 1 tablet by mouth daily.  Current Outpatient Medications (Other):  Marland Kitchen  ALPRAZolam (XANAX) 0.25 MG tablet, Take 0.25 mg by mouth daily as  needed for anxiety.  .  Vitamin D, Ergocalciferol, (DRISDOL) 1.25 MG (50000 UT) CAPS capsule, Take 1 capsule (50,000 Units total) by mouth every 7 (seven) days. .  Diclofenac Sodium 2 % SOLN, Place 2 g onto the skin 2 (two) times daily.    Past medical history, social, surgical and family history all reviewed in electronic medical record.  No pertanent information unless stated regarding to the chief complaint.   Review of Systems:  No headache, visual changes, nausea, vomiting, diarrhea, constipation, dizziness, abdominal pain, skin rash, fevers, chills, night sweats, weight loss, swollen lymph nodes, body aches, joint swelling, muscle aches, chest pain,  shortness of breath, mood changes.   Objective  Blood pressure 116/84, pulse 81, height 5\' 1"  (1.549 m), weight 223 lb (101.2 kg), last menstrual period 05/07/2017, SpO2 98 %.    General: No apparent distress alert and oriented x3 mood and affect normal, dressed appropriately.  HEENT: Pupils equal, extraocular movements intact  Respiratory: Patient's speak in full sentences and does not appear short of breath  Cardiovascular: No lower extremity edema, non tender, no erythema  Skin: Warm dry intact with no signs of infection or rash on extremities or on axial skeleton.  Abdomen: Soft nontender  Neuro: Cranial nerves II through XII are intact, neurovascularly intact in all extremities with 2+ DTRs and 2+ pulses.  Lymph: No lymphadenopathy of posterior or anterior cervical chain or axillae bilaterally.  Gait normal with good balance and coordination.  MSK:  Non tender with full range of motion and good stability and symmetric strength and tone of shoulders, elbows, wrist, hip, and ankles bilaterally.  Right knee exam shows the patient does have mild to moderate tenderness to palpation.  Patient does have positive crepitus.  Positive grind test. Limited musculoskeletal ultrasound was performed and interpreted by Lyndal Pulley  Limited  ultrasound shows the patient does have narrowing of the patellofemoral joint laterally as well as the medial joint space.  No significant change in the meniscus.  No significant joint swelling  [id number]; 15 additional minutes spent for Therapeutic exercises as stated in above notes.  This included exercises focusing on stretching, strengthening, with significant focus on eccentric aspects.   Long term goals include an improvement in range of motion, strength, endurance as well as avoiding reinjury. Patient's frequency would include in 1-2 times a day, 3-5 times a week for a duration of 6-12 weeks. Reviewed anatomy using anatomical model and how PFS occurs.  Given rehab exercises handout for VMO, hip abductors, core, entire kinetic chain including proprioception exercises.  Could benefit from PT, regular exercise, upright biking, and a PFS knee brace to assist with tracking abnormalities.  Proper technique shown and discussed handout in great detail with ATC.  All questions were discussed and answered.      Impression and Recommendations:     This case required medical decision making of moderate complexity. The above documentation has been reviewed and is accurate and complete Lyndal Pulley, DO       Note: This dictation was prepared with Dragon dictation along with smaller phrase technology. Any transcriptional errors that result from this process are unintentional.

## 2019-02-27 ENCOUNTER — Encounter: Payer: Self-pay | Admitting: Family Medicine

## 2019-02-27 ENCOUNTER — Ambulatory Visit: Payer: Self-pay

## 2019-02-27 ENCOUNTER — Ambulatory Visit (INDEPENDENT_AMBULATORY_CARE_PROVIDER_SITE_OTHER): Payer: BLUE CROSS/BLUE SHIELD | Admitting: Family Medicine

## 2019-02-27 VITALS — BP 116/84 | HR 81 | Ht 61.0 in | Wt 223.0 lb

## 2019-02-27 DIAGNOSIS — M25561 Pain in right knee: Secondary | ICD-10-CM

## 2019-02-27 DIAGNOSIS — G8929 Other chronic pain: Secondary | ICD-10-CM

## 2019-02-27 DIAGNOSIS — M1711 Unilateral primary osteoarthritis, right knee: Secondary | ICD-10-CM | POA: Diagnosis not present

## 2019-02-27 MED ORDER — DICLOFENAC SODIUM 2 % TD SOLN: 2.0000 g | Freq: Two times a day (BID) | TRANSDERMAL | 3 refills | Status: DC

## 2019-02-27 NOTE — Assessment & Plan Note (Signed)
Patellofemoral arthritis.  Discussed icing, home exercise, topical anti-inflammatories and bracing.  Worsening pain consider injection follow-up again in 4 to 8 weeks

## 2019-02-27 NOTE — Patient Instructions (Signed)
Good to see you  Ice 20 minutes 2 times daily. Usually after activity and before bed. Exercises 3 times a week.  pennsaid pinkie amount topically 2 times daily as needed.  Brace daily for 2 weeks then with exercise for another 1-2 months.  See me again in 4ish weeks.  Be safe

## 2019-03-27 ENCOUNTER — Ambulatory Visit (INDEPENDENT_AMBULATORY_CARE_PROVIDER_SITE_OTHER)
Admission: RE | Admit: 2019-03-27 | Discharge: 2019-03-27 | Disposition: A | Discharge status: Home / Self Care | Payer: BC Managed Care – PPO | Source: Ambulatory Visit | Attending: Family Medicine | Admitting: Family Medicine

## 2019-03-27 ENCOUNTER — Ambulatory Visit: Payer: BC Managed Care – PPO | Admitting: Family Medicine

## 2019-03-27 ENCOUNTER — Encounter: Payer: Self-pay | Admitting: Family Medicine

## 2019-03-27 ENCOUNTER — Ambulatory Visit
Admission: RE | Admit: 2019-03-27 | Discharge: 2019-03-27 | Disposition: A | Discharge status: Home / Self Care | Payer: BC Managed Care – PPO | Source: Ambulatory Visit | Attending: Obstetrics & Gynecology | Admitting: Obstetrics & Gynecology

## 2019-03-27 ENCOUNTER — Other Ambulatory Visit: Payer: Self-pay

## 2019-03-27 VITALS — BP 104/66 | HR 110 | Ht 61.0 in | Wt 210.0 lb

## 2019-03-27 DIAGNOSIS — M1711 Unilateral primary osteoarthritis, right knee: Secondary | ICD-10-CM

## 2019-03-27 DIAGNOSIS — Z1231 Encounter for screening mammogram for malignant neoplasm of breast: Secondary | ICD-10-CM

## 2019-03-27 DIAGNOSIS — G8929 Other chronic pain: Secondary | ICD-10-CM

## 2019-03-27 DIAGNOSIS — M25561 Pain in right knee: Secondary | ICD-10-CM | POA: Diagnosis not present

## 2019-03-27 NOTE — Progress Notes (Signed)
[facility] [place], [place] [address] Phone: [phone] Subjective:   Fontaine No, am serving as a scribe for Dr. Hulan Saas.  I'm seeing this patient by the request  of:    CC: Knee pain follow-up  [id number]   02/27/2019 Patellofemoral arthritis.  Discussed icing, home exercise, topical anti-inflammatories and bracing.  Worsening pain consider injection follow-up again in 4 to 8 weeks  Update 03/27/2019 Lacey Lee is a 43 y.o. female coming in with complaint of right knee pain. Patient states that she has been walking. Pain during and after activity. Pain has not improved.  Patient states still increasing discomfort especially with going up or down stairs.  Patient states increasing instability as well.  Feels like her knee wants to give out on her on a regular basis now.  Patient denies of that she has fallen at any point.     Past Medical History:  Diagnosis Date  . Anxiety    slight  . Asthma    albuterol rescue inhaler-uses prn, exacerbated by pet dander  . History of ectopic pregnancy 1999   S/P RIGHT SALPINGECTOMY  . History of herpes simplex type 2 infection 2005  . History of uterine fibroid   . Neurofibromatosis [facility])    dx is child--  nonmalignant  . Pneumonia    years ago  . Seasonal allergies    Past Surgical History:  Procedure Laterality Date  . CYSTOSCOPY N/A 05/11/2017   Procedure: CYSTOSCOPY;  Surgeon: Megan Salon, MD;  Location: [facility];  Service: Gynecology;  Laterality: N/A;  . DILATATION & CURETTAGE/HYSTEROSCOPY WITH MYOSURE N/A 08/07/2016   Procedure: HYSTEROSCOPY WITH MYOSURE;  Surgeon: Megan Salon, MD;  Location: [facility];  Service: Gynecology;  Laterality: N/A;  Fibroid resection  . IUD REMOVAL N/A 08/07/2016   Procedure: INTRAUTERINE DEVICE (IUD) REMOVAL;  Surgeon: Megan Salon, MD;  Location: [facility];  Service: Gynecology;  Laterality: N/A;  .  IUD REMOVAL N/A 05/11/2017   Procedure: INTRAUTERINE DEVICE (IUD) REMOVAL;  Surgeon: Megan Salon, MD;  Location: [facility];  Service: Gynecology;  Laterality: N/A;  . LAPAROSCOPIC LYSIS OF ADHESIONS  05/11/2017   Procedure: LAPAROSCOPIC LYSIS OF ADHESIONS;  Surgeon: Megan Salon, MD;  Location: [facility];  Service: Gynecology;;  with cautery of endometroisis  . LAPAROSCOPY FOR ECTOPIC PREGNANCY  2000   RIGHT SALPINGECTOMY  . ROBOT ASSISTED MYOMECTOMY N/A 12/16/2012   Procedure: ROBOTIC ASSISTED MYOMECTOMY;  Surgeon: Governor Specking, MD;  Location: [facility];  Service: Gynecology;  Laterality: N/A;  . ROBOTIC ASSISTED LAPAROSCOPIC LYSIS OF ADHESION  12/16/2012   Procedure: ROBOTIC ASSISTED LAPAROSCOPIC LYSIS OF ADHESION;  Surgeon: Governor Specking, MD;  Location: [facility];  Service: Gynecology;;  with excision of perotoneal lesions  . TOTAL LAPAROSCOPIC HYSTERECTOMY WITH SALPINGECTOMY Bilateral 05/11/2017   Procedure: HYSTERECTOMY TOTAL LAPAROSCOPIC WITH LEFT SALPINGECTOMY;  Surgeon: Megan Salon, MD;  Location: [facility];  Service: Gynecology;  Laterality: Bilateral;  . WISDOM TOOTH EXTRACTION     Social History   Socioeconomic History  . Marital status: Single    Spouse name: Not on file  . Number of children: 0  . Years of education: Not on file  . Highest education level: Not on file  Occupational History  . 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 Financial resource strain: Not on file  . Food insecurity  Worry: Not on file    Inability: Not on file  . Transportation needs    Medical: Not on file    Non-medical: Not on file  Tobacco Use  . Smoking status: Never Smoker  . Smokeless tobacco: Never Used  Substance and Sexual Activity  . Alcohol use: Yes    Alcohol/week: 0.0 standard drinks    Comment: OCCASIONAL  . Drug use: No  . Sexual activity: Yes    Partners: Male    Birth control/protection: Surgical    Comment: [place] 05/11/17    Lifestyle  . Physical activity    Days per week: Not on file    Minutes per session: Not on file  . Stress: Not on file  Relationships  . Social Herbalist on phone: Not on file    Gets together: Not on file    Attends religious service: Not on file    Active member of club or organization: Not on file    Attends meetings of clubs or organizations: Not on file    Relationship status: Not on file  Other Topics Concern  . Not on file  Social History Narrative   Lives at home with her sister   Right handed   Caffeine: 6 cups daily   Allergies  Allergen Reactions  . Latex Other (See Comments)    DERMITITIS  . Other Hives and Swelling    PORK (alphagal)-lip/tongue swelling, hives  . Tramadol Nausea And Vomiting   Family History  Adopted: Yes  Problem Relation Age of Onset  . Allergies Mother        food  . Hypertension Mother   . Hyperlipidemia Mother   . Heart failure Mother   . Thyroid disease Mother   . Diabetes Mother   . Lung cancer Father   . Diabetes Sister   . Hypertension Sister   . Renal Disease Sister        dialysis  . Uterine cancer Paternal Grandmother   . Breast cancer Neg Hx      Current Outpatient Medications (Cardiovascular):  Marland Kitchen  EPIPEN 2-PAK 0.3 MG/0.3ML DEVI, 0.3 mg as needed (environmental allergies).   Current Outpatient Medications (Respiratory):  .  albuterol (PROVENTIL HFA;VENTOLIN HFA) 108 (90 BASE) MCG/ACT inhaler, Inhale 2 puffs into the lungs every 6 (six) hours as needed for wheezing or shortness of breath.  .  cetirizine (ZYRTEC) 10 MG tablet, Take 10 mg by mouth daily. .  fluticasone (FLONASE) 50 MCG/ACT nasal spray, Place 1 spray into both nostrils daily.  Current Outpatient Medications (Analgesics):  .  ibuprofen (ADVIL,MOTRIN) 800 MG tablet, Take 1 tablet (800 mg total) by mouth every 8 (eight) hours as needed. (Patient taking differently: Take 800 mg by mouth every 8 (eight) hours as needed for moderate pain. ) .   meloxicam (MOBIC) 7.5 MG tablet, TK 1 T PO BID  Current Outpatient Medications (Hematological):  Marland Kitchen  Ferrous Fumarate (HEMOCYTE - 106 MG FE) 324 (106 Fe) MG TABS tablet, Take 1 tablet by mouth daily.  Current Outpatient Medications (Other):  Marland Kitchen  ALPRAZolam (XANAX) 0.25 MG tablet, Take 0.25 mg by mouth daily as needed for anxiety.  .  Diclofenac Sodium 2 % SOLN, Place 2 g onto the skin 2 (two) times daily. .  Vitamin D, Ergocalciferol, (DRISDOL) 1.25 MG (50000 UT) CAPS capsule, Take 1 capsule (50,000 Units total) by mouth every 7 (seven) days.    Past medical history, social, surgical and family history all reviewed  in electronic medical record.  No pertanent information unless stated regarding to the chief complaint.   Review of Systems:  No headache, visual changes, nausea, vomiting, diarrhea, constipation, dizz iness, abdominal pain, skin rash, fevers, chills, night sweats, weight loss, swollen lymph nodes, body aches,chest pain, shortness of breath, mood changes.  Positive muscle aches, joint swelling  Objective  Blood pressure 104/66, pulse (!) 110, height 5\' 1"  (1.549 m), weight 210 lb (95.3 kg), last menstrual period 05/07/2017, SpO2 98 %.    General: No apparent distress alert and oriented x3 mood and affect normal, dressed appropriately.  HEENT: Pupils equal, extraocular movements intact  Respiratory: Patient's speak in full sentences and does not appear short of breath  Cardiovascular: No lower extremity edema, non tender, no erythema  Skin: Warm dry intact with no signs of infection or rash on extremities or on axial skeleton.  Abdomen: Soft nontender  Neuro: Cranial nerves II through XII are intact, neurovascularly intact in all extremities with 2+ DTRs and 2+ pulses.  Lymph: No lymphadenopathy of posterior or anterior cervical chain or axillae bilaterally.  Gait antalgic gait MSK:  Non tender with full range of motion and good stability and symmetric strength and tone of  shoulders, elbows, wrist, hip, and ankles bilaterally.  Right knee exam shows the patient does have a trace effusion noted.  Crepitus noted with patellar grind.  Mild varus deformity noted.  Abnormal thigh to calf ratio noted.  Limited range of motion lacking last 5 degrees of flexion and extension.  Instability noted on testing.  After informed written and verbal consent, patient was seated on exam table. Right knee was prepped with alcohol swab and utilizing anterolateral approach, patient's right knee space was injected with 4:1  marcaine 0.5%: Kenalog 40mg /dL. Patient tolerated the procedure well without immediate complications.   Impression and Recommendations:     This case required medical decision making of moderate complexity. The above documentation has been reviewed and is accurate and complete Lyndal Pulley, DO       Note: This dictation was prepared with Dragon dictation along with smaller phrase technology. Any transcriptional errors that result from this process are unintentional.

## 2019-03-27 NOTE — Patient Instructions (Signed)
Great to see you  Injected the knee today  Xray downstairs Continue wit the home exercises  Continue the brace daily for another week or 2  See me again in 3 weeks and we will discuss other options if needed.

## 2019-03-27 NOTE — Assessment & Plan Note (Signed)
Injection given today.  Due to patient's abnormal thigh to calf ratio and instability in her knee we will see if patient would do well with an OA stability brace with a Tru pull lite.  We discussed with patient about icing regimen, topical anti-inflammatories.  X-rays are pending at the moment but I do think it will tell us that patient does have tricompartmental disease.  Patient of course was to avoid any surgical intervention and encourage weight loss.  Follow-up again in 4 weeks but could be a candidate for Visco supplementation

## 2019-04-04 ENCOUNTER — Inpatient Hospital Stay: Payer: BC Managed Care – PPO

## 2019-04-04 ENCOUNTER — Other Ambulatory Visit: Payer: Self-pay

## 2019-04-04 ENCOUNTER — Inpatient Hospital Stay: Payer: BC Managed Care – PPO | Attending: Hematology & Oncology | Admitting: Family

## 2019-04-04 ENCOUNTER — Encounter: Payer: Self-pay | Admitting: Family

## 2019-04-04 DIAGNOSIS — D509 Iron deficiency anemia, unspecified: Secondary | ICD-10-CM | POA: Insufficient documentation

## 2019-04-04 DIAGNOSIS — D500 Iron deficiency anemia secondary to blood loss (chronic): Secondary | ICD-10-CM

## 2019-04-04 LAB — CBC WITH DIFFERENTIAL (CANCER CENTER ONLY)
Abs Immature Granulocytes: 0.03 10*3/uL (ref 0.00–0.07)
Basophils Absolute: 0.1 10*3/uL (ref 0.0–0.1)
Basophils Relative: 1 %
Eosinophils Absolute: 0.2 10*3/uL (ref 0.0–0.5)
Eosinophils Relative: 3 %
HCT: 39.5 % (ref 36.0–46.0)
Hemoglobin: 12.9 g/dL (ref 12.0–15.0)
Immature Granulocytes: 1 %
Lymphocytes Relative: 37 %
Lymphs Abs: 2.4 10*3/uL (ref 0.7–4.0)
MCH: 30.6 pg (ref 26.0–34.0)
MCHC: 32.7 g/dL (ref 30.0–36.0)
MCV: 93.8 fL (ref 80.0–100.0)
Monocytes Absolute: 0.6 10*3/uL (ref 0.1–1.0)
Monocytes Relative: 9 %
Neutro Abs: 3.2 10*3/uL (ref 1.7–7.7)
Neutrophils Relative %: 49 %
Platelet Count: 215 10*3/uL (ref 150–400)
RBC: 4.21 MIL/uL (ref 3.87–5.11)
RDW: 12.6 % (ref 11.5–15.5)
WBC Count: 6.5 10*3/uL (ref 4.0–10.5)
nRBC: 0 % (ref 0.0–0.2)

## 2019-04-04 LAB — CMP (CANCER CENTER ONLY)
ALT: 12 U/L (ref 0–44)
AST: 15 U/L (ref 15–41)
Albumin: 4.2 g/dL (ref 3.5–5.0)
Alkaline Phosphatase: 79 U/L (ref 38–126)
Anion gap: 8 (ref 5–15)
BUN: 11 mg/dL (ref 6–20)
CO2: 26 mmol/L (ref 22–32)
Calcium: 8.5 mg/dL — ABNORMAL LOW (ref 8.9–10.3)
Chloride: 104 mmol/L (ref 98–111)
Creatinine: 0.58 mg/dL (ref 0.44–1.00)
GFR, Est AFR Am: >60 mL/min (ref >60)
GFR, Estimated: >60 mL/min (ref >60)
Glucose, Bld: 85 mg/dL (ref 70–99)
Potassium: 3.9 mmol/L (ref 3.5–5.1)
Sodium: 138 mmol/L (ref 135–145)
Total Bilirubin: 0.6 mg/dL (ref 0.3–1.2)
Total Protein: 7 g/dL (ref 6.5–8.1)

## 2019-04-04 LAB — RETICULOCYTES
Immature Retic Fract: 9.4 % (ref 2.3–15.9)
RBC.: 4.18 MIL/uL (ref 3.87–5.11)
Retic Count, Absolute: 50.6 10*3/uL (ref 19.0–186.0)
Retic Ct Pct: 1.2 % (ref 0.4–3.1)

## 2019-04-04 NOTE — Progress Notes (Signed)
Hematology and Oncology Follow Up Visit  Lacey Lee [id number] 04/15/1976 43 y.o. 04/04/2019   Principle Diagnosis:  Iron deficiency anemia  Current Therapy:   IV iron as indicated   Interim History:  Ms. Lacey Lee is here today for follow-up. She is doing well but has noted fatigue and had one recent episode of palpitations lasting only a few moments.  She is taking a One A Day vitamin with iron.  She has had no issue with bleeding, no bruising or petechiae.  She has had no fever, chills, n/v, cough, rash, dizziness, SOB, chest pain, abdominal pain or changes in bowel or bladder habits.  No swelling, tenderness, numbness or tingling in her extremities.  She is eating healthier with a low carb diet. She is hydrating well and her weight is stable.   ECOG Performance Status: 1 - Symptomatic but completely ambulatory  Medications:  Allergies as of 04/04/2019      Reactions   Latex Other (See Comments)   DERMITITIS   Other Hives, Swelling   PORK (alphagal)-lip/tongue swelling, hives   Tramadol Nausea And Vomiting      Medication List       Accurate as of April 04, 2019  3:20 PM. If you have any questions, ask your nurse or doctor.        albuterol 108 (90 Base) MCG/ACT inhaler Commonly known as: VENTOLIN HFA Inhale 2 puffs into the lungs every 6 (six) hours as needed for wheezing or shortness of breath.   ALPRAZolam 0.25 MG tablet Commonly known as: XANAX Take 0.25 mg by mouth daily as needed for anxiety.   cetirizine 10 MG tablet Commonly known as: ZYRTEC Take 10 mg by mouth daily.   Diclofenac Sodium 2 % Soln Place 2 g onto the skin 2 (two) times daily.   EpiPen 2-Pak 0.3 mg/0.3 mL Soaj injection Generic drug: EPINEPHrine 0.3 mg as needed (environmental allergies).   Ferrous Fumarate 324 (106 Fe) MG Tabs tablet Commonly known as: HEMOCYTE - 106 mg FE Take 1 tablet by mouth daily.   fluticasone 50 MCG/ACT nasal spray Commonly known as: FLONASE Place 1  spray into both nostrils daily.   ibuprofen 800 MG tablet Commonly known as: ADVIL Take 1 tablet (800 mg total) by mouth every 8 (eight) hours as needed. What changed: reasons to take this   meloxicam 7.5 MG tablet Commonly known as: MOBIC TK 1 T PO BID   Vitamin D (Ergocalciferol) 1.25 MG (50000 UT) Caps capsule Commonly known as: DRISDOL Take 1 capsule (50,000 Units total) by mouth every 7 (seven) days.       Allergies:  Allergies  Allergen Reactions  . Latex Other (See Comments)    DERMITITIS  . Other Hives and Swelling    PORK (alphagal)-lip/tongue swelling, hives  . Tramadol Nausea And Vomiting    Past Medical History, Surgical history, Social history, and Family History were reviewed and updated.  Review of Systems: All other 10 point review of systems is negative.   Physical Exam:  vitals were not taken for this visit.   Wt Readings from Last 3 Encounters:  03/27/19 210 lb (95.3 kg)  02/27/19 223 lb (101.2 kg)  12/28/18 223 lb 1.9 oz (101.2 kg)    Ocular: Sclerae unicteric, pupils equal, round and reactive to light Ear-nose-throat: Oropharynx clear, dentition fair Lymphatic: No cervical or supraclavicular adenopathy Lungs no rales or rhonchi, good excursion bilaterally Heart regular rate and rhythm, no murmur appreciated Abd soft, nontender, positive bowel sounds,  no liver or spleen tip palpated on exam, no fluid wave  MSK no focal spinal tenderness, no joint edema Neuro: non-focal, well-oriented, appropriate affect Breasts: Deferred   Lab Results  Component Value Date   WBC 6.5 04/04/2019   HGB 12.9 04/04/2019   HCT 39.5 04/04/2019   MCV 93.8 04/04/2019   PLT 215 04/04/2019   Lab Results  Component Value Date   FERRITIN 206 12/28/2018   IRON 37 (L) 12/28/2018   TIBC 221 (L) 12/28/2018   UIBC 185 12/28/2018   IRONPCTSAT 17 (L) 12/28/2018   Lab Results  Component Value Date   RETICCTPCT 1.2 04/04/2019   RBC 4.21 04/04/2019   RBC 4.18  04/04/2019   No results found for: KPAFRELGTCHN, LAMBDASER, KAPLAMBRATIO No results found for: IGGSERUM, IGA, IGMSERUM No results found for: Odetta Pink, SPEI   Chemistry      Component Value Date/Time   NA 136 12/28/2018 1509   NA 137 06/24/2018 1445   K 4.2 12/28/2018 1509   CL 103 12/28/2018 1509   CO2 26 12/28/2018 1509   BUN 13 12/28/2018 1509   BUN 10 06/24/2018 1445   CREATININE 0.78 12/28/2018 1509   CREATININE 0.63 11/24/2016 0956      Component Value Date/Time   CALCIUM 8.9 12/28/2018 1509   ALKPHOS 98 12/28/2018 1509   AST 22 12/28/2018 1509   ALT 17 12/28/2018 1509   BILITOT 0.7 12/28/2018 1509       Impression and Plan: Ms. Lacey Lee is a very pleasant 43 yo African American female with iron deficiency secondary to malabsorption.  We will see what her iron studies show and bring her back in for infusion if needed.  We will go ahead and plan to see her back in another 4 months.  She will contact our office with any questions or concerns. We can certainly see her sooner if needed.   [name], NP 7/7/20203:20 PM

## 2019-04-05 DIAGNOSIS — M1711 Unilateral primary osteoarthritis, right knee: Secondary | ICD-10-CM | POA: Diagnosis not present

## 2019-04-05 DIAGNOSIS — M25561 Pain in right knee: Secondary | ICD-10-CM | POA: Diagnosis not present

## 2019-04-05 LAB — IRON AND TIBC
Iron: 64 ug/dL (ref 41–142)
Saturation Ratios: 26 % (ref 21–57)
TIBC: 245 ug/dL (ref 236–444)
UIBC: 181 ug/dL (ref 120–384)

## 2019-04-05 LAB — FERRITIN: Ferritin: 102 ng/mL (ref 11–307)

## 2019-04-10 ENCOUNTER — Telehealth: Payer: Self-pay | Admitting: Hematology & Oncology

## 2019-04-10 NOTE — Telephone Encounter (Signed)
LMVM with appt date/times per 7/7 los

## 2019-04-17 ENCOUNTER — Encounter: Payer: Self-pay | Admitting: Family Medicine

## 2019-04-17 ENCOUNTER — Other Ambulatory Visit: Payer: Self-pay

## 2019-04-17 ENCOUNTER — Ambulatory Visit: Payer: BC Managed Care – PPO | Admitting: Family Medicine

## 2019-04-17 DIAGNOSIS — M1711 Unilateral primary osteoarthritis, right knee: Secondary | ICD-10-CM

## 2019-04-17 NOTE — Progress Notes (Signed)
[name] [place] [address] [place], [place] [address] Phone: [phone] Subjective:   I Lacey Lee am serving as a Education administrator for Dr. Hulan Saas.  I'm seeing this patient by the request  of:    CC: Right-sided knee pain  ZWC:HENIDPOEUM   03/27/2019 Injection given today.  Due to patient's abnormal thigh to calf ratio and instability in her knee we will see if patient would do well with an OA stability brace with a Tru pull lite.  We discussed with patient about icing regimen, topical anti-inflammatories.  X-rays are pending at the moment but I do think it will tell us that patient does have tricompartmental disease.  Patient of course was to avoid any surgical intervention and encourage weight loss.  Follow-up again in 4 weeks but could be a candidate for Visco supplementation  04/17/2019 Lacey Lee is a 43 y.o. female coming in with complaint of right knee pain. States her knee is doing better. Pain is not as bad as it has been. Still some pain with sitting for long periods of time. Squatting is better.  Patient would state that she is feeling 50% better.  Patient is awaiting placement of the custom brace.  Patient states that the sharp pains are significantly less.  Was able to squat for the first time in months.     Past Medical History:  Diagnosis Date  . Anxiety    slight  . Asthma    albuterol rescue inhaler-uses prn, exacerbated by pet dander  . History of ectopic pregnancy 1999   S/P RIGHT SALPINGECTOMY  . History of herpes simplex type 2 infection 2005  . History of uterine fibroid   . Neurofibromatosis [facility])    dx is child--  nonmalignant  . Pneumonia    years ago  . Seasonal allergies    Past Surgical History:  Procedure Laterality Date  . CYSTOSCOPY N/A 05/11/2017   Procedure: CYSTOSCOPY;  Surgeon: Megan Salon, MD;  Location: [facility];  Service: Gynecology;  Laterality: N/A;  . DILATATION & CURETTAGE/HYSTEROSCOPY WITH  MYOSURE N/A 08/07/2016   Procedure: HYSTEROSCOPY WITH MYOSURE;  Surgeon: Megan Salon, MD;  Location: [facility];  Service: Gynecology;  Laterality: N/A;  Fibroid resection  . IUD REMOVAL N/A 08/07/2016   Procedure: INTRAUTERINE DEVICE (IUD) REMOVAL;  Surgeon: Megan Salon, MD;  Location: [facility];  Service: Gynecology;  Laterality: N/A;  . IUD REMOVAL N/A 05/11/2017   Procedure: INTRAUTERINE DEVICE (IUD) REMOVAL;  Surgeon: Megan Salon, MD;  Location: [facility];  Service: Gynecology;  Laterality: N/A;  . LAPAROSCOPIC LYSIS OF ADHESIONS  05/11/2017   Procedure: LAPAROSCOPIC LYSIS OF ADHESIONS;  Surgeon: Megan Salon, MD;  Location: [facility];  Service: Gynecology;;  with cautery of endometroisis  . LAPAROSCOPY FOR ECTOPIC PREGNANCY  2000   RIGHT SALPINGECTOMY  . ROBOT ASSISTED MYOMECTOMY N/A 12/16/2012   Procedure: ROBOTIC ASSISTED MYOMECTOMY;  Surgeon: Governor Specking, MD;  Location: [facility];  Service: Gynecology;  Laterality: N/A;  . ROBOTIC ASSISTED LAPAROSCOPIC LYSIS OF ADHESION  12/16/2012   Procedure: ROBOTIC ASSISTED LAPAROSCOPIC LYSIS OF ADHESION;  Surgeon: Governor Specking, MD;  Location: [facility];  Service: Gynecology;;  with excision of perotoneal lesions  . TOTAL LAPAROSCOPIC HYSTERECTOMY WITH SALPINGECTOMY Bilateral 05/11/2017   Procedure: HYSTERECTOMY TOTAL LAPAROSCOPIC WITH LEFT SALPINGECTOMY;  Surgeon: Megan Salon, MD;  Location: [facility];  Service: Gynecology;  Laterality: Bilateral;  . WISDOM TOOTH EXTRACTION  Social History   Socioeconomic History  . Marital status: Single    Spouse name: Not on file  . Number of children: 0  . Years of education: Not on file  . Highest education level: Not on file  Occupational History  . Occupation: Engineer, mining at [employer]: [place]  . Financial resource strain: Not on file  . Food insecurity    Worry: Not on file    Inability: Not on file  . Transportation  needs    Medical: Not on file    Non-medical: Not on file  Tobacco Use  . Smoking status: Never Smoker  . Smokeless tobacco: Never Used  Substance and Sexual Activity  . Alcohol use: Yes    Alcohol/week: 0.0 standard drinks    Comment: OCCASIONAL  . Drug use: No  . Sexual activity: Yes    Partners: Male    Birth control/protection: Surgical    Comment: [place] 05/11/17   Lifestyle  . Physical activity    Days per week: Not on file    Minutes per session: Not on file  . Stress: Not on file  Relationships  . Social Herbalist on phone: Not on file    Gets together: Not on file    Attends religious service: Not on file    Active member of club or organization: Not on file    Attends meetings of clubs or organizations: Not on file    Relationship status: Not on file  Other Topics Concern  . Not on file  Social History Narrative   Lives at home with her sister   Right handed   Caffeine: 6 cups daily   Allergies  Allergen Reactions  . Latex Other (See Comments)    DERMITITIS  . Other Hives and Swelling    PORK (alphagal)-lip/tongue swelling, hives  . Tramadol Nausea And Vomiting   Family History  Adopted: Yes  Problem Relation Age of Onset  . Allergies Mother        food  . Hypertension Mother   . Hyperlipidemia Mother   . Heart failure Mother   . Thyroid disease Mother   . Diabetes Mother   . Lung cancer Father   . Diabetes Sister   . Hypertension Sister   . Renal Disease Sister        dialysis  . Uterine cancer Paternal Grandmother   . Breast cancer Neg Hx      Current Outpatient Medications (Cardiovascular):  Marland Kitchen  EPIPEN 2-PAK 0.3 MG/0.3ML DEVI, 0.3 mg as needed (environmental allergies).   Current Outpatient Medications (Respiratory):  .  albuterol (PROVENTIL HFA;VENTOLIN HFA) 108 (90 BASE) MCG/ACT inhaler, Inhale 2 puffs into the lungs every 6 (six) hours as needed for wheezing or shortness of breath.  .  cetirizine (ZYRTEC) 10 MG tablet, Take 10  mg by mouth daily. .  fluticasone (FLONASE) 50 MCG/ACT nasal spray, Place 1 spray into both nostrils daily.  Current Outpatient Medications (Analgesics):  .  etodolac (LODINE) 400 MG tablet, TK 1 T PO BID PRF PAIN .  ibuprofen (ADVIL,MOTRIN) 800 MG tablet, Take 1 tablet (800 mg total) by mouth every 8 (eight) hours as needed. (Patient taking differently: Take 800 mg by mouth every 8 (eight) hours as needed for moderate pain. )  Current Outpatient Medications (Hematological):  Marland Kitchen  Ferrous Fumarate (HEMOCYTE - 106 MG FE) 324 (106 Fe) MG TABS tablet, Take 1 tablet by mouth daily.  Current Outpatient Medications (Other):  Marland Kitchen  ALPRAZolam (XANAX) 0.25 MG tablet, Take 0.25 mg by mouth daily as needed for anxiety.  .  Diclofenac Sodium 2 % SOLN, Place 2 g onto the skin 2 (two) times daily. .  Vitamin D, Ergocalciferol, (DRISDOL) 1.25 MG (50000 UT) CAPS capsule, Take 1 capsule (50,000 Units total) by mouth every 7 (seven) days.    Past medical history, social, surgical and family history all reviewed in electronic medical record.  No pertanent information unless stated regarding to the chief complaint.   Review of Systems:  No headache, visual changes, nausea, vomiting, diarrhea, constipation, dizziness, abdominal pain, skin rash, fevers, chills, night sweats, weight loss, swollen lymph nodes, body aches, joint swelling, muscle aches, chest pain, shortness of breath, mood changes.   Objective  Blood pressure 110/70, pulse 82, height 5\' 1"  (1.549 m), weight 211 lb (95.7 kg), last menstrual period 05/07/2017, SpO2 98 %.    General: No apparent distress alert and oriented x3 mood and affect normal, dressed appropriately.  HEENT: Pupils equal, extraocular movements intact  Respiratory: Patient's speak in full sentences and does not appear short of breath  Cardiovascular: No lower extremity edema, non tender, no erythema  Skin: Warm dry intact with no signs of infection or rash on extremities or on  axial skeleton.  Abdomen: Soft nontender  Neuro: Cranial nerves II through XII are intact, neurovascularly intact in all extremities with 2+ DTRs and 2+ pulses.  Lymph: No lymphadenopathy of posterior or anterior cervical chain or axillae bilaterally.  Gait mild antalgic MSK:  Non tender with full range of motion and good stability and symmetric strength and tone of shoulders, elbows, wrist, hip and ankles bilaterally.  Right knee exam does have a positive grind test is noted.  Mild instability with abnormal thigh to calf ratio.  Patient does have near full range of motion with the last 2 degrees of extension.  Full strength of the quad hamstring.  Neurovascular intact distally   Impression and Recommendations:     . The above documentation has been reviewed and is accurate and complete Lacey Pulley, DO       Note: This dictation was prepared with Dragon dictation along with smaller phrase technology. Any transcriptional errors that result from this process are unintentional.

## 2019-04-17 NOTE — Assessment & Plan Note (Signed)
Patient is doing fairly well with the patellofemoral arthritis.  50% better since the steroid injection.  Patient wants to hold on the viscosupplementation at this time.  Being set up for the custom brace secondary to the abnormal thigh to calf ratio and the instability.  Patient has made progress.  Follow-up with me again in 6 to 8 weeks and will consider the Visco if any worsening symptoms again.

## 2019-04-17 NOTE — Patient Instructions (Signed)
Good to see you Norm Parcel [phone] Follow up with me in 6-8 weeks for possible visco injection

## 2019-05-09 ENCOUNTER — Telehealth: Payer: Self-pay

## 2019-05-09 NOTE — Telephone Encounter (Signed)
Called and rescheduled 9/14 appointment

## 2019-06-05 IMAGING — MG 2D DIGITAL DIAGNOSTIC UNILATERAL RIGHT MAMMOGRAM WITH CAD AND AD
6 series · 6 of 14 positions shown · non-contrast
Comparison: March 17, 2017

CLINICAL DATA: 41-year-old patient recalled from recent baseline
screening mammogram for further evaluation of a possible asymmetry
in the right breast.

EXAM:
2D DIGITAL DIAGNOSTIC RIGHT MAMMOGRAM WITH CAD AND ADJUNCT TOMO
ULTRASOUND RIGHT BREAST

[R CC]
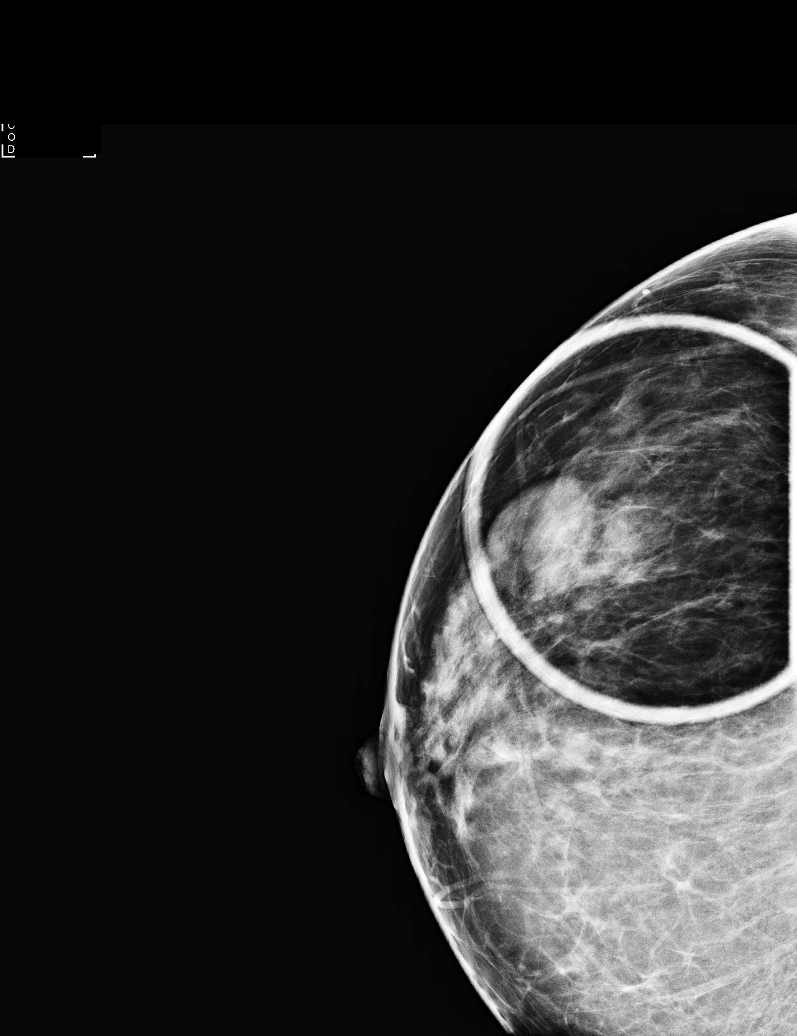

[R MLO]
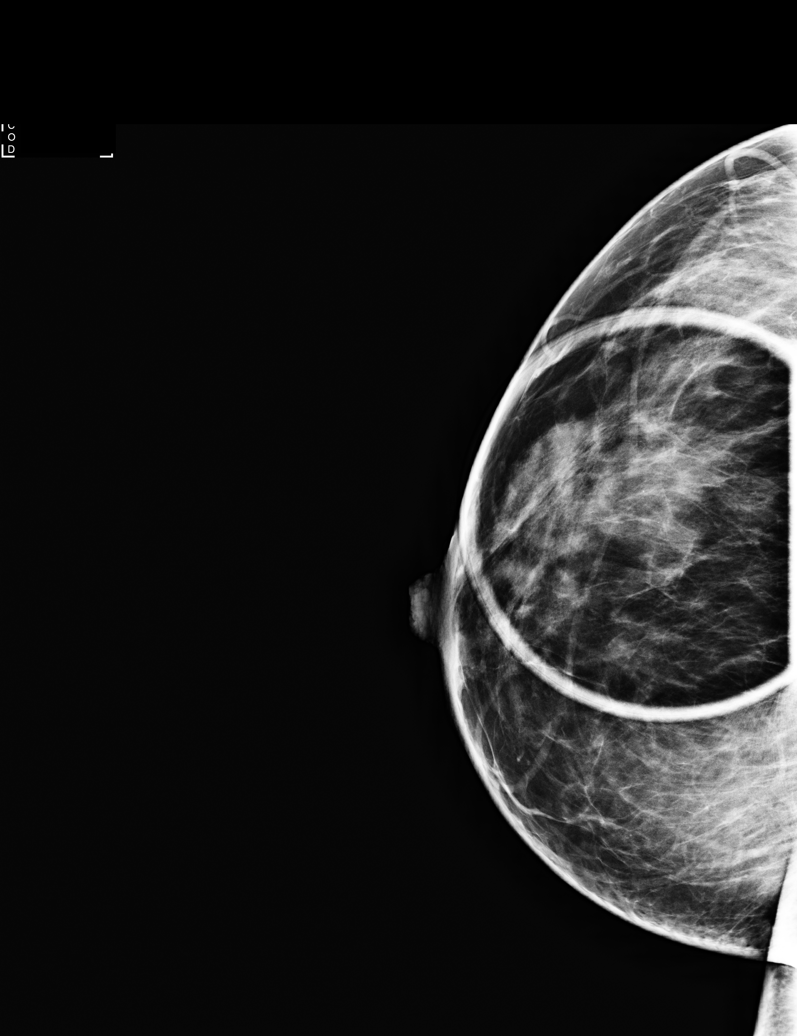

[R CC synth-2D]
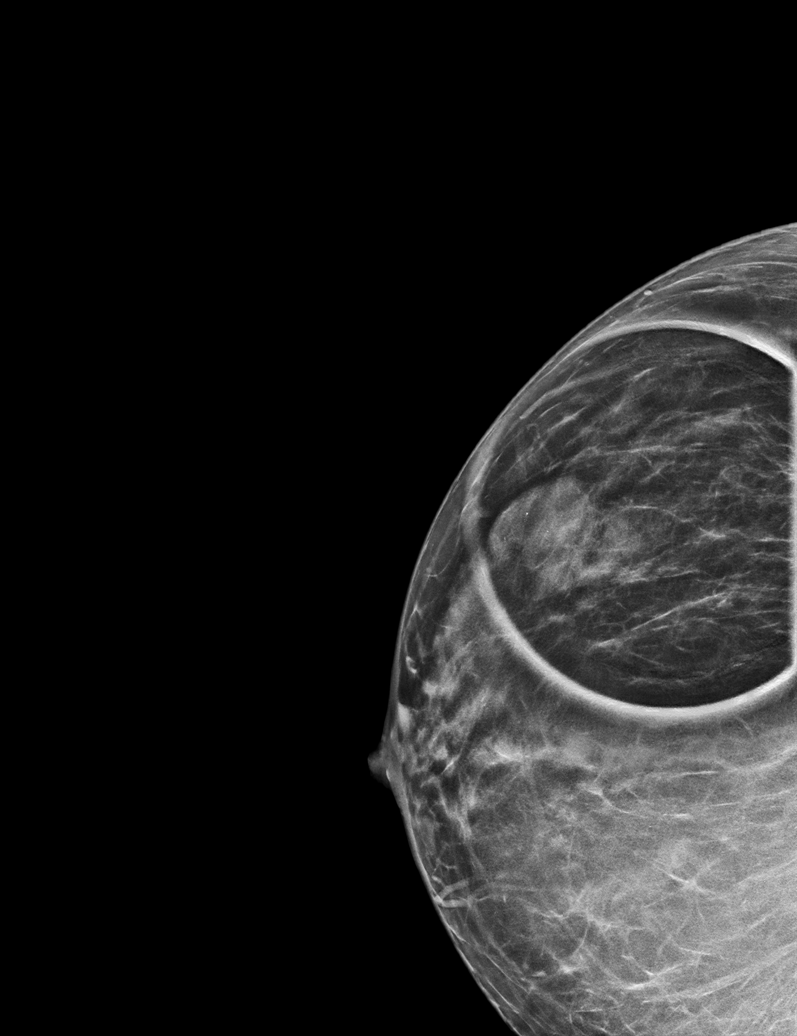

[R MLO synth-2D]
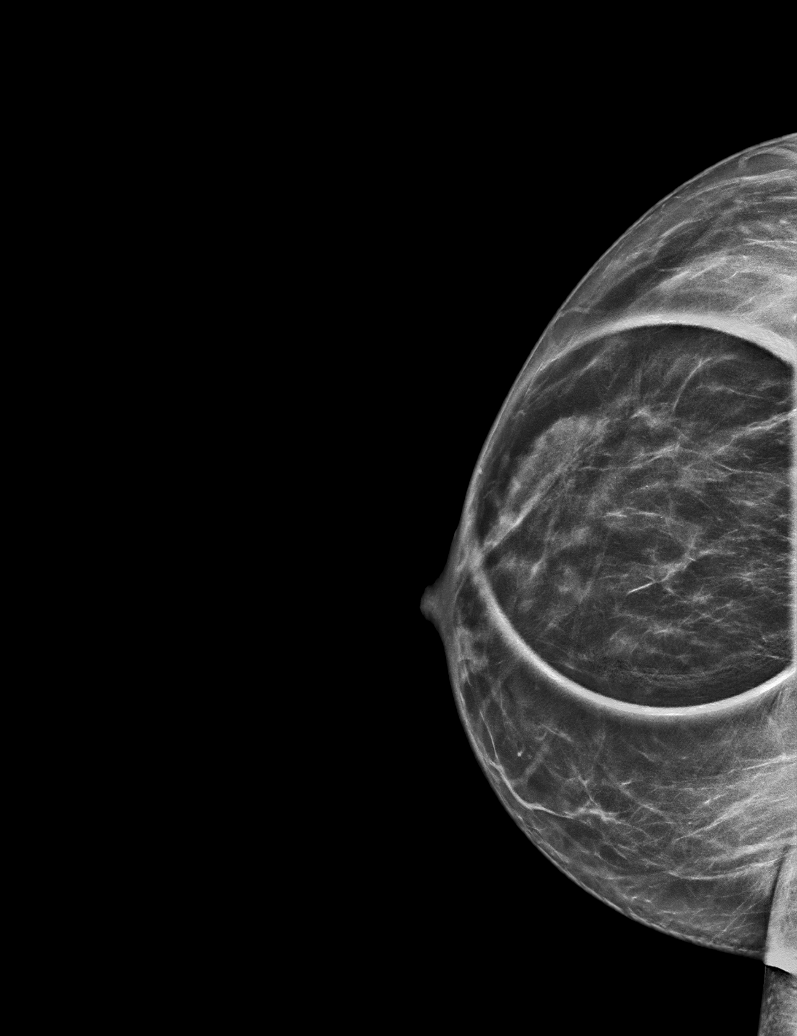

[R CC tomo · tomo slice 27/54.0]
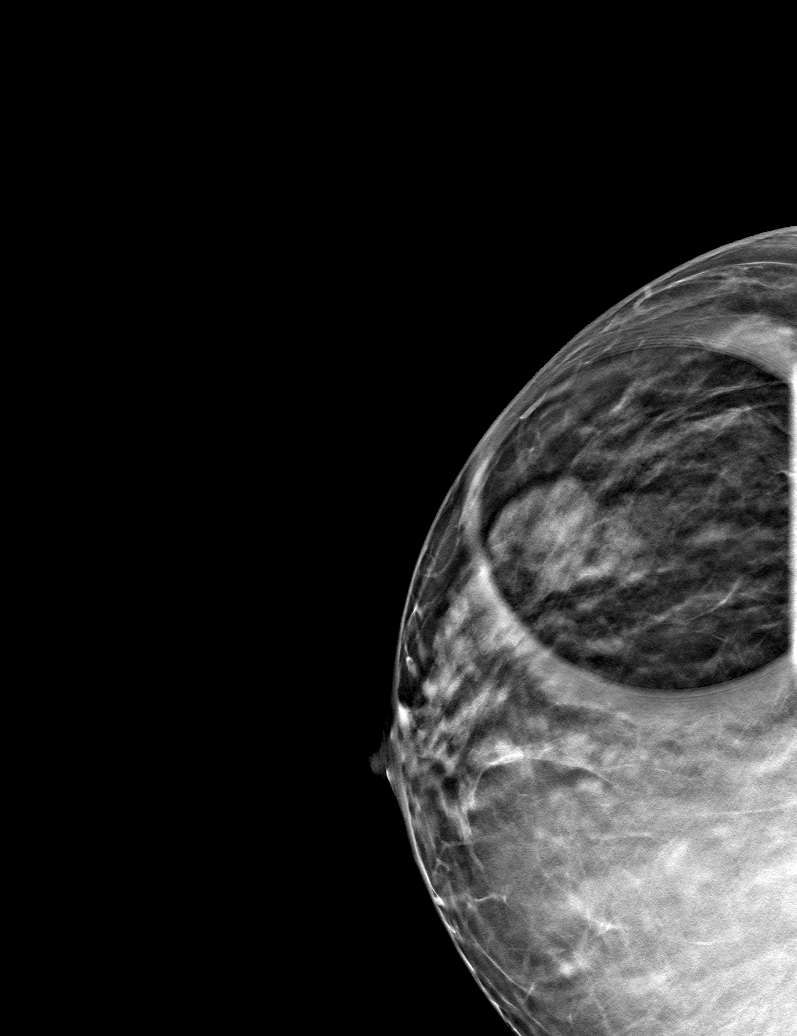

[R MLO tomo · tomo slice 31/60.0]
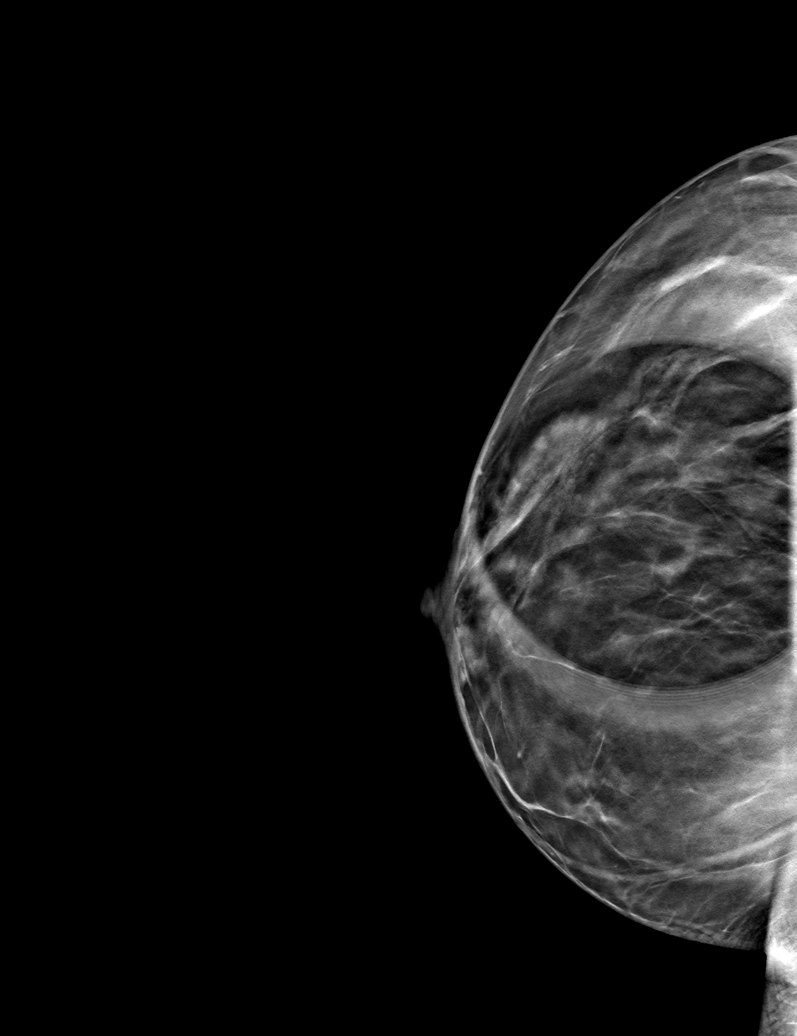

[6 of 14 positions shown; findings below may reference images not displayed]

ACR Breast Density Category b: There are scattered areas of
fibroglandular density.
FINDINGS: Focal spot compression views including tomography of the outer right
breast show a probable island of fibroglandular tissue. No
suspicious mass or architectural distortion is seen.

Mammographic images were processed with CAD.

Targeted ultrasound is performed, showing an island of benign
fibroglandular tissue corresponding in size and position to the
asymmetry seen on the screening mammogram. This island of glandular
tissue is at [DATE] position 3 cm from the nipple and incidentally
contains a tiny cyst within it. No suspicious findings.
IMPRESSION: The asymmetry identified on the recent screening mammogram
corresponds to a benign island of fibroglandular tissue. No evidence
of malignancy.

RECOMMENDATION:
Screening mammogram in one year.(Code:RC-[id number])

I have discussed the findings and recommendations with the patient.
Results were also provided in writing at the conclusion of the
visit. If applicable, a reminder letter will be sent to the patient
regarding the next appointment.

BI-RADS CATEGORY  1: Negative.

## 2019-06-11 NOTE — Progress Notes (Signed)
[facility] [place], [place] [address] Phone: [phone] Subjective:    I'm seeing this patient by the request  of:    CC: Knee pain follow-up  [id number]   04/17/2019 Patient is doing fairly well with the patellofemoral arthritis.  50% better since the steroid injection.  Patient wants to hold on the viscosupplementation at this time.  Being set up for the custom brace secondary to the abnormal thigh to calf ratio and the instability.  Patient has made progress.  Follow-up with me again in 6 to 8 weeks and will consider the Visco if any worsening symptoms again.  Update 06/13/2019 Lacey Lee is a 43 y.o. female coming in with complaint of bilateral knee pain. Continues to have pain. Did have some relief from injection. Feels now that the weather is changing that her pain is increasing. Has tingling in her lower legs with sitting which started this week. Notes that her work space is really cold and thinks this increases her pain. Also notes pain with driving. Right side is still worse than left knee.     Past Medical History:  Diagnosis Date  . Anxiety    slight  . Asthma    albuterol rescue inhaler-uses prn, exacerbated by pet dander  . History of ectopic pregnancy 1999   S/P RIGHT SALPINGECTOMY  . History of herpes simplex type 2 infection 2005  . History of uterine fibroid   . Neurofibromatosis [facility])    dx is child--  nonmalignant  . Pneumonia    years ago  . Seasonal allergies    Past Surgical History:  Procedure Laterality Date  . CYSTOSCOPY N/A 05/11/2017   Procedure: CYSTOSCOPY;  Surgeon: Megan Salon, MD;  Location: [facility];  Service: Gynecology;  Laterality: N/A;  . DILATATION & CURETTAGE/HYSTEROSCOPY WITH MYOSURE N/A 08/07/2016   Procedure: HYSTEROSCOPY WITH MYOSURE;  Surgeon: Megan Salon, MD;  Location: [facility];  Service: Gynecology;  Laterality: N/A;  Fibroid resection  . IUD REMOVAL N/A  08/07/2016   Procedure: INTRAUTERINE DEVICE (IUD) REMOVAL;  Surgeon: Megan Salon, MD;  Location: [facility];  Service: Gynecology;  Laterality: N/A;  . IUD REMOVAL N/A 05/11/2017   Procedure: INTRAUTERINE DEVICE (IUD) REMOVAL;  Surgeon: Megan Salon, MD;  Location: [facility];  Service: Gynecology;  Laterality: N/A;  . LAPAROSCOPIC LYSIS OF ADHESIONS  05/11/2017   Procedure: LAPAROSCOPIC LYSIS OF ADHESIONS;  Surgeon: Megan Salon, MD;  Location: [facility];  Service: Gynecology;;  with cautery of endometroisis  . LAPAROSCOPY FOR ECTOPIC PREGNANCY  2000   RIGHT SALPINGECTOMY  . ROBOT ASSISTED MYOMECTOMY N/A 12/16/2012   Procedure: ROBOTIC ASSISTED MYOMECTOMY;  Surgeon: Governor Specking, MD;  Location: [facility];  Service: Gynecology;  Laterality: N/A;  . ROBOTIC ASSISTED LAPAROSCOPIC LYSIS OF ADHESION  12/16/2012   Procedure: ROBOTIC ASSISTED LAPAROSCOPIC LYSIS OF ADHESION;  Surgeon: Governor Specking, MD;  Location: [facility];  Service: Gynecology;;  with excision of perotoneal lesions  . TOTAL LAPAROSCOPIC HYSTERECTOMY WITH SALPINGECTOMY Bilateral 05/11/2017   Procedure: HYSTERECTOMY TOTAL LAPAROSCOPIC WITH LEFT SALPINGECTOMY;  Surgeon: Megan Salon, MD;  Location: [facility];  Service: Gynecology;  Laterality: Bilateral;  . WISDOM TOOTH EXTRACTION     Social History   Socioeconomic History  . Marital status: Single    Spouse name: Not on file  . Number of children: 0  . Years of education: Not on file  . Highest education level: Not  on file  Occupational History  . 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 Financial resource strain: Not on file  . Food insecurity    Worry: Not on file    Inability: Not on file  . Transportation needs    Medical: Not on file    Non-medical: Not on file  Tobacco Use  . Smoking status: Never Smoker  . Smokeless tobacco: Never Used  Substance and Sexual Activity  . Alcohol use: Yes    Alcohol/week:  0.0 standard drinks    Comment: OCCASIONAL  . Drug use: No  . Sexual activity: Yes    Partners: Male    Birth control/protection: Surgical    Comment: [place] 05/11/17   Lifestyle  . Physical activity    Days per week: Not on file    Minutes per session: Not on file  . Stress: Not on file  Relationships  . Social Herbalist on phone: Not on file    Gets together: Not on file    Attends religious service: Not on file    Active member of club or organization: Not on file    Attends meetings of clubs or organizations: Not on file    Relationship status: Not on file  Other Topics Concern  . Not on file  Social History Narrative   Lives at home with her sister   Right handed   Caffeine: 6 cups daily   Allergies  Allergen Reactions  . Latex Other (See Comments)    DERMITITIS  . Other Hives and Swelling    PORK (alphagal)-lip/tongue swelling, hives  . Tramadol Nausea And Vomiting   Family History  Adopted: Yes  Problem Relation Age of Onset  . Allergies Mother        food  . Hypertension Mother   . Hyperlipidemia Mother   . Heart failure Mother   . Thyroid disease Mother   . Diabetes Mother   . Lung cancer Father   . Diabetes Sister   . Hypertension Sister   . Renal Disease Sister        dialysis  . Uterine cancer Paternal Grandmother   . Breast cancer Neg Hx      Current Outpatient Medications (Cardiovascular):  Marland Kitchen  EPIPEN 2-PAK 0.3 MG/0.3ML DEVI, 0.3 mg as needed (environmental allergies).   Current Outpatient Medications (Respiratory):  .  albuterol (PROVENTIL HFA;VENTOLIN HFA) 108 (90 BASE) MCG/ACT inhaler, Inhale 2 puffs into the lungs every 6 (six) hours as needed for wheezing or shortness of breath.  .  cetirizine (ZYRTEC) 10 MG tablet, Take 10 mg by mouth daily. .  fluticasone (FLONASE) 50 MCG/ACT nasal spray, Place 1 spray into both nostrils daily.  Current Outpatient Medications (Analgesics):  .  etodolac (LODINE) 400 MG tablet, TK 1 T PO BID PRF  PAIN .  ibuprofen (ADVIL,MOTRIN) 800 MG tablet, Take 1 tablet (800 mg total) by mouth every 8 (eight) hours as needed. (Patient taking differently: Take 800 mg by mouth every 8 (eight) hours as needed for moderate pain. )  Current Outpatient Medications (Hematological):  Marland Kitchen  Ferrous Fumarate (HEMOCYTE - 106 MG FE) 324 (106 Fe) MG TABS tablet, Take 1 tablet by mouth daily.  Current Outpatient Medications (Other):  Marland Kitchen  ALPRAZolam (XANAX) 0.25 MG tablet, Take 0.25 mg by mouth daily as needed for anxiety.  .  Diclofenac Sodium 2 % SOLN, Place 2 g onto the skin 2 (two)  times daily. Marland Kitchen  gabapentin (NEURONTIN) 100 MG capsule, Take 2 capsules (200 mg total) by mouth at bedtime. .  Vitamin D, Ergocalciferol, (DRISDOL) 1.25 MG (50000 UT) CAPS capsule, Take 1 capsule (50,000 Units total) by mouth every 7 (seven) days.    Past medical history, social, surgical and family history all reviewed in electronic medical record.  No pertanent information unless stated regarding to the chief complaint.   Review of Systems:  No headache, visual changes, nausea, vomiting, diarrhea, constipation, dizziness, abdominal pain, skin rash, fevers, chills, night sweats, weight loss, swollen lymph nodes, body aches, joint swelling, muscle aches, chest pain, shortness of breath, mood changes.   Objective  Blood pressure 120/84, pulse 78, height 5\' 1"  (1.549 m), last menstrual period 05/07/2017, SpO2 98 %.    General: No apparent distress alert and oriented x3 mood and affect normal, dressed appropriately.  HEENT: Pupils equal, extraocular movements intact  Respiratory: Patient's speak in full sentences and does not appear short of breath  Cardiovascular: No lower extremity edema, non tender, no erythema  Skin: Warm dry intact with no signs of infection or rash on extremities or on axial skeleton.  Mild fibromas noted on different portions of the soft tissue. Abdomen: Soft nontender  Neuro: Cranial nerves II through XII are  intact, neurovascularly intact in all extremities with 2+ DTRs and 2+ pulses.  Lymph: No lymphadenopathy of posterior or anterior cervical chain or axillae bilaterally.  Gait antalgic MSK:  Non tender with full range of motion and good stability and symmetric strength and tone of shoulders, elbows, wrist, hip and ankles bilaterally.  Knee exam shows patient does have arthritic changes with valgus deformity.  Abnormal thigh to calf ratio.  Patient does have near full range of motion lacking last 5 degrees of extension.  Mild instability with valgus force.  Neurovascular intact distally.  Low back exam shows the patient is nontender to palpation in the paraspinal musculature right greater than left.  Tightness with straight leg test on the right side.  Mild radicular symptoms with this at 30 degrees of forward flexion in the L5 distribution.  Deep tendon reflexes are intact.   Impression and Recommendations:     This case required medical decision making of moderate complexity. The above documentation has been reviewed and is accurate and complete Lyndal Pulley, DO       Note: This dictation was prepared with Dragon dictation along with smaller phrase technology. Any transcriptional errors that result from this process are unintentional.

## 2019-06-12 ENCOUNTER — Ambulatory Visit: Payer: BC Managed Care – PPO | Admitting: Family Medicine

## 2019-06-13 ENCOUNTER — Ambulatory Visit (INDEPENDENT_AMBULATORY_CARE_PROVIDER_SITE_OTHER): Payer: BC Managed Care – PPO | Admitting: Family Medicine

## 2019-06-13 ENCOUNTER — Other Ambulatory Visit: Payer: Self-pay

## 2019-06-13 ENCOUNTER — Ambulatory Visit (INDEPENDENT_AMBULATORY_CARE_PROVIDER_SITE_OTHER)
Admission: RE | Admit: 2019-06-13 | Discharge: 2019-06-13 | Disposition: A | Discharge status: Home / Self Care | Payer: BC Managed Care – PPO | Source: Ambulatory Visit | Attending: Family Medicine | Admitting: Family Medicine

## 2019-06-13 VITALS — BP 120/84 | HR 78 | Ht 61.0 in

## 2019-06-13 DIAGNOSIS — M545 Low back pain, unspecified: Secondary | ICD-10-CM

## 2019-06-13 DIAGNOSIS — M1711 Unilateral primary osteoarthritis, right knee: Secondary | ICD-10-CM | POA: Diagnosis not present

## 2019-06-13 DIAGNOSIS — M5416 Radiculopathy, lumbar region: Secondary | ICD-10-CM | POA: Insufficient documentation

## 2019-06-13 DIAGNOSIS — Q8500 Neurofibromatosis, unspecified: Secondary | ICD-10-CM | POA: Diagnosis not present

## 2019-06-13 MED ORDER — GABAPENTIN 100 MG PO CAPS: 200.0000 mg | ORAL_CAPSULE | Freq: Every day | ORAL | 0 refills | Status: DC

## 2019-06-13 NOTE — Assessment & Plan Note (Signed)
Mild right, history of an MRI greater than 2 years ago for evaluation of patient's neurofibromatosis.  Patient is going continue conservative therapy otherwise.  Home exercises discussed core stability.  Follow-up again in 4 to 6 weeks

## 2019-06-13 NOTE — Assessment & Plan Note (Signed)
History of neurofibromatosis.  Could be contributing to some of the tingling sensation.  Started on a low-dose of gabapentin and warned of potential side effects the patient usually does do relatively well.  Patient is to increase activity slowly.  Monitor any symptoms from the medications.  May need to consider further evaluation of patient's lumbar spine if this continues.  Patient is in agreement with the plan.  Follow-up again in 4 to 8 weeks

## 2019-06-13 NOTE — Assessment & Plan Note (Signed)
Doing relatively well at this time.  Patient wanted to hold on viscosupplementation.  Discussed icing regimen, which activities of doing which wants to avoid.  Patient will increase activity slowly.  Worsening pain can consider the viscosupplementation at a later date.

## 2019-06-13 NOTE — Patient Instructions (Signed)
Gabapentin 200 mg at night Xray downstairs Exercises 3x a week See me again in 5-6 weeks

## 2019-07-19 ENCOUNTER — Ambulatory Visit: Payer: BC Managed Care – PPO | Admitting: Family Medicine

## 2019-08-07 ENCOUNTER — Encounter: Payer: Self-pay | Admitting: Family

## 2019-08-07 ENCOUNTER — Other Ambulatory Visit: Payer: Self-pay

## 2019-08-07 ENCOUNTER — Inpatient Hospital Stay (HOSPITAL_BASED_OUTPATIENT_CLINIC_OR_DEPARTMENT_OTHER): Payer: BC Managed Care – PPO | Admitting: Family

## 2019-08-07 ENCOUNTER — Inpatient Hospital Stay: Payer: BC Managed Care – PPO | Attending: Family

## 2019-08-07 VITALS — BP 134/74 | HR 68 | Temp 98.0°F | Resp 18

## 2019-08-07 DIAGNOSIS — D500 Iron deficiency anemia secondary to blood loss (chronic): Secondary | ICD-10-CM

## 2019-08-07 DIAGNOSIS — R0602 Shortness of breath: Secondary | ICD-10-CM | POA: Insufficient documentation

## 2019-08-07 DIAGNOSIS — Z79899 Other long term (current) drug therapy: Secondary | ICD-10-CM | POA: Diagnosis not present

## 2019-08-07 DIAGNOSIS — D509 Iron deficiency anemia, unspecified: Secondary | ICD-10-CM | POA: Insufficient documentation

## 2019-08-07 DIAGNOSIS — R5383 Other fatigue: Secondary | ICD-10-CM | POA: Insufficient documentation

## 2019-08-07 LAB — CBC WITH DIFFERENTIAL (CANCER CENTER ONLY)
Abs Immature Granulocytes: 0.05 10*3/uL (ref 0.00–0.07)
Basophils Absolute: 0.1 10*3/uL (ref 0.0–0.1)
Basophils Relative: 1 %
Eosinophils Absolute: 0.1 10*3/uL (ref 0.0–0.5)
Eosinophils Relative: 2 %
HCT: 36.8 % (ref 36.0–46.0)
Hemoglobin: 11.8 g/dL — ABNORMAL LOW (ref 12.0–15.0)
Immature Granulocytes: 1 %
Lymphocytes Relative: 37 %
Lymphs Abs: 2.4 10*3/uL (ref 0.7–4.0)
MCH: 30.7 pg (ref 26.0–34.0)
MCHC: 32.1 g/dL (ref 30.0–36.0)
MCV: 95.8 fL (ref 80.0–100.0)
Monocytes Absolute: 0.5 10*3/uL (ref 0.1–1.0)
Monocytes Relative: 7 %
Neutro Abs: 3.4 10*3/uL (ref 1.7–7.7)
Neutrophils Relative %: 52 %
Platelet Count: 232 10*3/uL (ref 150–400)
RBC: 3.84 MIL/uL — ABNORMAL LOW (ref 3.87–5.11)
RDW: 12.5 % (ref 11.5–15.5)
WBC Count: 6.5 10*3/uL (ref 4.0–10.5)
nRBC: 0 % (ref 0.0–0.2)

## 2019-08-07 LAB — RETICULOCYTES
Immature Retic Fract: 11.1 % (ref 2.3–15.9)
RBC.: 3.7 MIL/uL — ABNORMAL LOW (ref 3.87–5.11)
Retic Count, Absolute: 81.8 10*3/uL (ref 19.0–186.0)
Retic Ct Pct: 2.2 % (ref 0.4–3.1)

## 2019-08-07 NOTE — Progress Notes (Signed)
Hematology and Oncology Follow Up Visit  Breiana Eckhardt [id number] 22-Dec-1975 43 y.o. 08/07/2019   Principle Diagnosis:  Iron deficiency anemia  Current Therapy:   IV iron as indicated   Interim History:  Ms. Boatwright is here today for follow-up. She is doing well but does occasionally have mild fatigue and SOB with over exertion.  No episodes of bleeding. No bruising or petechiae.  She is staying busy with work. No fever, chills, n/v, cough, rash, dizziness, SOB, chest pain, palpitations, abdominal pain or changes in bowel or bladder habits.  She uses a stool softener as needed for constipation.  No swelling, numbness or tingling in her extremities.  She has arthritis in the right knee that flares up at times. She states that she also notes sciatica in the same leg due to chronic lower back and hip issues.  No falls or syncope.  She has a good appetite and is staying well hydrated. Her weight is stable.   ECOG Performance Status: 1 - Symptomatic but completely ambulatory  Medications:  Allergies as of 08/07/2019      Reactions   Latex Other (See Comments)   DERMITITIS   Other Hives, Swelling   PORK (alphagal)-lip/tongue swelling, hives   Tramadol Nausea And Vomiting      Medication List       Accurate as of August 07, 2019  3:33 PM. If you have any questions, ask your nurse or doctor.        albuterol 108 (90 Base) MCG/ACT inhaler Commonly known as: VENTOLIN HFA Inhale 2 puffs into the lungs every 6 (six) hours as needed for wheezing or shortness of breath.   ALPRAZolam 0.25 MG tablet Commonly known as: XANAX Take 0.25 mg by mouth daily as needed for anxiety.   cetirizine 10 MG tablet Commonly known as: ZYRTEC Take 10 mg by mouth daily.   Diclofenac Sodium 2 % Soln Place 2 g onto the skin 2 (two) times daily.   EpiPen 2-Pak 0.3 mg/0.3 mL Soaj injection Generic drug: EPINEPHrine 0.3 mg as needed (environmental allergies).   etodolac 400 MG tablet  Commonly known as: LODINE TK 1 T PO BID PRF PAIN   Ferrous Fumarate 324 (106 Fe) MG Tabs tablet Commonly known as: HEMOCYTE - 106 mg FE Take 1 tablet by mouth daily.   fluticasone 50 MCG/ACT nasal spray Commonly known as: FLONASE Place 1 spray into both nostrils daily.   gabapentin 100 MG capsule Commonly known as: NEURONTIN Take 2 capsules (200 mg total) by mouth at bedtime.   ibuprofen 800 MG tablet Commonly known as: ADVIL Take 1 tablet (800 mg total) by mouth every 8 (eight) hours as needed. What changed: reasons to take this   Vitamin D (Ergocalciferol) 1.25 MG (50000 UT) Caps capsule Commonly known as: DRISDOL Take 1 capsule (50,000 Units total) by mouth every 7 (seven) days.       Allergies:  Allergies  Allergen Reactions  . Latex Other (See Comments)    DERMITITIS  . Other Hives and Swelling    PORK (alphagal)-lip/tongue swelling, hives  . Tramadol Nausea And Vomiting    Past Medical History, Surgical history, Social history, and Family History were reviewed and updated.  Review of Systems: All other 10 point review of systems is negative.   Physical Exam:  vitals were not taken for this visit.   Wt Readings from Last 3 Encounters:  04/17/19 211 lb (95.7 kg)  03/27/19 210 lb (95.3 kg)  02/27/19 223 lb (101.2  kg)    Ocular: Sclerae unicteric, pupils equal, round and reactive to light Ear-nose-throat: Oropharynx clear, dentition fair Lymphatic: No cervical or supraclavicular adenopathy Lungs no rales or rhonchi, good excursion bilaterally Heart regular rate and rhythm, no murmur appreciated Abd soft, nontender, positive bowel sounds, no liver or spleen tip palpated on exam, no fluid wave  MSK no focal spinal tenderness, no joint edema Neuro: non-focal, well-oriented, appropriate affect Breasts: Deferred   Lab Results  Component Value Date   WBC 6.5 08/07/2019   HGB 11.8 (L) 08/07/2019   HCT 36.8 08/07/2019   MCV 95.8 08/07/2019   PLT 232  08/07/2019   Lab Results  Component Value Date   FERRITIN 102 04/04/2019   IRON 64 04/04/2019   TIBC 245 04/04/2019   UIBC 181 04/04/2019   IRONPCTSAT 26 04/04/2019   Lab Results  Component Value Date   RETICCTPCT 2.2 08/07/2019   RBC 3.84 (L) 08/07/2019   RBC 3.70 (L) 08/07/2019   No results found for: KPAFRELGTCHN, LAMBDASER, KAPLAMBRATIO No results found for: IGGSERUM, IGA, IGMSERUM No results found for: Odetta Pink, SPEI   Chemistry      Component Value Date/Time   NA 138 04/04/2019 1509   NA 137 06/24/2018 1445   K 3.9 04/04/2019 1509   CL 104 04/04/2019 1509   CO2 26 04/04/2019 1509   BUN 11 04/04/2019 1509   BUN 10 06/24/2018 1445   CREATININE 0.58 04/04/2019 1509   CREATININE 0.63 11/24/2016 0956      Component Value Date/Time   CALCIUM 8.5 (L) 04/04/2019 1509   ALKPHOS 79 04/04/2019 1509   AST 15 04/04/2019 1509   ALT 12 04/04/2019 1509   BILITOT 0.6 04/04/2019 1509       Impression and Plan: Ms. Benkert is a very pleasant 43 yo African American female with iron deficiency secondary to malabsorption.  We will see what her iron studies show and bring her back in for infusion if needed.  We will plan to see her back in a year per her request.  She will contact our office with nay questions or concerns. We can certainly see her sooner if needed.   [name], NP 11/9/20203:33 PM

## 2019-08-08 LAB — FERRITIN: Ferritin: 41 ng/mL (ref 11–307)

## 2019-08-08 LAB — IRON AND TIBC
Iron: 38 ug/dL — ABNORMAL LOW (ref 41–142)
Saturation Ratios: 13 % — ABNORMAL LOW (ref 21–57)
TIBC: 288 ug/dL (ref 236–444)
UIBC: 249 ug/dL (ref 120–384)

## 2019-08-09 ENCOUNTER — Telehealth: Payer: Self-pay | Admitting: Hematology & Oncology

## 2019-08-09 NOTE — Telephone Encounter (Signed)
I called and LMVM for patient regarding appointments being added for Iron infusion.  I asked her to call back to confirm dates & times per 11/10 sch msg

## 2019-08-09 NOTE — Telephone Encounter (Signed)
Patient called back stating she could not come in for iron infusion on the dates I had her scheduled.  She has requested 12/4 @ 12/14 due to her being off work those dates.  I asked Thane Edu, RN she stated that it is up to the patient when she comes in and this was no optimal for her iron infusion.  She voiced understanding

## 2019-08-11 ENCOUNTER — Ambulatory Visit: Payer: BC Managed Care – PPO

## 2019-08-18 ENCOUNTER — Ambulatory Visit: Payer: BC Managed Care – PPO

## 2019-09-01 ENCOUNTER — Ambulatory Visit: Payer: BC Managed Care – PPO

## 2019-09-11 ENCOUNTER — Ambulatory Visit: Payer: BC Managed Care – PPO

## 2019-10-01 ENCOUNTER — Other Ambulatory Visit: Payer: Self-pay | Admitting: Obstetrics & Gynecology

## 2019-10-02 NOTE — Telephone Encounter (Signed)
Medication refill request: vitamin d 50,000iu Last AEX:  06-24-18, vitamin d last checked 08/2018 Next AEX: 06-25-2020 Last MMG (if hormonal medication request): n/a Refill authorized: please approve if appropriate

## 2019-10-30 DIAGNOSIS — Q8501 Neurofibromatosis, type 1: Secondary | ICD-10-CM | POA: Diagnosis not present

## 2019-12-12 IMAGING — US US ABDOMEN COMPLETE
1 series · 13 of 25 positions shown · non-contrast
Comparison: Abdominal ultrasound May 30, 2002

CLINICAL DATA: Abdominal pain for 3 months. History of
neurofibromatosis

EXAM:
ABDOMEN ULTRASOUND COMPLETE

[Series 1: us abdomen complete · 0.20mm/px · 13 of 80 slices shown]
[im 1/80]
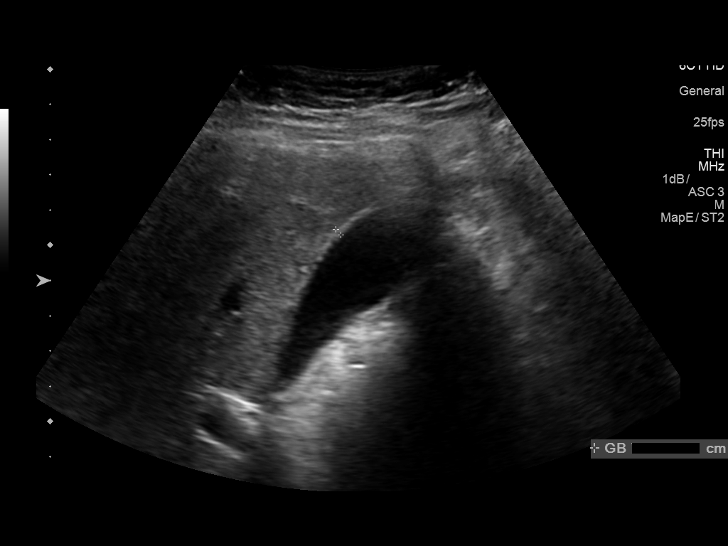
[im 7/80]
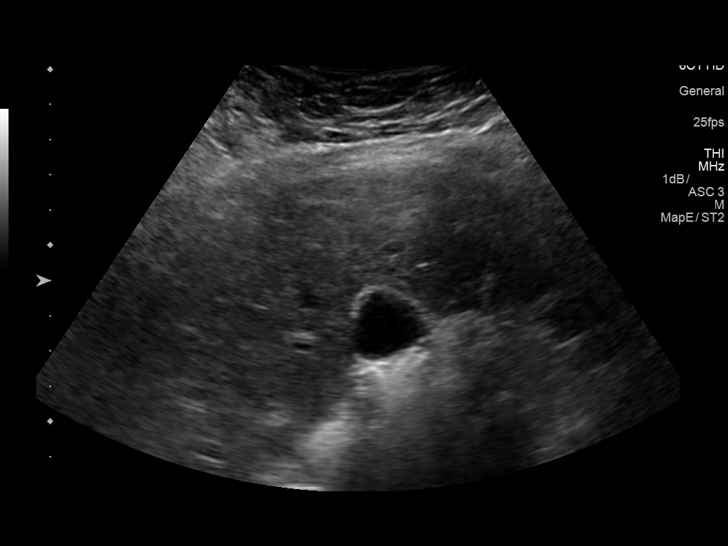
[im 14/80]
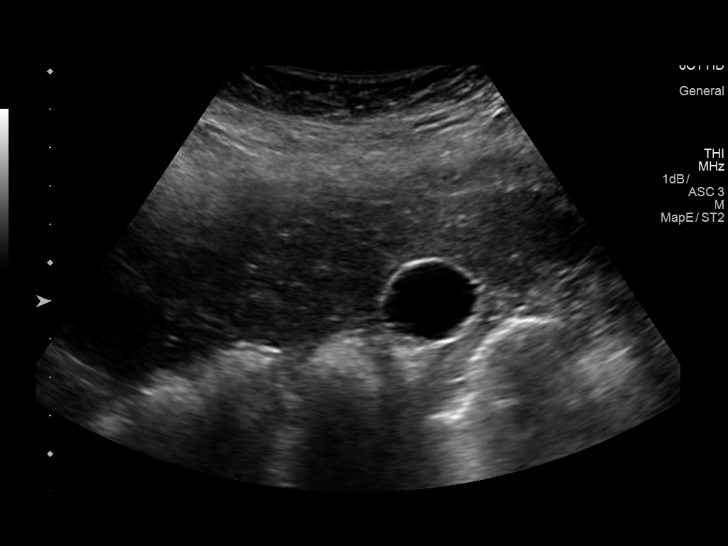
[im 20/80]
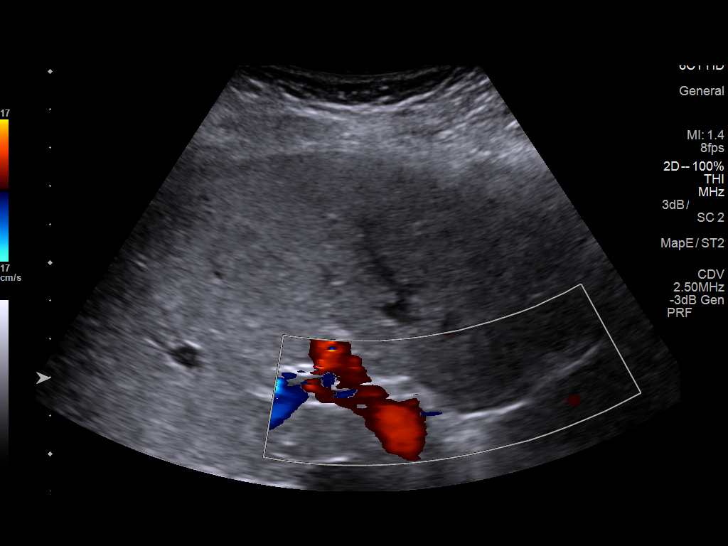
[im 27/80]
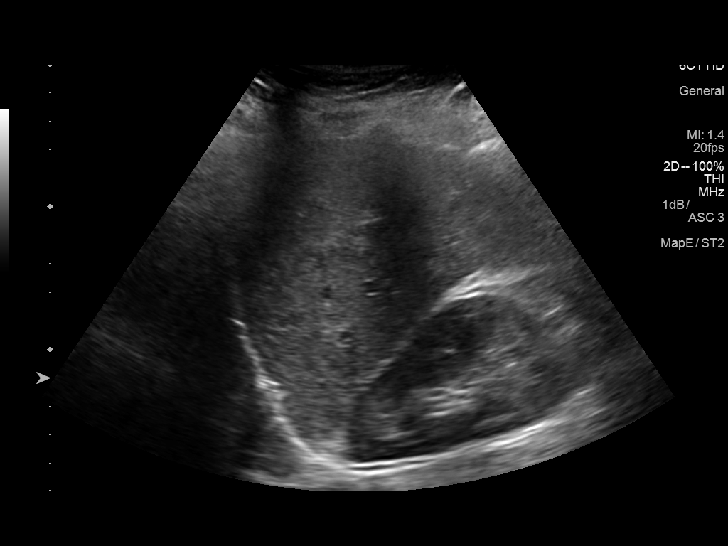
[im 33/80]
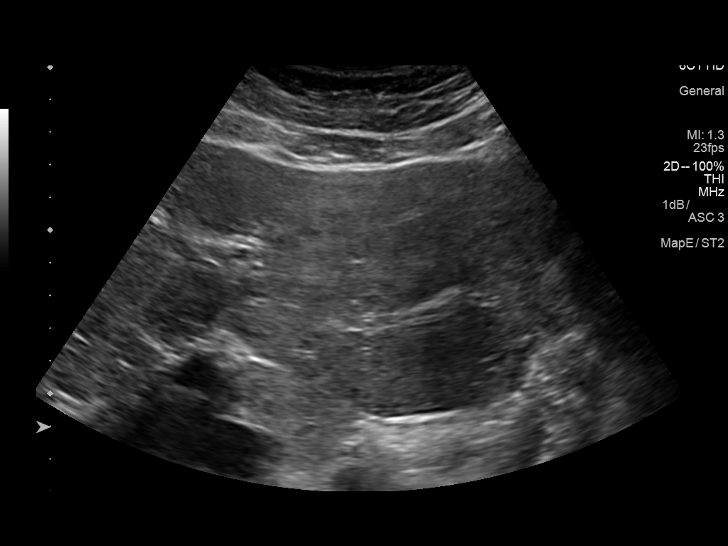
[im 40/80]
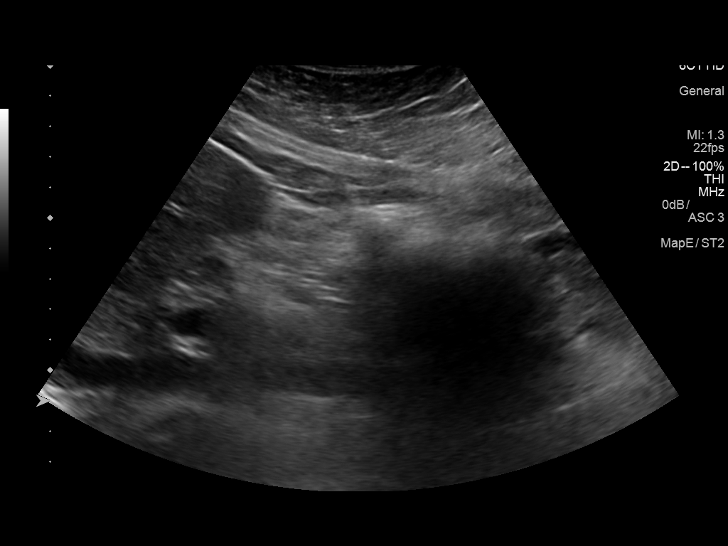
[im 47/80]
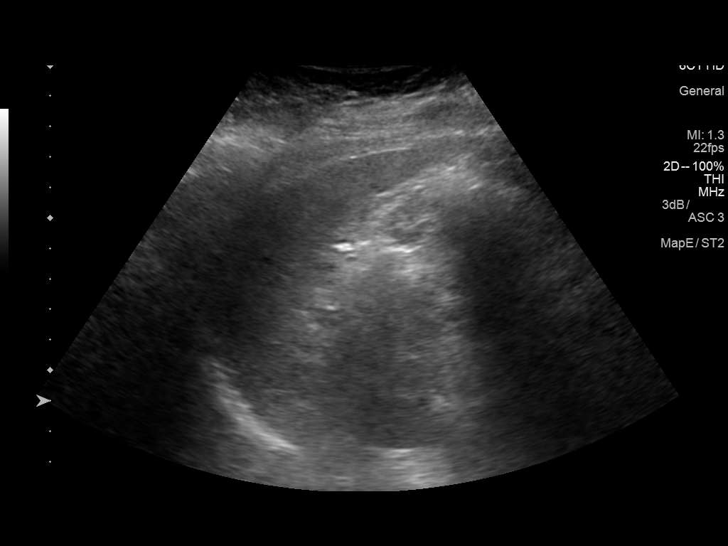
[im 53/80]
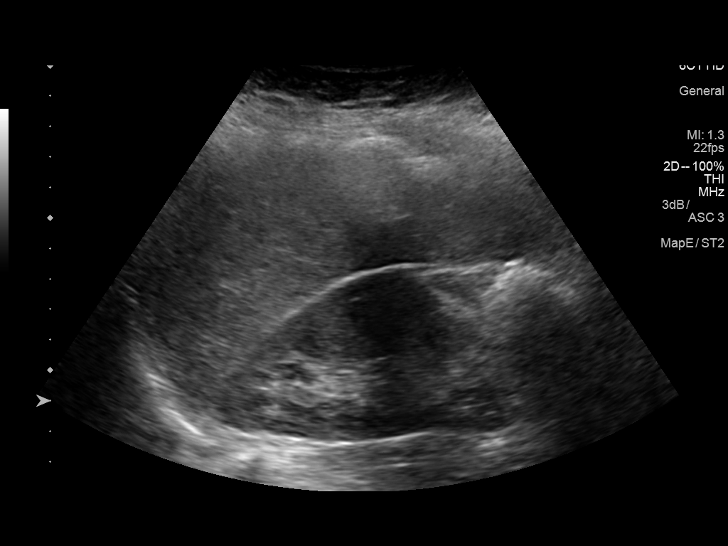
[im 60/80]
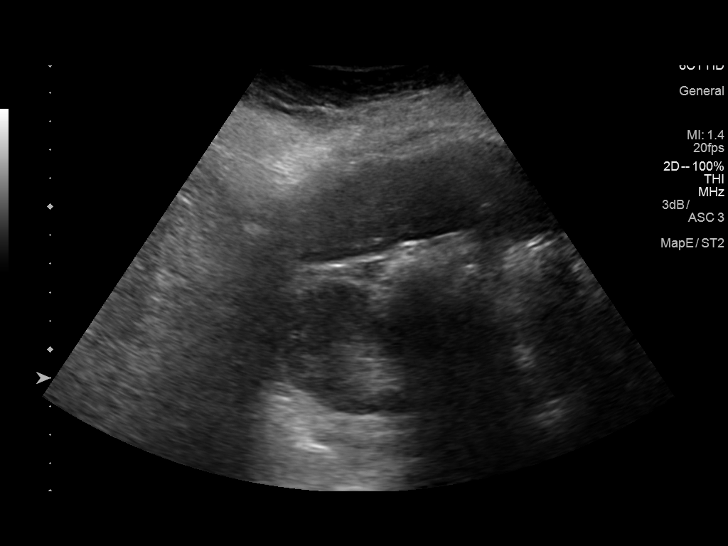
[im 66/80]
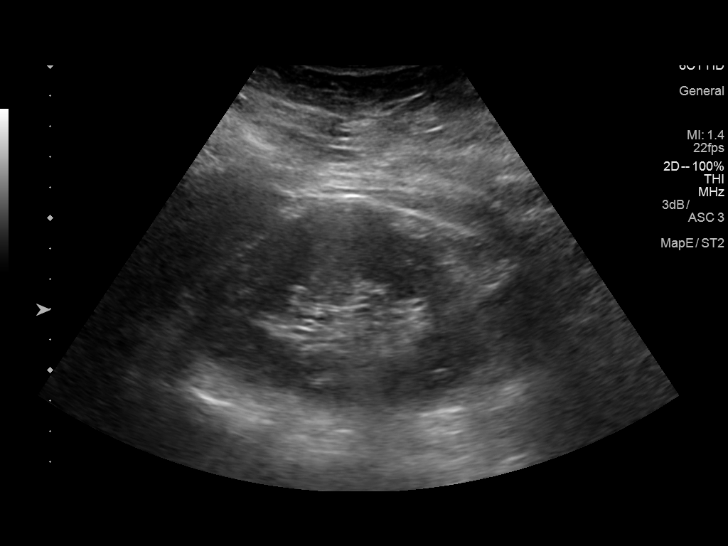
[im 73/80]
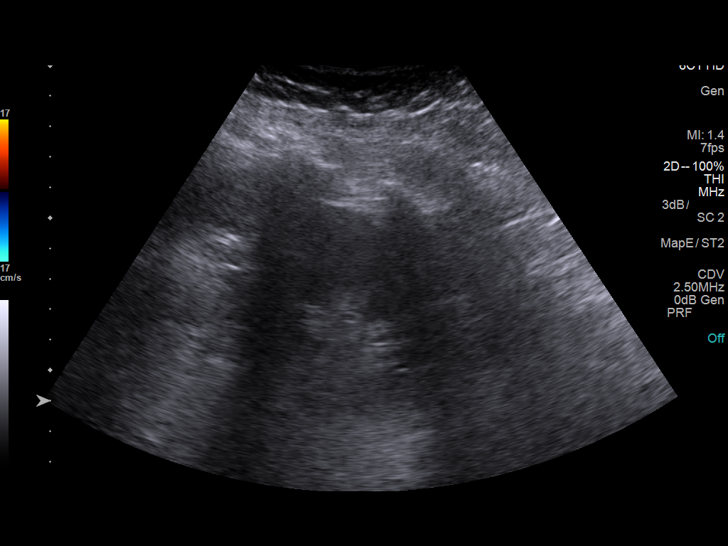
[im 80/80]
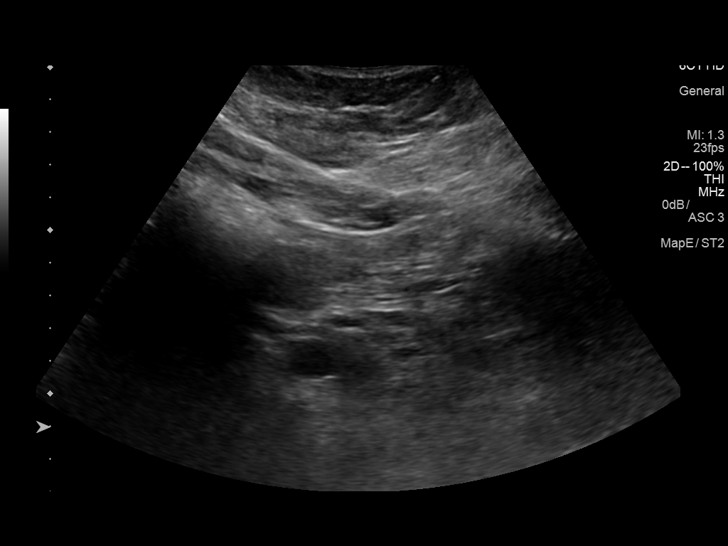

[13 of 25 positions shown; findings below may reference images not displayed]

FINDINGS: Gallbladder: No gallstones or wall thickening visualized. There is
no pericholecystic fluid. No sonographic Murphy sign noted by
sonographer.

Common bile duct: Diameter: 3 mm. No intrahepatic, common hepatic,
or common bile duct dilatation.

Liver: No focal lesion identified. Liver echogenicity is overall
increased. Portal vein is patent on color Doppler imaging with
normal direction of blood flow towards the liver.

IVC: No abnormality visualized.

Pancreas: No pancreatic mass or inflammatory focus.

Spleen: Size and appearance within normal limits.

Right Kidney: Length: 10.3 cm. Echogenicity within normal limits. No
mass or hydronephrosis visualized.

Left Kidney: Length: 10.4 cm. Echogenicity within normal limits. No
mass or hydronephrosis visualized.

Abdominal aorta: No aneurysm visualized.

Other findings: No demonstrable ascites.
IMPRESSION: Overall increase in liver echogenicity, a finding felt to be
indicative of hepatic steatosis. While no focal liver lesions are
evident on this study, it must be cautioned that sensitivity of
ultrasound for detection of focal liver lesions is diminished in
this circumstance.

Study otherwise unremarkable.

## 2020-02-05 ENCOUNTER — Other Ambulatory Visit (HOSPITAL_COMMUNITY)
Admission: RE | Admit: 2020-02-05 | Discharge: 2020-02-05 | Disposition: A | Discharge status: Home / Self Care | Payer: BC Managed Care – PPO | Source: Ambulatory Visit | Attending: Obstetrics and Gynecology | Admitting: Obstetrics and Gynecology

## 2020-02-05 ENCOUNTER — Ambulatory Visit: Payer: BC Managed Care – PPO | Admitting: Obstetrics and Gynecology

## 2020-02-05 ENCOUNTER — Other Ambulatory Visit: Payer: Self-pay

## 2020-02-05 ENCOUNTER — Encounter: Payer: Self-pay | Admitting: Obstetrics and Gynecology

## 2020-02-05 VITALS — BP 122/70 | HR 80 | Temp 97.2°F | Ht 61.0 in | Wt 213.0 lb

## 2020-02-05 DIAGNOSIS — B373 Candidiasis of vulva and vagina: Secondary | ICD-10-CM | POA: Insufficient documentation

## 2020-02-05 DIAGNOSIS — Z833 Family history of diabetes mellitus: Secondary | ICD-10-CM

## 2020-02-05 DIAGNOSIS — Z113 Encounter for screening for infections with a predominantly sexual mode of transmission: Secondary | ICD-10-CM | POA: Diagnosis not present

## 2020-02-05 DIAGNOSIS — N760 Acute vaginitis: Secondary | ICD-10-CM

## 2020-02-05 MED ORDER — FLUCONAZOLE 150 MG PO TABS: 150.0000 mg | ORAL_TABLET | Freq: Once | ORAL | 0 refills | Status: AC

## 2020-02-05 NOTE — Progress Notes (Signed)
GYNECOLOGY  VISIT   HPI: 44 y.o.   Single  African American  female   [id number] with Patient's last menstrual period was 05/07/2017.   here for vaginal itching and white-creamy discharge   She used a Monistat ovule yesterday, and she felt like she was on fire.  She tried to wipe it out.   She wants to know where the vaginitis came from.   New partner.  Not consistent use of condoms.   No recent abx.   Started working out.  Did not use underwear.   No new exposures to personal products.  Uses [employer].  FH diabetes.   GYNECOLOGIC HISTORY: Patient's last menstrual period was 05/07/2017. Contraception:  Hysterectomy Menopausal hormone therapy:  none Last mammogram:  03/27/19 BIRADS 1 negative/density b Last pap smear:   11/19/15 Neg:Neg HR HVPV        OB History    Gravida  1   Para  0   Term  0   Preterm  0   AB  1   Living  0     SAB  0   TAB  0   Ectopic  1   Multiple  0   Live Births  0              Patient Active Problem List   Diagnosis Date Noted  . Lumbar radiculopathy, right 06/13/2019  . Patellofemoral arthritis of right knee 02/27/2019  . IDA (iron deficiency anemia) 10/28/2018  . Cervical stenosis of spinal canal 02/07/2018  . Neurofibromatosis ([place]) 02/04/2017  . Greater trochanteric bursitis of left hip 08/31/2016  . Endometriosis 01/06/2013  . Seasonal and perennial allergic rhinitis 05/29/2012  . Asthma, mild intermittent 05/29/2012    Past Medical History:  Diagnosis Date  . Anxiety    slight  . Asthma    albuterol rescue inhaler-uses prn, exacerbated by pet dander  . History of ectopic pregnancy 1999   S/P RIGHT SALPINGECTOMY  . History of herpes simplex type 2 infection 2005  . History of uterine fibroid   . Neurofibromatosis [facility])    dx is child--  nonmalignant  . Pneumonia    years ago  . Seasonal allergies     Past Surgical History:  Procedure Laterality Date  . CYSTOSCOPY N/A 05/11/2017   Procedure: CYSTOSCOPY;   Surgeon: Megan Salon, MD;  Location: [facility];  Service: Gynecology;  Laterality: N/A;  . DILATATION & CURETTAGE/HYSTEROSCOPY WITH MYOSURE N/A 08/07/2016   Procedure: HYSTEROSCOPY WITH MYOSURE;  Surgeon: Megan Salon, MD;  Location: [facility];  Service: Gynecology;  Laterality: N/A;  Fibroid resection  . IUD REMOVAL N/A 08/07/2016   Procedure: INTRAUTERINE DEVICE (IUD) REMOVAL;  Surgeon: Megan Salon, MD;  Location: [facility];  Service: Gynecology;  Laterality: N/A;  . IUD REMOVAL N/A 05/11/2017   Procedure: INTRAUTERINE DEVICE (IUD) REMOVAL;  Surgeon: Megan Salon, MD;  Location: [facility];  Service: Gynecology;  Laterality: N/A;  . LAPAROSCOPIC LYSIS OF ADHESIONS  05/11/2017   Procedure: LAPAROSCOPIC LYSIS OF ADHESIONS;  Surgeon: Megan Salon, MD;  Location: [facility];  Service: Gynecology;;  with cautery of endometroisis  . LAPAROSCOPY FOR ECTOPIC PREGNANCY  2000   RIGHT SALPINGECTOMY  . ROBOT ASSISTED MYOMECTOMY N/A 12/16/2012   Procedure: ROBOTIC ASSISTED MYOMECTOMY;  Surgeon: Governor Specking, MD;  Location: [facility];  Service: Gynecology;  Laterality: N/A;  . ROBOTIC ASSISTED LAPAROSCOPIC LYSIS OF ADHESION  12/16/2012   Procedure: ROBOTIC ASSISTED LAPAROSCOPIC LYSIS  OF ADHESION;  Surgeon: Governor Specking, MD;  Location: [facility];  Service: Gynecology;;  with excision of perotoneal lesions  . TOTAL LAPAROSCOPIC HYSTERECTOMY WITH SALPINGECTOMY Bilateral 05/11/2017   Procedure: HYSTERECTOMY TOTAL LAPAROSCOPIC WITH LEFT SALPINGECTOMY;  Surgeon: Megan Salon, MD;  Location: [facility];  Service: Gynecology;  Laterality: Bilateral;  . WISDOM TOOTH EXTRACTION      Current Outpatient Medications  Medication Sig Dispense Refill  . albuterol (PROVENTIL HFA;VENTOLIN HFA) 108 (90 BASE) MCG/ACT inhaler Inhale 2 puffs into the lungs every 6 (six) hours as needed for wheezing or shortness of breath.     . ALPRAZolam (XANAX) 0.25 MG tablet Take 0.25 mg by mouth daily as needed  for anxiety.     . cetirizine (ZYRTEC) 10 MG tablet Take 10 mg by mouth daily.    . Ferrous Fumarate (HEMOCYTE - 106 MG FE) 324 (106 Fe) MG TABS tablet Take 1 tablet by mouth daily.    Marland Kitchen ibuprofen (ADVIL,MOTRIN) 800 MG tablet Take 1 tablet (800 mg total) by mouth every 8 (eight) hours as needed. (Patient taking differently: Take 800 mg by mouth every 8 (eight) hours as needed for moderate pain. ) 30 tablet 1  . Vitamin D, Ergocalciferol, (DRISDOL) 1.25 MG (50000 UT) CAPS capsule TAKE ONE CAPSULE BY MOUTH EVERY 7 DAYS 12 capsule 3  . EPIPEN 2-PAK 0.3 MG/0.3ML DEVI 0.3 mg as needed (environmental allergies).     . gabapentin (NEURONTIN) 100 MG capsule Take 2 capsules (200 mg total) by mouth at bedtime. (Patient not taking: Reported on 02/05/2020) 180 capsule 0   No current facility-administered medications for this visit.     ALLERGIES: Latex, Other, and Tramadol  Family History  Adopted: Yes  Problem Relation Age of Onset  . Allergies Mother        food  . Hypertension Mother   . Hyperlipidemia Mother   . Heart failure Mother   . Thyroid disease Mother   . Diabetes Mother   . Lung cancer Father   . Diabetes Sister   . Hypertension Sister   . Renal Disease Sister        dialysis  . Uterine cancer Paternal Grandmother   . Breast cancer Neg Hx     Social History   Socioeconomic History  . Marital status: Single    Spouse name: Not on file  . Number of children: 0  . Years of education: Not on file  . Highest education level: Not on file  Occupational History  . Occupation: Engineer, mining at [employer]: [place]  Tobacco Use  . Smoking status: Never Smoker  . Smokeless tobacco: Never Used  Substance and Sexual Activity  . Alcohol use: Yes    Alcohol/week: 0.0 standard drinks    Comment: OCCASIONAL  . Drug use: No  . Sexual activity: Yes    Partners: Male    Birth control/protection: Surgical    Comment: [place] 05/11/17   Other Topics Concern  . Not on  file  Social History Narrative   Lives at home with her sister   Right handed   Caffeine: 6 cups daily   Social Determinants of Health   Financial Resource Strain:   . Difficulty of Paying Living Expenses:   Food Insecurity:   . Worried About Charity fundraiser in the Last Year:   . Arboriculturist in the Last Year:   Transportation Needs:   . Film/video editor (Medical):   Marland Kitchen  Lack of Transportation (Non-Medical):   Physical Activity:   . Days of Exercise per Week:   . Minutes of Exercise per Session:   Stress:   . Feeling of Stress :   Social Connections:   . Frequency of Communication with Friends and Family:   . Frequency of Social Gatherings with Friends and Family:   . Attends Religious Services:   . Active Member of Clubs or Organizations:   . Attends Archivist Meetings:   Marland Kitchen Marital Status:   Intimate Partner Violence:   . Fear of Current or Ex-Partner:   . Emotionally Abused:   Marland Kitchen Physically Abused:   . Sexually Abused:     Review of Systems  Constitutional: Negative.   HENT: Negative.   Eyes: Negative.   Respiratory: Negative.   Cardiovascular: Negative.   Gastrointestinal: Negative.   Endocrine: Negative.   Genitourinary: Positive for vaginal discharge.       Vaginal itching  Musculoskeletal: Negative.   Skin: Negative.   Allergic/Immunologic: Negative.   Neurological: Negative.   Hematological: Negative.   Psychiatric/Behavioral: Negative.     PHYSICAL EXAMINATION:    BP 122/70 (BP Location: Right Arm, Patient Position: Sitting, Cuff Size: Large)   Pulse 80   Temp (!) 97.2 F (36.2 C) (Temporal)   Ht 5\' 1"  (1.549 m)   Wt 213 lb (96.6 kg)   LMP 05/07/2017   BMI 40.25 kg/m     General appearance: alert, cooperative and appears stated age    Pelvic: External genitalia:  no lesions              Urethra:  normal appearing urethra with no masses, tenderness or lesions              Bartholins and Skenes: normal                  Vagina: normal appearing vagina with normal color and discharge, no lesions              Cervix: no lesions.  Large amount of clumpy yellow, green discharge.                  Bimanual Exam:  Uterus:  normal size, contour, position, consistency, mobility, non-tender              Adnexa: no mass, fullness, tenderness                Chaperone was present for exam.  ASSESSMENT  Vaginitis.  FH diabetes mellitus.  STD screening.  PLAN  STD screening.  Yeast and BV testing.  Will prescribed Diflucan 150 mg po x 1.  May repeat in 72 hours prn. Check A1C.    An After Visit Summary was printed and given to the patient.  __20___ minutes face to face time of which over 50% was spent in counseling.

## 2020-02-06 LAB — CERVICOVAGINAL ANCILLARY ONLY
Bacterial Vaginitis (gardnerella): NEGATIVE
Candida Glabrata: NEGATIVE
Candida Vaginitis: POSITIVE — AB
Chlamydia: NEGATIVE
Comment: NEGATIVE
Comment: NEGATIVE
Comment: NEGATIVE
Comment: NEGATIVE
Comment: NEGATIVE
Comment: NORMAL
Neisseria Gonorrhea: NEGATIVE
Trichomonas: NEGATIVE

## 2020-02-06 LAB — HEP, RPR, HIV PANEL
HIV Screen 4th Generation wRfx: NONREACTIVE
Hepatitis B Surface Ag: NEGATIVE
RPR Ser Ql: NONREACTIVE

## 2020-02-06 LAB — HEMOGLOBIN A1C
Est. average glucose Bld gHb Est-mCnc: 94 mg/dL
Hgb A1c MFr Bld: 4.9 % (ref 4.8–5.6)

## 2020-02-06 LAB — HEPATITIS C ANTIBODY: Hep C Virus Ab: <0.1 s/co ratio (ref 0.0–0.9)

## 2020-02-13 ENCOUNTER — Other Ambulatory Visit (HOSPITAL_BASED_OUTPATIENT_CLINIC_OR_DEPARTMENT_OTHER): Payer: Self-pay | Admitting: Obstetrics & Gynecology

## 2020-02-13 DIAGNOSIS — Z1231 Encounter for screening mammogram for malignant neoplasm of breast: Secondary | ICD-10-CM

## 2020-03-15 ENCOUNTER — Encounter (HOSPITAL_BASED_OUTPATIENT_CLINIC_OR_DEPARTMENT_OTHER): Payer: BC Managed Care – PPO

## 2020-03-26 ENCOUNTER — Other Ambulatory Visit: Payer: Self-pay

## 2020-03-26 ENCOUNTER — Ambulatory Visit (HOSPITAL_BASED_OUTPATIENT_CLINIC_OR_DEPARTMENT_OTHER)
Admission: RE | Admit: 2020-03-26 | Discharge: 2020-03-26 | Disposition: A | Discharge status: Home / Self Care | Payer: BC Managed Care – PPO | Source: Ambulatory Visit | Attending: Obstetrics & Gynecology | Admitting: Obstetrics & Gynecology

## 2020-03-26 ENCOUNTER — Encounter (HOSPITAL_BASED_OUTPATIENT_CLINIC_OR_DEPARTMENT_OTHER): Payer: Self-pay

## 2020-03-26 DIAGNOSIS — Z1231 Encounter for screening mammogram for malignant neoplasm of breast: Secondary | ICD-10-CM | POA: Diagnosis not present

## 2020-04-18 NOTE — Progress Notes (Signed)
New Patient Note  RE: Lacey Lee MRN: [id number] DOB: May 24, 1976 Date of Office Visit: 04/19/2020  Referring provider: Alroy Dust, L.Lacey Sa, MD Primary care provider: Alroy Dust, Lacey Jews.Lacey Sa, MD  Chief Complaint: Allergic Rhinitis  (sneezing, cough, recurrent sinus infections, congestion, itchy eyes and throat ) and Shortness of Breath (when allergies flare up)  History of Present Illness: I had the pleasure of seeing Lacey Lee for initial evaluation at the [facility] on 04/21/2020. She is a 44 y.o. female, who is self-referred here for re-establishing care for allergic rhinitis, shortness of breath, food allergy. Patient was seen in our office over 3 years ago.  Rhinitis: She reports symptoms of sneezing, coughing, itchy eyes, drainage, nasal congestion. Symptoms have been going on for 4 years. The symptoms are present all year around. Anosmia: sometimes diminished sense of smell. Headache: no. She has used sudafed, zyrtec, Claritin, allegra, Flonase, saline nasal spray with minimal improvement in symptoms. Sinus infections: 2. Previous work up includes: 2013 skin testing showed positive to grass, weed, ragweed, trees, mold, dust mites, cat, dog, horse. Patient was on build up injections in the past but never reached maintenance. Not sure if had improvement in symptoms while on AIT. Previous ENT evaluation: not recently. Previous sinus imaging: no. History of nasal polyps: no. Last eye exam: this year. History of reflux: no.  Breathing:  She reports symptoms of chest tightness, coughing, wheezing for a few years. Current medications include albuterol prn which help. She reports not using aerochamber with inhalers. She tried the following inhalers: none. Main triggers are allergies, weather changes, pet exposure. In the last month, frequency of symptoms: <1x/week. Frequency of nocturnal symptoms: 0x/month. Frequency of SABA use: <1x/week. Interference with physical  activity: no. Sleep is undisturbed. In the last 12 months, emergency room visits/urgent care visits/doctor office visits or hospitalizations due to respiratory issues: 0. In the last 12 months, oral steroids courses: 0. Lifetime history of hospitalization for respiratory issues: 0. Prior intubations: no. History of pneumonia: as a child. She was evaluated by allergist in the past. Smoking exposure: no. Up to date with flu vaccine: yes. Up to date with COVID-19 vaccine: no.   Assessment and Plan: [name] is a 44 y.o. female with: Other allergic rhinitis Perennial rhino conjunctivitis symptoms for the past 4 years. Tried multiple OTC antihistamines, Flonase with minimal benefit. 2013 skin testing showed positive to grass, weed, ragweed, trees, mold, dust mites, cat, dog, horse. On build up AIT in the past with unchanged symptoms - never reached maintenance.  Today's skin testing showed: Positive to grass, weed, ragweed, trees, mold, dust mites, cat, dog.  Start environmental control measures as below.  May use over the counter antihistamines such as Zyrtec (cetirizine), Claritin (loratadine), Allegra (fexofenadine), or Xyzal (levocetirizine) daily as needed. May take twice a day if needed.  Start Singulair (montelukast) 10mg  daily at night. Cautioned that in some children/adults can experience behavioral changes including hyperactivity, agitation, depression, sleep disturbances and suicidal ideations. These side effects are rare, but if you notice them you should notify me and discontinue Singulair (montelukast). Start dymista (fluticasone + azelastine nasal spray combination) 1 spray per nostril twice a day. If it's not covered let us know.   Nasal saline spray (i.e., Simply Saline) or nasal saline lavage (i.e., NeilMed) is recommended as needed and prior to medicated nasal sprays.  May use olopatadine eye drops 0.2% once a day as needed for itchy/watery eyes.  Had a detailed discussion with  patient/family that  clinical history is suggestive of allergic rhinitis, and may benefit from allergy immunotherapy (AIT). Discussed in detail regarding the dosing, schedule, side effects (mild to moderate local allergic reaction and rarely systemic allergic reactions including anaphylaxis), and benefits (significant improvement in nasal symptoms, seasonal flares of asthma) of immunotherapy with the patient. There is significant time commitment involved with allergy shots, which includes weekly immunotherapy injections for first 9-12 months and then biweekly to monthly injections for 3-5 years.   Read about allergy injections. Let us know when ready to start.   Allergic conjunctivitis of both eyes  See assessment and plan as above for allergic rhinitis.   Mild intermittent reactive airway disease Episodes of chest tightness, coughing and wheezing for a few years. Main triggers allergies, weather changes and pets. Uses albuterol less than once a week with good benefit.  Today's spirometry was normal. . May use albuterol rescue inhaler 2 puffs every 4 to 6 hours as needed for shortness of breath, chest tightness, coughing, and wheezing. May use albuterol rescue inhaler 2 puffs 5 to 15 minutes prior to strenuous physical activities. Monitor frequency of use.   Allergy to alpha-gal Diagnosed with alpha gal allergy 3 years ago and no recent bloodwork. No recent tick bites.  Continue to avoid red meat.  I have prescribed epinephrine injectable and demonstrated proper use. For mild symptoms you can take over the counter antihistamines such as Benadryl and monitor symptoms closely. If symptoms worsen or if you have severe symptoms including breathing issues, throat closure, significant swelling, whole body hives, severe diarrhea and vomiting, lightheadedness then inject epinephrine and seek immediate medical care afterwards.  Food action plan given.  Get alpha gal bloodwork when able to.   Return in  about 3 months (around 07/20/2020).  Meds ordered this encounter  Medications  . Azelastine-Fluticasone 137-50 MCG/ACT SUSP    Sig: Place 1 spray into the nose in the morning and at bedtime.    Dispense:  23 g    Refill:  5    Failed Flonase.  . montelukast (SINGULAIR) 10 MG tablet    Sig: Take 1 tablet (10 mg total) by mouth at bedtime.    Dispense:  30 tablet    Refill:  5  . Olopatadine HCl 0.2 % SOLN    Sig: Apply 1 drop to eye daily as needed (itchy/watery eyes).    Dispense:  2.5 mL    Refill:  5  . EPINEPHrine 0.3 mg/0.3 mL IJ SOAJ injection    Sig: Inject 0.3 mLs (0.3 mg total) into the muscle as needed for anaphylaxis.    Dispense:  1 each    Refill:  2    Generic/mylan/teva brand    Lab Orders     Alpha-Gal Panel  Other allergy screening: Food allergy: yes  Diagnosed with alpha gal allergy about 3 years ago. No recent bloodwork. Avoiding red meat. She had lip and tongue swelling.  No recent tick bites.  Medication allergy: yes Hymenoptera allergy: no Urticaria: no Eczema:no History of recurrent infections suggestive of immunodeficency: no  Diagnostics: Spirometry:  Tracings reviewed. Her effort: Good reproducible efforts. FVC: 2.77L FEV1: 2.58L, 116% predicted FEV1/FVC ratio: 93% Interpretation: Spirometry consistent with normal pattern.  Please see scanned spirometry results for details.  Skin Testing: Environmental allergy panel and select foods. Positive to grass, weed, ragweed, trees, mold, dust mites, cat, dog. Negative to pork, lamb, beef. Results discussed with patient/family.  Airborne Adult Perc - 04/19/20 0923    Time Antigen  Placed [place] Lavella Hammock    Location Back    Number of Test 59    Panel 1 Select    1. Control-Buffer 50% Glycerol Negative    2. Control-Histamine 1 mg/ml 2+    3. Albumin saline Negative    4. [place] Negative    5. [place] Negative    6. Johnson Negative    7. Dacoma Blue Negative    8.  Meadow Fescue Negative    9. Perennial Rye Negative    10. Sweet Vernal Negative    11. Timothy Negative    12. Cocklebur Negative    13. Burweed Marshelder Negative    14. Ragweed, short Negative    15. Ragweed, Giant Negative    16. Plantain,  English Negative    17. Lamb's Quarters 2+    18. Sheep Sorrell 2+    19. Rough Pigweed Negative    20. Marsh Elder, Rough Negative    21. Mugwort, Common Negative    22. Ash mix Negative    23. Birch mix Negative    24. Beech American Negative    25. Box, Elder Negative    26. Cedar, red Negative    27. Cottonwood, Eastern 2+    28. Elm mix Negative    29. Hickory 2+    30. Maple mix Negative    31. Oak, [place] mix Negative    32. Pecan Pollen 2+    33. Pine mix Negative    34. Sycamore Eastern Negative    35. Montvale, Black Pollen Negative    36. Alternaria alternata Negative    37. Cladosporium Herbarum Negative    38. Aspergillus mix Negative    39. Penicillium mix Negative    40. Bipolaris sorokiniana (Helminthosporium) Negative    41. Drechslera spicifera (Curvularia) Negative    42. Mucor plumbeus Negative    43. Fusarium moniliforme Negative    44. Aureobasidium pullulans (pullulara) Negative    45. Rhizopus oryzae Negative    46. Botrytis cinera Negative    47. Epicoccum nigrum Negative    48. Phoma betae Negative    49. Candida Albicans Negative    50. Trichophyton mentagrophytes 2+    51. Mite, D Farinae  5,000 AU/ml Negative    52. Mite, D Pteronyssinus  5,000 AU/ml Negative    53. Cat Hair 10,000 BAU/ml 3+    54.  Dog Epithelia Negative    55. Mixed Feathers Negative    56. Horse Epithelia Negative    57. Cockroach, German Negative    58. Mouse Negative    59. Tobacco Leaf Negative          Intradermal - 04/19/20 0938    Time Antigen Placed 0940    Location Arm    Number of Test 12    Control Negative    Guatemala 2+    Johnson Negative    7 Grass Negative    Ragweed mix 2+    Mold 1 Negative    Mold  2 2+    Mold 3 Negative    Mold 4 Negative    Dog 3+    Cockroach Negative    Mite mix 3+          Food Adult Perc - 04/19/20 0900    Time Antigen Placed 3149    Allergen Manufacturer Lavella Hammock    Location Back    Number of allergen test 3    37. Pork Negative  40. Beef Negative    41. Lamb Negative           Past Medical History: Patient Active Problem List   Diagnosis Date Noted  . Allergy to alpha-gal 04/21/2020  . Allergic conjunctivitis of both eyes 04/21/2020  . Lumbar radiculopathy, right 06/13/2019  . Patellofemoral arthritis of right knee 02/27/2019  . IDA (iron deficiency anemia) 10/28/2018  . Cervical stenosis of spinal canal 02/07/2018  . Neurofibromatosis ([place]) 02/04/2017  . Greater trochanteric bursitis of left hip 08/31/2016  . Endometriosis 01/06/2013  . Other allergic rhinitis 05/29/2012  . Mild intermittent reactive airway disease 05/29/2012   Past Medical History:  Diagnosis Date  . Anxiety    slight  . Asthma    albuterol rescue inhaler-uses prn, exacerbated by pet dander  . History of ectopic pregnancy 1999   S/P RIGHT SALPINGECTOMY  . History of herpes simplex type 2 infection 2005  . History of uterine fibroid   . Neurofibromatosis [facility])    dx is child--  nonmalignant  . Pneumonia    years ago  . Seasonal allergies    Past Surgical History: Past Surgical History:  Procedure Laterality Date  . ABDOMINAL HYSTERECTOMY    . CYSTOSCOPY N/A 05/11/2017   Procedure: CYSTOSCOPY;  Surgeon: Megan Salon, MD;  Location: [facility];  Service: Gynecology;  Laterality: N/A;  . DILATATION & CURETTAGE/HYSTEROSCOPY WITH MYOSURE N/A 08/07/2016   Procedure: HYSTEROSCOPY WITH MYOSURE;  Surgeon: Megan Salon, MD;  Location: [facility];  Service: Gynecology;  Laterality: N/A;  Fibroid resection  . IUD REMOVAL N/A 08/07/2016   Procedure: INTRAUTERINE DEVICE (IUD) REMOVAL;  Surgeon: Megan Salon, MD;  Location: [facility];   Service: Gynecology;  Laterality: N/A;  . IUD REMOVAL N/A 05/11/2017   Procedure: INTRAUTERINE DEVICE (IUD) REMOVAL;  Surgeon: Megan Salon, MD;  Location: [facility];  Service: Gynecology;  Laterality: N/A;  . LAPAROSCOPIC LYSIS OF ADHESIONS  05/11/2017   Procedure: LAPAROSCOPIC LYSIS OF ADHESIONS;  Surgeon: Megan Salon, MD;  Location: [facility];  Service: Gynecology;;  with cautery of endometroisis  . LAPAROSCOPY FOR ECTOPIC PREGNANCY  2000   RIGHT SALPINGECTOMY  . ROBOT ASSISTED MYOMECTOMY N/A 12/16/2012   Procedure: ROBOTIC ASSISTED MYOMECTOMY;  Surgeon: Governor Specking, MD;  Location: [facility];  Service: Gynecology;  Laterality: N/A;  . ROBOTIC ASSISTED LAPAROSCOPIC LYSIS OF ADHESION  12/16/2012   Procedure: ROBOTIC ASSISTED LAPAROSCOPIC LYSIS OF ADHESION;  Surgeon: Governor Specking, MD;  Location: [facility];  Service: Gynecology;;  with excision of perotoneal lesions  . TOTAL LAPAROSCOPIC HYSTERECTOMY WITH SALPINGECTOMY Bilateral 05/11/2017   Procedure: HYSTERECTOMY TOTAL LAPAROSCOPIC WITH LEFT SALPINGECTOMY;  Surgeon: Megan Salon, MD;  Location: [facility];  Service: Gynecology;  Laterality: Bilateral;  . WISDOM TOOTH EXTRACTION     Medication List:  Current Outpatient Medications  Medication Sig Dispense Refill  . albuterol (PROVENTIL HFA;VENTOLIN HFA) 108 (90 BASE) MCG/ACT inhaler Inhale 2 puffs into the lungs every 6 (six) hours as needed for wheezing or shortness of breath.     . ALPRAZolam (XANAX) 0.25 MG tablet Take 0.25 mg by mouth daily as needed for anxiety.     . cetirizine (ZYRTEC) 10 MG tablet Take 10 mg by mouth daily.    . Ferrous Fumarate (HEMOCYTE - 106 MG FE) 324 (106 Fe) MG TABS tablet Take 1 tablet by mouth daily.    Marland Kitchen gabapentin (NEURONTIN) 100 MG capsule Take 2 capsules (200 mg total) by mouth at bedtime.  180 capsule 0  . ibuprofen (ADVIL,MOTRIN) 800 MG tablet Take 1 tablet (800 mg total) by mouth every 8 (eight) hours as needed. (Patient taking differently: Take 800 mg by  mouth every 8 (eight) hours as needed for moderate pain. ) 30 tablet 1  . Vitamin D, Ergocalciferol, (DRISDOL) 1.25 MG (50000 UT) CAPS capsule TAKE ONE CAPSULE BY MOUTH EVERY 7 DAYS 12 capsule 3  . Azelastine-Fluticasone 137-50 MCG/ACT SUSP Place 1 spray into the nose in the morning and at bedtime. 23 g 5  . EPINEPHrine 0.3 mg/0.3 mL IJ SOAJ injection Inject 0.3 mLs (0.3 mg total) into the muscle as needed for anaphylaxis. 1 each 2  . montelukast (SINGULAIR) 10 MG tablet Take 1 tablet (10 mg total) by mouth at bedtime. 30 tablet 5  . Olopatadine HCl 0.2 % SOLN Apply 1 drop to eye daily as needed (itchy/watery eyes). 2.5 mL 5   No current facility-administered medications for this visit.   Allergies: Allergies  Allergen Reactions  . Latex Other (See Comments)    DERMITITIS  . Other Hives and Swelling    PORK (alphagal)-lip/tongue swelling, hives  . Tramadol Nausea And Vomiting   Social History: Social History   Socioeconomic History  . Marital status: Single    Spouse name: Not on file  . Number of children: 0  . Years of education: Not on file  . Highest education level: Not on file  Occupational History  . Occupation: Engineer, mining at [employer]: [place]  Tobacco Use  . Smoking status: Never Smoker  . Smokeless tobacco: Never Used  Vaping Use  . Vaping Use: Never used  Substance and Sexual Activity  . Alcohol use: Yes    Alcohol/week: 0.0 standard drinks    Comment: OCCASIONAL  . Drug use: No  . Sexual activity: Yes    Partners: Male    Birth control/protection: Surgical    Comment: [place] 05/11/17   Other Topics Concern  . Not on file  Social History Narrative   Lives at home with her sister   Right handed   Caffeine: 6 cups daily   Social Determinants of Health   Financial Resource Strain:   . Difficulty of Paying Living Expenses:   Food Insecurity:   . Worried About Charity fundraiser in the Last Year:   . Arboriculturist in the Last  Year:   Transportation Needs:   . Film/video editor (Medical):   Marland Kitchen Lack of Transportation (Non-Medical):   Physical Activity:   . Days of Exercise per Week:   . Minutes of Exercise per Session:   Stress:   . Feeling of Stress :   Social Connections:   . Frequency of Communication with Friends and Family:   . Frequency of Social Gatherings with Friends and Family:   . Attends Religious Services:   . Active Member of Clubs or Organizations:   . Attends Archivist Meetings:   Marland Kitchen Marital Status:    Lives in a townhome which is 43 years old. Smoking: denies Occupation: Armed forces technical officer HistoryFreight forwarder in the house: no Carpet in the family room: no Carpet in the bedroom: yes Heating: gas Cooling: central Pet: no  Family History: Family History  Adopted: Yes  Problem Relation Age of Onset  . Allergies Mother        food  . Hypertension Mother   . Hyperlipidemia Mother   . Heart failure Mother   .  Thyroid disease Mother   . Diabetes Mother   . Lung cancer Father   . Diabetes Sister   . Hypertension Sister   . Renal Disease Sister        dialysis  . Uterine cancer Paternal Grandmother   . Breast cancer Neg Hx    Review of Systems  Constitutional: Negative for appetite change, chills, fever and unexpected weight change.  HENT: Positive for congestion and rhinorrhea.   Eyes: Negative for itching.  Respiratory: Negative for cough, chest tightness, shortness of breath and wheezing.   Cardiovascular: Negative for chest pain.  Gastrointestinal: Negative for abdominal pain.  Genitourinary: Negative for difficulty urinating.  Skin: Negative for rash.  Allergic/Immunologic: Positive for environmental allergies and food allergies.  Neurological: Negative for headaches.   Objective: BP 116/78 (BP Location: Right Arm, Patient Position: Sitting, Cuff Size: Normal)   Pulse 82   Temp 98.1 F (36.7 C) (Oral)   Resp 12   Ht 5\' 1"   (1.549 m)   Wt (!) 214 lb 3.2 oz (97.2 kg)   LMP 05/07/2017   SpO2 99%   BMI 40.47 kg/m  Body mass index is 40.47 kg/m. Physical Exam Vitals and nursing note reviewed.  Constitutional:      Appearance: Normal appearance. She is well-developed.  HENT:     Head: Normocephalic and atraumatic.     Right Ear: Tympanic membrane and external ear normal.     Left Ear: Tympanic membrane and external ear normal.     Nose: Congestion and rhinorrhea present.     Comments: Left side > right side    Mouth/Throat:     Mouth: Mucous membranes are moist.     Pharynx: Oropharynx is clear.  Eyes:     Conjunctiva/sclera: Conjunctivae normal.  Cardiovascular:     Rate and Rhythm: Normal rate and regular rhythm.     Heart sounds: Normal heart sounds. No murmur heard.  No friction rub. No gallop.   Pulmonary:     Effort: Pulmonary effort is normal.     Breath sounds: Normal breath sounds. No wheezing, rhonchi or rales.  Abdominal:     Palpations: Abdomen is soft.  Musculoskeletal:     Cervical back: Neck supple.  Skin:    General: Skin is warm.     Findings: No rash.  Neurological:     Mental Status: She is alert and oriented to person, place, and time.  Psychiatric:        Behavior: Behavior normal.    The plan was reviewed with the patient/family, and all questions/concerned were addressed.  It was my pleasure to see Vianny today and participate in her care. Please feel free to contact me with any questions or concerns.  Sincerely,  Rexene Alberts, DO Allergy & Immunology  [facility] office: [phone] [facility] office: [place] office: [phone]

## 2020-04-19 ENCOUNTER — Other Ambulatory Visit: Payer: Self-pay

## 2020-04-19 ENCOUNTER — Encounter: Payer: Self-pay | Admitting: Allergy

## 2020-04-19 ENCOUNTER — Ambulatory Visit: Payer: BC Managed Care – PPO | Admitting: Allergy

## 2020-04-19 VITALS — BP 116/78 | HR 82 | Temp 98.1°F | Resp 12 | Ht 61.0 in | Wt 214.2 lb

## 2020-04-19 DIAGNOSIS — H1013 Acute atopic conjunctivitis, bilateral: Secondary | ICD-10-CM | POA: Diagnosis not present

## 2020-04-19 DIAGNOSIS — Z91018 Allergy to other foods: Secondary | ICD-10-CM

## 2020-04-19 DIAGNOSIS — J3089 Other allergic rhinitis: Secondary | ICD-10-CM | POA: Diagnosis not present

## 2020-04-19 DIAGNOSIS — J4520 Mild intermittent asthma, uncomplicated: Secondary | ICD-10-CM

## 2020-04-19 MED ORDER — EPINEPHRINE 0.3 MG/0.3ML IJ SOAJ: 0.3000 mg | INTRAMUSCULAR | 2 refills | Status: DC | PRN

## 2020-04-19 MED ORDER — AZELASTINE-FLUTICASONE 137-50 MCG/ACT NA SUSP: 1.0000 | Freq: Two times a day (BID) | NASAL | 5 refills | Status: AC

## 2020-04-19 MED ORDER — MONTELUKAST SODIUM 10 MG PO TABS: 10.0000 mg | ORAL_TABLET | Freq: Every day | ORAL | 5 refills | Status: DC

## 2020-04-19 MED ORDER — OLOPATADINE HCL 0.2 % OP SOLN: 1.0000 [drp] | Freq: Every day | OPHTHALMIC | 5 refills | Status: DC | PRN

## 2020-04-19 NOTE — Patient Instructions (Addendum)
Today's skin testing showed: Positive to grass, weed, ragweed, trees, mold, dust mites, cat, dog. Negative to pork, lamb, beef.  Environmental allergies  Start environmental control measures as below.  May use over the counter antihistamines such as Zyrtec (cetirizine), Claritin (loratadine), Allegra (fexofenadine), or Xyzal (levocetirizine) daily as needed. May take twice a day if needed.  Start Singulair (montelukast) 10mg  daily at night. Cautioned that in some children/adults can experience behavioral changes including hyperactivity, agitation, depression, sleep disturbances and suicidal ideations. These side effects are rare, but if you notice them you should notify me and discontinue Singulair (montelukast). Start dymista (fluticasone + azelastine nasal spray combination) 1 spray per nostril twice a day. If it's not covered let us know.   Nasal saline spray (i.e., Simply Saline) or nasal saline lavage (i.e., NeilMed) is recommended as needed and prior to medicated nasal sprays.  May use olopatadine eye drops 0.2% once a day as needed for itchy/watery eyes.  Had a detailed discussion with patient/family that clinical history is suggestive of allergic rhinitis, and may benefit from allergy immunotherapy (AIT). Discussed in detail regarding the dosing, schedule, side effects (mild to moderate local allergic reaction and rarely systemic allergic reactions including anaphylaxis), and benefits (significant improvement in nasal symptoms, seasonal flares of asthma) of immunotherapy with the patient. There is significant time commitment involved with allergy shots, which includes weekly immunotherapy injections for first 9-12 months and then biweekly to monthly injections for 3-5 years.   Read about allergy injections. Let us know when ready to start.   Breathing:  Your breathing test was normal today. . May use albuterol rescue inhaler 2 puffs every 4 to 6 hours as needed for shortness of  breath, chest tightness, coughing, and wheezing. May use albuterol rescue inhaler 2 puffs 5 to 15 minutes prior to strenuous physical activities. Monitor frequency of use.  . Breathing control goals:  o Full participation in all desired activities (may need albuterol before activity) o Albuterol use two times or less a week on average (not counting use with activity) o Cough interfering with sleep two times or less a month o Oral steroids no more than once a year o No hospitalizations  Food:  Continue to avoid red meat.  I have prescribed epinephrine injectable and demonstrated proper use. For mild symptoms you can take over the counter antihistamines such as Benadryl and monitor symptoms closely. If symptoms worsen or if you have severe symptoms including breathing issues, throat closure, significant swelling, whole body hives, severe diarrhea and vomiting, lightheadedness then inject epinephrine and seek immediate medical care afterwards.  Food action plan given.  Get alpha gal bloodwork when able to.    Follow up in 3 months or sooner if needed.    After September 2021, I will not be in the [facility] office on a regular basis.   You can continue your care and follow up with Dr. Verlin Fester or nurse practitioner Gareth Morgan in [facility].  OR you can follow up with me in our [place] ([address]) or [place] [phone]) location.  Sincerely,  Rexene Alberts, DO  [facility] [place] [facility] office: [phone] [facility] office: [place] office: [phone]   Reducing Pollen Exposure . Pollen seasons: trees (spring), grass (summer) and ragweed/weeds (fall). Marland Kitchen Keep windows closed in your home and car to lower pollen exposure.  Susa Simmonds air conditioning in the bedroom and throughout the house if possible.  Marland Kitchen  Avoid going out in dry windy days - especially early morning. . Pollen counts are highest between 5 - 10 AM and on  dry, hot and windy days.  . Save outside activities for late afternoon or after a heavy rain, when pollen levels are lower.  . Avoid mowing of grass if you have grass pollen allergy. Marland Kitchen Be aware that pollen can also be transported indoors on people and pets.  . Dry your clothes in an automatic dryer rather than hanging them outside where they might collect pollen.  . Rinse hair and eyes before bedtime.  Mold Control . Mold and fungi can grow on a variety of surfaces provided certain temperature and moisture conditions exist.  . Outdoor molds grow on plants, decaying vegetation and soil. The major outdoor mold, Alternaria and Cladosporium, are found in very high numbers during hot and dry conditions. Generally, a late summer - fall peak is seen for common outdoor fungal spores. Rain will temporarily lower outdoor mold spore count, but counts rise rapidly when the rainy period ends. . The most important indoor molds are Aspergillus and Penicillium. Dark, humid and poorly ventilated basements are ideal sites for mold growth. The next most common sites of mold growth are the bathroom and the kitchen. Outdoor (Seasonal) Mold Control . Use air conditioning and keep windows closed. . Avoid exposure to decaying vegetation. Marland Kitchen Avoid leaf raking. . Avoid grain handling. . Consider wearing a face mask if working in moldy areas.  Indoor (Perennial) Mold Control  . Maintain humidity below 50%. . Get rid of mold growth on hard surfaces with water, detergent and, if necessary, 5% bleach (do not mix with other cleaners). Then dry the area completely. If mold covers an area more than 10 square feet, consider hiring an indoor environmental professional. . For clothing, washing with soap and water is best. If moldy items cannot be cleaned and dried, throw them away. . Remove sources e.g. contaminated carpets. . Repair and seal leaking roofs or pipes. Using dehumidifiers in damp basements may be helpful, but empty  the water and clean units regularly to prevent mildew from forming. All rooms, especially basements, bathrooms and kitchens, require ventilation and cleaning to deter mold and mildew growth. Avoid carpeting on concrete or damp floors, and storing items in damp areas. Control of House Dust Mite Allergen . Dust mite allergens are a common trigger of allergy and asthma symptoms. While they can be found throughout the house, these microscopic creatures thrive in warm, humid environments such as bedding, upholstered furniture and carpeting. . Because so much time is spent in the bedroom, it is essential to reduce mite levels there.  . Encase pillows, mattresses, and box springs in special allergen-proof fabric covers or airtight, zippered plastic covers.  . Bedding should be washed weekly in hot water (130 F) and dried in a hot dryer. Allergen-proof covers are available for comforters and pillows that can't be regularly washed.  Wendee Copp the allergy-proof covers every few months. Minimize clutter in the bedroom. Keep pets out of the bedroom.  Marland Kitchen Keep humidity less than 50% by using a dehumidifier or air conditioning. You can buy a humidity measuring device called a hygrometer to monitor this.  . If possible, replace carpets with hardwood, linoleum, or washable area rugs. If that's not possible, vacuum frequently with a vacuum that has a HEPA filter. . Remove all upholstered furniture and non-washable window drapes from the bedroom. . Remove all non-washable stuffed toys from the bedroom.  Wash stuffed toys weekly. Pet Allergen Avoidance: . Contrary to popular opinion, there are no "hypoallergenic" breeds of dogs or cats. That is because people are not allergic to an animal's hair, but to an allergen found in the animal's saliva, dander (dead skin flakes) or urine. Pet allergy symptoms typically occur within minutes. For some people, symptoms can build up and become most severe 8 to 12 hours after contact with  the animal. People with severe allergies can experience reactions in public places if dander has been transported on the pet owners' clothing. Marland Kitchen Keeping an animal outdoors is only a partial solution, since homes with pets in the yard still have higher concentrations of animal allergens. . Before getting a pet, ask your allergist to determine if you are allergic to animals. If your pet is already considered part of your family, try to minimize contact and keep the pet out of the bedroom and other rooms where you spend a great deal of time. . As with dust mites, vacuum carpets often or replace carpet with a hardwood floor, tile or linoleum. . High-efficiency particulate air (HEPA) cleaners can reduce allergen levels over time. . While dander and saliva are the source of cat and dog allergens, urine is the source of allergens from rabbits, hamsters, mice and [place] pigs; so ask a non-allergic family member to clean the animal's cage. . If you have a pet allergy, talk to your allergist about the potential for allergy immunotherapy (allergy shots). This strategy can often provide long-term relief.

## 2020-04-21 DIAGNOSIS — Z91018 Allergy to other foods: Secondary | ICD-10-CM | POA: Insufficient documentation

## 2020-04-21 DIAGNOSIS — H1013 Acute atopic conjunctivitis, bilateral: Secondary | ICD-10-CM | POA: Insufficient documentation

## 2020-04-21 NOTE — Assessment & Plan Note (Signed)
   See assessment and plan as above for allergic rhinitis.  

## 2020-04-21 NOTE — Assessment & Plan Note (Signed)
Diagnosed with alpha gal allergy 3 years ago and no recent bloodwork. No recent tick bites.  Continue to avoid red meat.  I have prescribed epinephrine injectable and demonstrated proper use. For mild symptoms you can take over the counter antihistamines such as Benadryl and monitor symptoms closely. If symptoms worsen or if you have severe symptoms including breathing issues, throat closure, significant swelling, whole body hives, severe diarrhea and vomiting, lightheadedness then inject epinephrine and seek immediate medical care afterwards.  Food action plan given.  Get alpha gal bloodwork when able to.

## 2020-04-21 NOTE — Assessment & Plan Note (Signed)
Episodes of chest tightness, coughing and wheezing for a few years. Main triggers allergies, weather changes and pets. Uses albuterol less than once a week with good benefit.  Today's spirometry was normal. . May use albuterol rescue inhaler 2 puffs every 4 to 6 hours as needed for shortness of breath, chest tightness, coughing, and wheezing. May use albuterol rescue inhaler 2 puffs 5 to 15 minutes prior to strenuous physical activities. Monitor frequency of use.

## 2020-04-21 NOTE — Assessment & Plan Note (Signed)
Perennial rhino conjunctivitis symptoms for the past 4 years. Tried multiple OTC antihistamines, Flonase with minimal benefit. 2013 skin testing showed positive to grass, weed, ragweed, trees, mold, dust mites, cat, dog, horse. On build up AIT in the past with unchanged symptoms - never reached maintenance.  Today's skin testing showed: Positive to grass, weed, ragweed, trees, mold, dust mites, cat, dog.  Start environmental control measures as below.  May use over the counter antihistamines such as Zyrtec (cetirizine), Claritin (loratadine), Allegra (fexofenadine), or Xyzal (levocetirizine) daily as needed. May take twice a day if needed.  Start Singulair (montelukast) 10mg  daily at night. Cautioned that in some children/adults can experience behavioral changes including hyperactivity, agitation, depression, sleep disturbances and suicidal ideations. These side effects are rare, but if you notice them you should notify me and discontinue Singulair (montelukast). Start dymista (fluticasone + azelastine nasal spray combination) 1 spray per nostril twice a day. If it's not covered let us know.   Nasal saline spray (i.e., Simply Saline) or nasal saline lavage (i.e., NeilMed) is recommended as needed and prior to medicated nasal sprays.  May use olopatadine eye drops 0.2% once a day as needed for itchy/watery eyes.  Had a detailed discussion with patient/family that clinical history is suggestive of allergic rhinitis, and may benefit from allergy immunotherapy (AIT). Discussed in detail regarding the dosing, schedule, side effects (mild to moderate local allergic reaction and rarely systemic allergic reactions including anaphylaxis), and benefits (significant improvement in nasal symptoms, seasonal flares of asthma) of immunotherapy with the patient. There is significant time commitment involved with allergy shots, which includes weekly immunotherapy injections for first 9-12 months and then biweekly to  monthly injections for 3-5 years.   Read about allergy injections. Let us know when ready to start.

## 2020-04-23 ENCOUNTER — Ambulatory Visit (INDEPENDENT_AMBULATORY_CARE_PROVIDER_SITE_OTHER): Payer: BC Managed Care – PPO

## 2020-04-23 ENCOUNTER — Encounter: Payer: Self-pay | Admitting: Podiatry

## 2020-04-23 ENCOUNTER — Ambulatory Visit: Payer: BC Managed Care – PPO | Admitting: Podiatry

## 2020-04-23 ENCOUNTER — Other Ambulatory Visit: Payer: Self-pay

## 2020-04-23 ENCOUNTER — Other Ambulatory Visit: Payer: Self-pay | Admitting: Podiatry

## 2020-04-23 VITALS — BP 109/64 | HR 94 | Temp 97.4°F | Resp 16

## 2020-04-23 DIAGNOSIS — M722 Plantar fascial fibromatosis: Secondary | ICD-10-CM

## 2020-04-23 DIAGNOSIS — M25572 Pain in left ankle and joints of left foot: Secondary | ICD-10-CM

## 2020-04-23 DIAGNOSIS — Z0000 Encounter for general adult medical examination without abnormal findings: Secondary | ICD-10-CM | POA: Diagnosis not present

## 2020-04-23 DIAGNOSIS — M779 Enthesopathy, unspecified: Secondary | ICD-10-CM

## 2020-04-23 DIAGNOSIS — Z131 Encounter for screening for diabetes mellitus: Secondary | ICD-10-CM | POA: Diagnosis not present

## 2020-04-23 MED ORDER — MELOXICAM 15 MG PO TABS: 15.0000 mg | ORAL_TABLET | Freq: Every day | ORAL | 3 refills | Status: DC

## 2020-04-23 MED ORDER — METHYLPREDNISOLONE 4 MG PO TBPK: ORAL_TABLET | ORAL | 0 refills | Status: DC

## 2020-04-23 NOTE — Patient Instructions (Signed)

## 2020-04-24 NOTE — Progress Notes (Signed)
Subjective:  Patient ID: Lacey Lee, female    DOB: 01-31-1976,  MRN: [id number] HPI Chief Complaint  Patient presents with   Foot Pain    Left foot; bottom of heel; pt stated "Hurts more in the morning"; x3 weeks   Ankle Pain    Left foot; Ankle-lateral side; pt stated, "started hurting yesterday; has no injuries"; 1 day    44 y.o. female presents with the above complaint.   ROS: Denies fever chills nausea vomiting muscle aches pains calf pain back pain chest pain shortness of breath.    Past Medical History:  Diagnosis Date   Anxiety    slight   Asthma    albuterol rescue inhaler-uses prn, exacerbated by pet dander   History of ectopic pregnancy 1999   S/P RIGHT SALPINGECTOMY   History of herpes simplex type 2 infection 2005   History of uterine fibroid    Neurofibromatosis ([place])    dx is child--  nonmalignant   Pneumonia    years ago   Seasonal allergies    Past Surgical History:  Procedure Laterality Date   ABDOMINAL HYSTERECTOMY     CYSTOSCOPY N/A 05/11/2017   Procedure: CYSTOSCOPY;  Surgeon: Megan Salon, MD;  Location: [facility];  Service: Gynecology;  Laterality: N/A;   DILATATION & CURETTAGE/HYSTEROSCOPY WITH MYOSURE N/A 08/07/2016   Procedure: HYSTEROSCOPY WITH MYOSURE;  Surgeon: Megan Salon, MD;  Location: [facility];  Service: Gynecology;  Laterality: N/A;  Fibroid resection   IUD REMOVAL N/A 08/07/2016   Procedure: INTRAUTERINE DEVICE (IUD) REMOVAL;  Surgeon: Megan Salon, MD;  Location: [facility];  Service: Gynecology;  Laterality: N/A;   IUD REMOVAL N/A 05/11/2017   Procedure: INTRAUTERINE DEVICE (IUD) REMOVAL;  Surgeon: Megan Salon, MD;  Location: [facility];  Service: Gynecology;  Laterality: N/A;   LAPAROSCOPIC LYSIS OF ADHESIONS  05/11/2017   Procedure: LAPAROSCOPIC LYSIS OF ADHESIONS;  Surgeon: Megan Salon, MD;  Location: [facility];  Service: Gynecology;;  with cautery of endometroisis    LAPAROSCOPY FOR ECTOPIC PREGNANCY  2000   RIGHT SALPINGECTOMY   ROBOT ASSISTED MYOMECTOMY N/A 12/16/2012   Procedure: ROBOTIC ASSISTED MYOMECTOMY;  Surgeon: Governor Specking, MD;  Location: [facility];  Service: Gynecology;  Laterality: N/A;   ROBOTIC ASSISTED LAPAROSCOPIC LYSIS OF ADHESION  12/16/2012   Procedure: ROBOTIC ASSISTED LAPAROSCOPIC LYSIS OF ADHESION;  Surgeon: Governor Specking, MD;  Location: [facility];  Service: Gynecology;;  with excision of perotoneal lesions   TOTAL LAPAROSCOPIC HYSTERECTOMY WITH SALPINGECTOMY Bilateral 05/11/2017   Procedure: HYSTERECTOMY TOTAL LAPAROSCOPIC WITH LEFT SALPINGECTOMY;  Surgeon: Megan Salon, MD;  Location: [facility];  Service: Gynecology;  Laterality: Bilateral;   WISDOM TOOTH EXTRACTION      Current Outpatient Medications:    albuterol (PROVENTIL HFA;VENTOLIN HFA) 108 (90 BASE) MCG/ACT inhaler, Inhale 2 puffs into the lungs every 6 (six) hours as needed for wheezing or shortness of breath. , Disp: , Rfl:    ALPRAZolam (XANAX) 0.25 MG tablet, Take 0.25 mg by mouth daily as needed for anxiety. , Disp: , Rfl:    Azelastine-Fluticasone 137-50 MCG/ACT SUSP, Place 1 spray into the nose in the morning and at bedtime., Disp: 23 g, Rfl: 5   cetirizine (ZYRTEC) 10 MG tablet, Take 10 mg by mouth daily., Disp: , Rfl:    EPINEPHrine 0.3 mg/0.3 mL IJ SOAJ injection, Inject 0.3 mLs (0.3 mg total) into the muscle as needed for anaphylaxis., Disp: 1 each, Rfl: 2  Ferrous Fumarate (HEMOCYTE - 106 MG FE) 324 (106 Fe) MG TABS tablet, Take 1 tablet by mouth daily., Disp: , Rfl:    gabapentin (NEURONTIN) 100 MG capsule, Take 2 capsules (200 mg total) by mouth at bedtime., Disp: 180 capsule, Rfl: 0   ibuprofen (ADVIL,MOTRIN) 800 MG tablet, Take 1 tablet (800 mg total) by mouth every 8 (eight) hours as needed. (Patient taking differently: Take 800 mg by mouth every 8 (eight) hours as needed for moderate pain. ), Disp: 30 tablet, Rfl: 1   montelukast (SINGULAIR) 10 MG  tablet, Take 1 tablet (10 mg total) by mouth at bedtime., Disp: 30 tablet, Rfl: 5   Olopatadine HCl 0.2 % SOLN, Apply 1 drop to eye daily as needed (itchy/watery eyes)., Disp: 2.5 mL, Rfl: 5   Vitamin D, Ergocalciferol, (DRISDOL) 1.25 MG (50000 UT) CAPS capsule, TAKE ONE CAPSULE BY MOUTH EVERY 7 DAYS, Disp: 12 capsule, Rfl: 3   meloxicam (MOBIC) 15 MG tablet, Take 1 tablet (15 mg total) by mouth daily., Disp: 30 tablet, Rfl: 3   methylPREDNISolone (MEDROL DOSEPAK) 4 MG TBPK tablet, 6 day dose pack - take as directed, Disp: 21 tablet, Rfl: 0  Allergies  Allergen Reactions   Latex Other (See Comments)    DERMITITIS   Other Hives and Swelling    PORK (alphagal)-lip/tongue swelling, hives   Tramadol Nausea And Vomiting   Review of Systems Objective:   Vitals:   04/23/20 1633  BP: (!) 109/64  Pulse: 94  Resp: 16  Temp: (!) 97.4 F (36.3 C)    General: Well developed, nourished, in no acute distress, alert and oriented x3   Dermatological: Skin is warm, dry and supple bilateral. Nails x 10 are well maintained; remaining integument appears unremarkable at this time. There are no open sores, no preulcerative lesions, no rash or signs of infection present.  Vascular: Dorsalis Pedis artery and Posterior Tibial artery pedal pulses are 2/4 bilateral with immedate capillary fill time. Pedal hair growth present. No varicosities and no lower extremity edema present bilateral.   Neruologic: Grossly intact via light touch bilateral. Vibratory intact via tuning fork bilateral. Protective threshold with Semmes Wienstein monofilament intact to all pedal sites bilateral. Patellar and Achilles deep tendon reflexes 2+ bilateral. No Babinski or clonus noted bilateral.   Musculoskeletal: No gross boney pedal deformities bilateral. No pain, crepitus, or limitation noted with foot and ankle range of motion bilateral. Muscular strength 5/5 in all groups tested bilateral.  She has pain on palpation  medial calcaneal tubercle of the left heel.  No pain on medial and lateral compression of the calcaneus.  Gait: Unassisted, Nonantalgic.    Radiographs:  Radiographs taken today left foot and left ankle demonstrate soft tissue increase in density plantar fashion calcaneal insertion site only.  No acute findings are noted no problems with the ankle.  Assessment & Plan:   Assessment: Plantar fasciitis lateral compensatory ankle pain.  Plan: Discussed etiology pathology conservative surgical therapies at this point started her on methylprednisolone to be followed by meloxicam.  I injected the left heel today 20 mg Kenalog 5 mg Marcaine point maximal tenderness.  Placed her plantar fascial brace to be followed by a night splint.  Discussed appropriate shoe gear stretching exercise ice therapy shoe gear modifications the possible need for orthotics.  And I will follow-up with her in 1 month     Chosen Garron T. [place], [place]

## 2020-05-28 ENCOUNTER — Ambulatory Visit: Payer: BC Managed Care – PPO | Admitting: Podiatry

## 2020-06-24 NOTE — Progress Notes (Signed)
44 y.o. G1P0010 Single Black or [place] American female here for annual exam.  Doing well.  Denies vaginal bleeding.  Extended family has been well this past year.    Denies vaginal bleeding.    Sister passed due to infection from amputations about two years ago.    Patient's last menstrual period was 05/07/2017.          Sexually active: Yes.    The current method of family planning is status post hysterectomy.    Exercising: No.  exercise Smoker:  no  Health Maintenance: Pap:  11-19-15 neg HPV HR neg History of abnormal Pap:  yes MMG:  03-28-2020 category b density birads 1:neg Colonoscopy:  None.  Guidelines reviewed.   BMD:   none TDaP:  2012 Pneumonia vaccine(s):  no Shingrix:   no Hep C testing: neg 2021 Screening Labs: iron, CBC, and Vit D   reports that she has never smoked. She has never used smokeless tobacco. She reports previous alcohol use. She reports that she does not use drugs.  Past Medical History:  Diagnosis Date  . Anxiety    slight  . Asthma    albuterol rescue inhaler-uses prn, exacerbated by pet dander  . History of ectopic pregnancy 1999   S/P RIGHT SALPINGECTOMY  . History of herpes simplex type 2 infection 2005  . History of uterine fibroid   . Neurofibromatosis [facility])    dx is child--  nonmalignant  . Pneumonia    years ago  . Seasonal allergies     Past Surgical History:  Procedure Laterality Date  . ABDOMINAL HYSTERECTOMY    . CYSTOSCOPY N/A 05/11/2017   Procedure: CYSTOSCOPY;  Surgeon: Megan Salon, MD;  Location: [facility];  Service: Gynecology;  Laterality: N/A;  . DILATATION & CURETTAGE/HYSTEROSCOPY WITH MYOSURE N/A 08/07/2016   Procedure: HYSTEROSCOPY WITH MYOSURE;  Surgeon: Megan Salon, MD;  Location: [facility];  Service: Gynecology;  Laterality: N/A;  Fibroid resection  . IUD REMOVAL N/A 08/07/2016   Procedure: INTRAUTERINE DEVICE (IUD) REMOVAL;  Surgeon: Megan Salon, MD;  Location: [facility];   Service: Gynecology;  Laterality: N/A;  . IUD REMOVAL N/A 05/11/2017   Procedure: INTRAUTERINE DEVICE (IUD) REMOVAL;  Surgeon: Megan Salon, MD;  Location: [facility];  Service: Gynecology;  Laterality: N/A;  . LAPAROSCOPIC LYSIS OF ADHESIONS  05/11/2017   Procedure: LAPAROSCOPIC LYSIS OF ADHESIONS;  Surgeon: Megan Salon, MD;  Location: [facility];  Service: Gynecology;;  with cautery of endometroisis  . LAPAROSCOPY FOR ECTOPIC PREGNANCY  2000   RIGHT SALPINGECTOMY  . ROBOT ASSISTED MYOMECTOMY N/A 12/16/2012   Procedure: ROBOTIC ASSISTED MYOMECTOMY;  Surgeon: Governor Specking, MD;  Location: [facility];  Service: Gynecology;  Laterality: N/A;  . ROBOTIC ASSISTED LAPAROSCOPIC LYSIS OF ADHESION  12/16/2012   Procedure: ROBOTIC ASSISTED LAPAROSCOPIC LYSIS OF ADHESION;  Surgeon: Governor Specking, MD;  Location: [facility];  Service: Gynecology;;  with excision of perotoneal lesions  . TOTAL LAPAROSCOPIC HYSTERECTOMY WITH SALPINGECTOMY Bilateral 05/11/2017   Procedure: HYSTERECTOMY TOTAL LAPAROSCOPIC WITH LEFT SALPINGECTOMY;  Surgeon: Megan Salon, MD;  Location: [facility];  Service: Gynecology;  Laterality: Bilateral;  . WISDOM TOOTH EXTRACTION      Current Outpatient Medications  Medication Sig Dispense Refill  . albuterol (PROVENTIL HFA;VENTOLIN HFA) 108 (90 BASE) MCG/ACT inhaler Inhale 2 puffs into the lungs every 6 (six) hours as needed for wheezing or shortness of breath.     . ALPRAZolam (XANAX) 0.25 MG tablet  Take 0.25 mg by mouth daily as needed for anxiety.     . Azelastine-Fluticasone 137-50 MCG/ACT SUSP Place 1 spray into the nose in the morning and at bedtime. 23 g 5  . cetirizine (ZYRTEC) 10 MG tablet Take 10 mg by mouth daily.    . Ferrous Fumarate (HEMOCYTE - 106 MG FE) 324 (106 Fe) MG TABS tablet Take 1 tablet by mouth daily.    Marland Kitchen ibuprofen (ADVIL,MOTRIN) 800 MG tablet Take 1 tablet (800 mg total) by mouth every 8 (eight) hours as needed. (Patient taking differently: Take 800 mg by mouth every 8  (eight) hours as needed for moderate pain. ) 30 tablet 1  . montelukast (SINGULAIR) 10 MG tablet Take 1 tablet (10 mg total) by mouth at bedtime. 30 tablet 5  . Olopatadine HCl 0.2 % SOLN Apply 1 drop to eye daily as needed (itchy/watery eyes). 2.5 mL 5  . Vitamin D, Ergocalciferol, (DRISDOL) 1.25 MG (50000 UT) CAPS capsule TAKE ONE CAPSULE BY MOUTH EVERY 7 DAYS 12 capsule 3  . EPINEPHrine 0.3 mg/0.3 mL IJ SOAJ injection Inject 0.3 mLs (0.3 mg total) into the muscle as needed for anaphylaxis. (Patient not taking: Reported on 06/25/2020) 1 each 2   No current facility-administered medications for this visit.    Family History  Adopted: Yes  Problem Relation Age of Onset  . Allergies Mother        food  . Hypertension Mother   . Hyperlipidemia Mother   . Heart failure Mother   . Thyroid disease Mother   . Diabetes Mother   . Lung cancer Father   . Diabetes Sister   . Hypertension Sister   . Renal Disease Sister        dialysis  . Uterine cancer Paternal Grandmother   . Breast cancer Neg Hx     Review of Systems  Constitutional: Negative.   HENT: Negative.   Eyes: Negative.   Respiratory: Negative.   Cardiovascular: Negative.   Gastrointestinal: Negative.   Endocrine: Negative.   Genitourinary: Negative.   Musculoskeletal: Negative.   Skin: Negative.   Allergic/Immunologic: Negative.   Neurological: Negative.   Hematological: Negative.   Psychiatric/Behavioral: Negative.     Exam:   BP 108/80   Pulse 68   Resp 16   Ht 5' 1.25" (1.556 m)   Wt 214 lb (97.1 kg)   LMP 05/07/2017   BMI 40.11 kg/m   Height: 5' 1.25" (155.6 cm)  General appearance: alert, cooperative and appears stated age Head: Normocephalic, without obvious abnormality, atraumatic Neck: no adenopathy, supple, symmetrical, trachea midline and thyroid normal to inspection and palpation Lungs: clear to auscultation bilaterally Breasts: normal appearance, no masses or tenderness Heart: regular rate and  rhythm Abdomen: soft, non-tender; bowel sounds normal; no masses,  no organomegaly Extremities: extremities normal, atraumatic, no cyanosis or edema Skin: increased numbers of raised firm skin lesions as well as flat pigmented lesion on torso noted today Lymph nodes: Cervical, supraclavicular, and axillary nodes normal. No abnormal inguinal nodes palpated Neurologic: Grossly normal   Pelvic: External genitalia:  no lesions              Urethra:  normal appearing urethra with no masses, tenderness or lesions              Bartholins and Skenes: normal                 Vagina: normal appearing vagina with normal color and discharge, no lesions  Cervix: absent              Pap taken: No. Bimanual Exam:  Uterus:  uterus absent              Adnexa: no mass, fullness, tenderness               Rectovaginal: Confirms               Anus:  normal sphincter tone, no lesions  Chaperone, Terence Lux, CMA, was present for exam.  A:  Well Woman with normal exam H/o TLH, left salpingectomy, LOA, cystoscopy H/o HSV 2, remote hs Neurofibromatosis (has not seen neurology recently, encouraged to have follow up) H/o iron deficiency anemia that persisted after hysterectomy  P:   Mammogram guidelines reviewed pap smear not indicated Iron, ferritin, CBC, Vit D RF for Vit D 50K weekly.  #12/4RF Colonoscopy guidelines reviewed to start after age 36 return annually or prn

## 2020-06-25 ENCOUNTER — Other Ambulatory Visit: Payer: Self-pay

## 2020-06-25 ENCOUNTER — Encounter: Payer: Self-pay | Admitting: Obstetrics & Gynecology

## 2020-06-25 ENCOUNTER — Ambulatory Visit: Payer: BC Managed Care – PPO | Admitting: Obstetrics & Gynecology

## 2020-06-25 VITALS — BP 108/80 | HR 68 | Resp 16 | Ht 61.25 in | Wt 214.0 lb

## 2020-06-25 DIAGNOSIS — D649 Anemia, unspecified: Secondary | ICD-10-CM

## 2020-06-25 DIAGNOSIS — Z01419 Encounter for gynecological examination (general) (routine) without abnormal findings: Secondary | ICD-10-CM | POA: Diagnosis not present

## 2020-06-25 DIAGNOSIS — E559 Vitamin D deficiency, unspecified: Secondary | ICD-10-CM | POA: Diagnosis not present

## 2020-06-26 LAB — CBC
Hematocrit: 40 % (ref 34.0–46.6)
Hemoglobin: 12.8 g/dL (ref 11.1–15.9)
MCH: 30.5 pg (ref 26.6–33.0)
MCHC: 32 g/dL (ref 31.5–35.7)
MCV: 95 fL (ref 79–97)
Platelets: 198 10*3/uL (ref 150–450)
RBC: 4.2 x10E6/uL (ref 3.77–5.28)
RDW: 11.8 % (ref 11.7–15.4)
WBC: 4.9 10*3/uL (ref 3.4–10.8)

## 2020-06-26 LAB — IRON,TIBC AND FERRITIN PANEL
Ferritin: 26 ng/mL (ref 15–150)
Iron Saturation: 19 % (ref 15–55)
Iron: 55 ug/dL (ref 27–159)
Total Iron Binding Capacity: 291 ug/dL (ref 250–450)
UIBC: 236 ug/dL (ref 131–425)

## 2020-06-26 LAB — VITAMIN D 25 HYDROXY (VIT D DEFICIENCY, FRACTURES): Vit D, 25-Hydroxy: 26.9 ng/mL — ABNORMAL LOW (ref 30.0–100.0)

## 2020-07-08 IMAGING — CR DG CHEST 2V
2 series · 2 of 2 positions shown · non-contrast
Comparison: December 17, 2010

CLINICAL DATA: Shortness of breath and cough.

EXAM:
CHEST - 2 VIEW

[w chest pa]
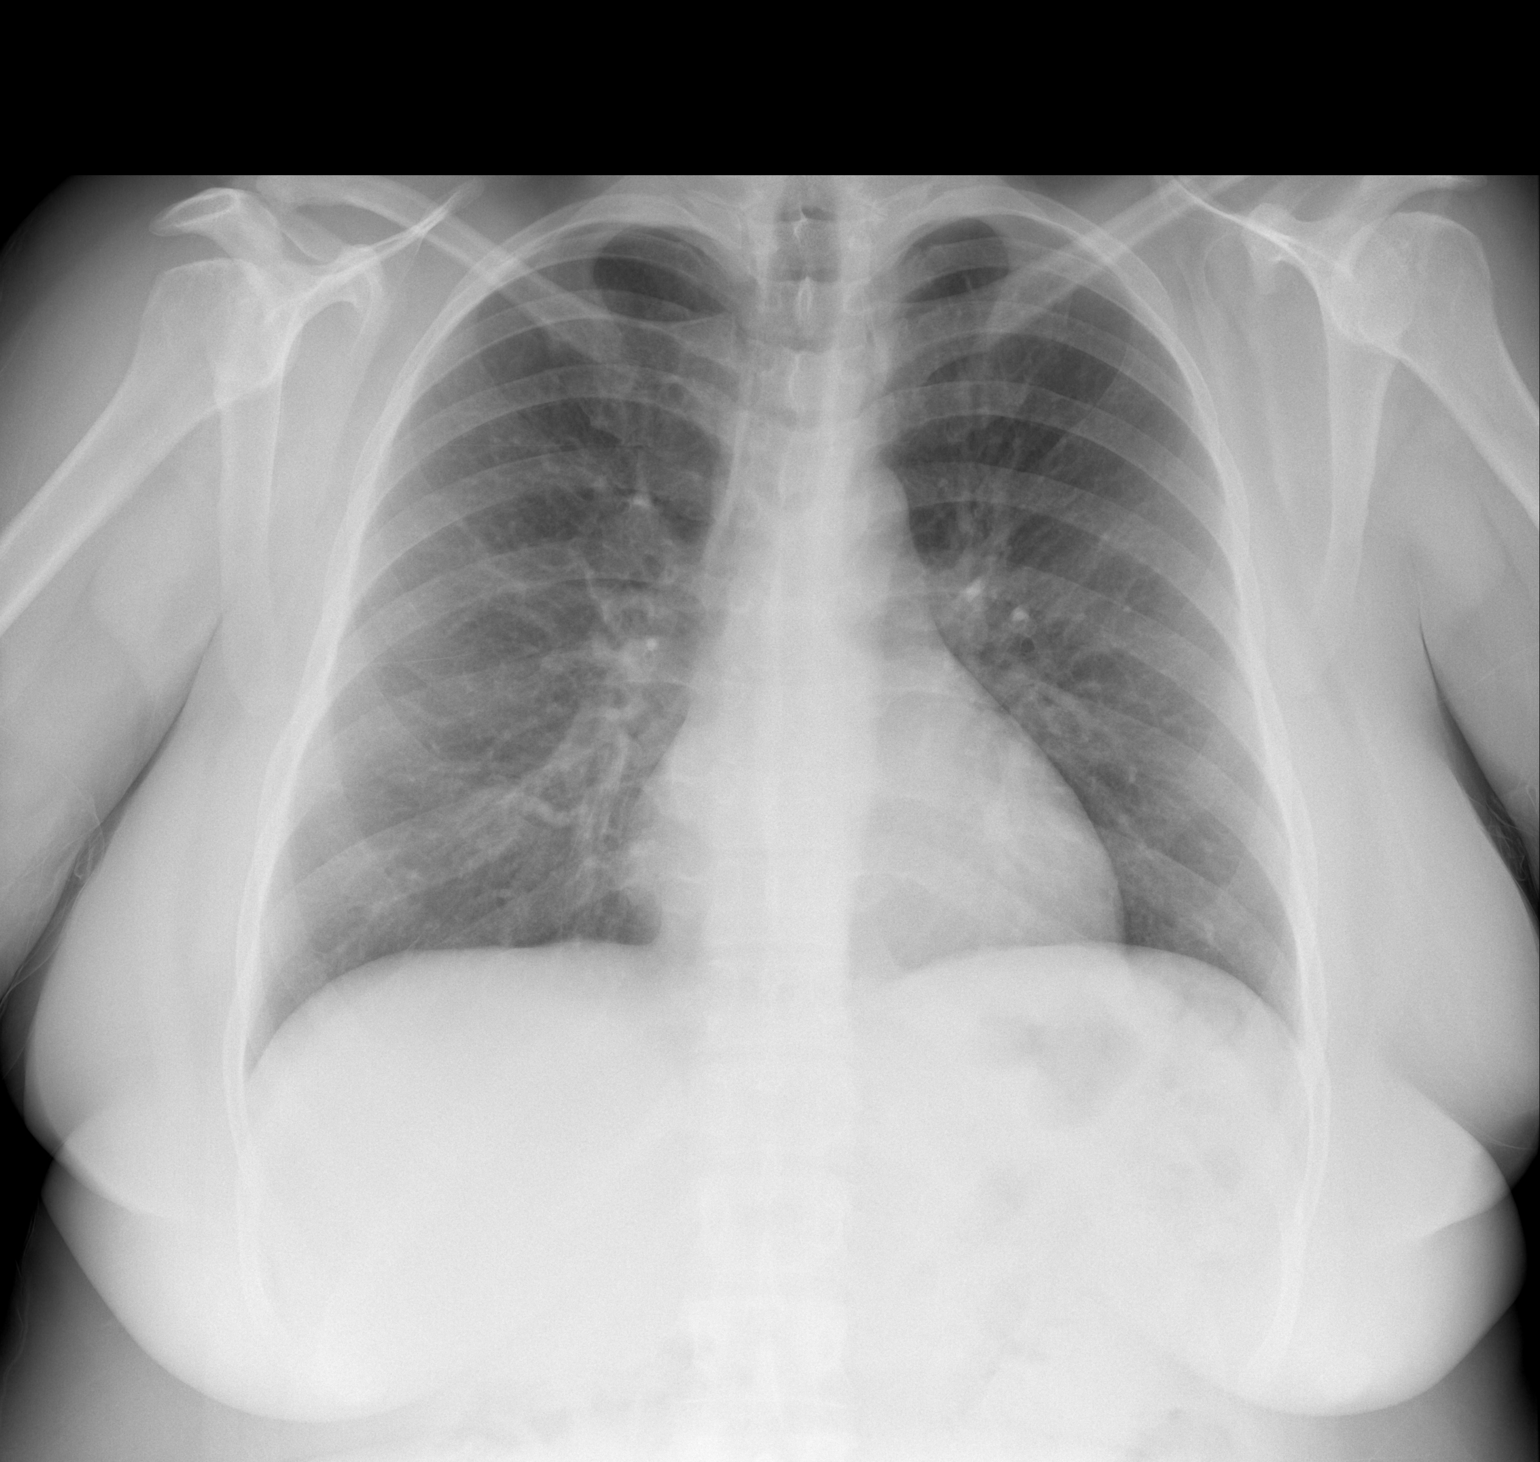

[w chest lat]
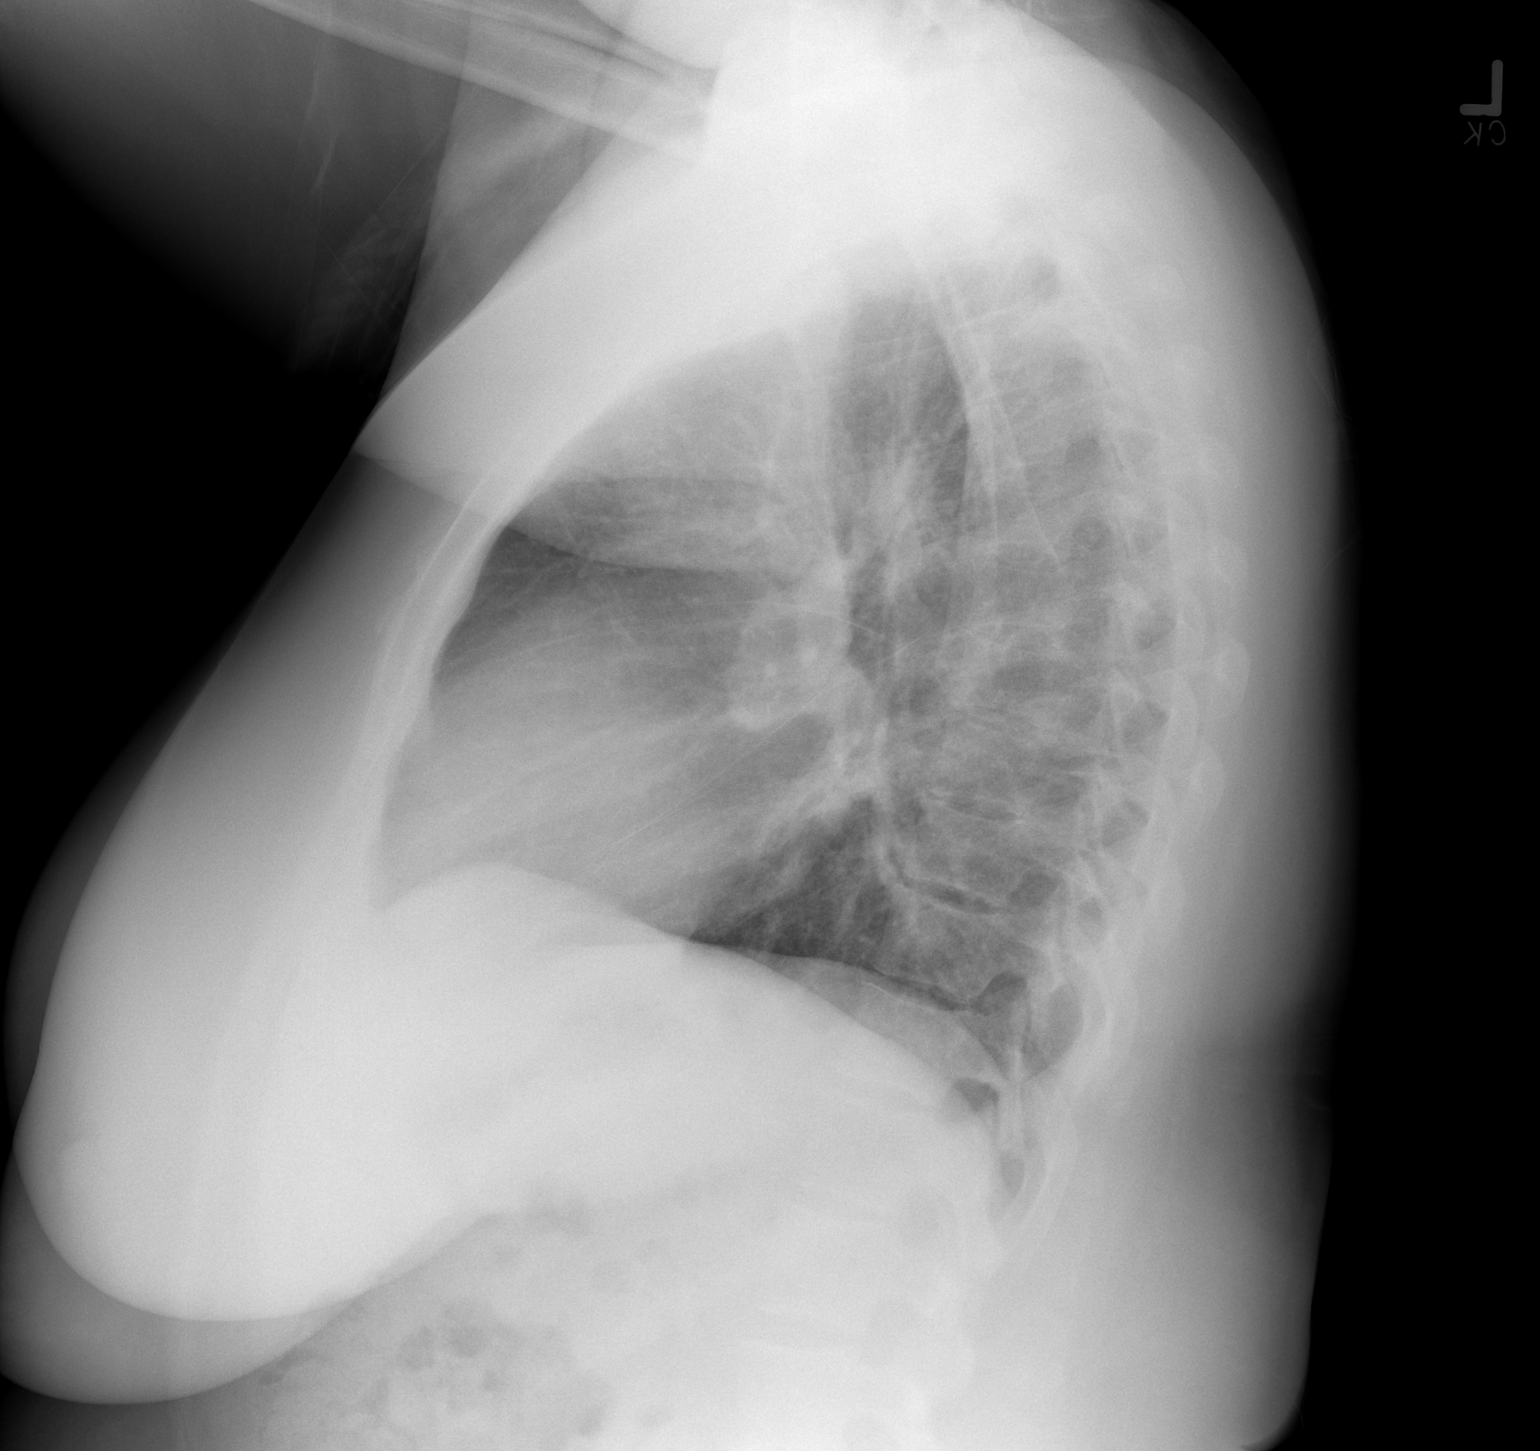

[2 of 2 positions shown; findings below may reference images not displayed]

FINDINGS: The heart size and mediastinal contours are within normal limits.
Both lungs are clear. The visualized skeletal structures are
unremarkable.
IMPRESSION: No active cardiopulmonary disease.

## 2020-07-25 ENCOUNTER — Ambulatory Visit: Payer: BC Managed Care – PPO | Admitting: Allergy and Immunology

## 2020-09-17 ENCOUNTER — Encounter (HOSPITAL_BASED_OUTPATIENT_CLINIC_OR_DEPARTMENT_OTHER): Payer: Self-pay | Admitting: Emergency Medicine

## 2020-09-17 ENCOUNTER — Other Ambulatory Visit: Payer: Self-pay

## 2020-09-17 ENCOUNTER — Emergency Department (HOSPITAL_BASED_OUTPATIENT_CLINIC_OR_DEPARTMENT_OTHER)
Admission: EM | Admit: 2020-09-17 | Discharge: 2020-09-17 | Disposition: A | Discharge status: Home / Self Care | Payer: BC Managed Care – PPO | Attending: Emergency Medicine | Admitting: Emergency Medicine

## 2020-09-17 DIAGNOSIS — Z91040 Latex allergy status: Secondary | ICD-10-CM | POA: Insufficient documentation

## 2020-09-17 DIAGNOSIS — U071 COVID-19: Secondary | ICD-10-CM | POA: Insufficient documentation

## 2020-09-17 DIAGNOSIS — J45909 Unspecified asthma, uncomplicated: Secondary | ICD-10-CM | POA: Diagnosis not present

## 2020-09-17 DIAGNOSIS — R0981 Nasal congestion: Secondary | ICD-10-CM | POA: Diagnosis not present

## 2020-09-17 LAB — SARS CORONAVIRUS 2 BY RT PCR (HOSPITAL ORDER, PERFORMED IN ~~LOC~~ HOSPITAL LAB): SARS Coronavirus 2: POSITIVE — AB

## 2020-09-17 NOTE — ED Triage Notes (Signed)
Pt reports URI symptoms x 3 days, nasal congestion , dry cough. Hx recurrent sinusitis. Chest tightness .

## 2020-09-17 NOTE — Discharge Instructions (Signed)
You have Covid and should stay home for 10 days  See your doctor for follow-up  Expect some congestion and fever and chills and cough.  Return to ER if you have trouble breathing, oxygen level less than 90%     Person Under Monitoring Name: Lacey Lee  Location: [place] [id number]   Infection Prevention Recommendations for Individuals Confirmed to have, or Being Evaluated for, 2019 Novel Coronavirus (COVID-19) Infection Who Receive Care at Home  Individuals who are confirmed to have, or are being evaluated for, COVID-19 should follow the prevention steps below until a healthcare provider or local or state health department says they can return to normal activities.  Stay home except to get medical care You should restrict activities outside your home, except for getting medical care. Do not go to work, school, or public areas, and do not use public transportation or taxis.  Call ahead before visiting your doctor Before your medical appointment, call the healthcare provider and tell them that you have, or are being evaluated for, COVID-19 infection. This will help the healthcare providers office take steps to keep other people from getting infected. Ask your healthcare provider to call the local or state health department.  Monitor your symptoms Seek prompt medical attention if your illness is worsening (e.g., difficulty breathing). Before going to your medical appointment, call the healthcare provider and tell them that you have, or are being evaluated for, COVID-19 infection. Ask your healthcare provider to call the local or state health department.  Wear a facemask You should wear a facemask that covers your nose and mouth when you are in the same room with other people and when you visit a healthcare provider. People who live with or visit you should also wear a facemask while they are in the same room with you.  Separate  yourself from other people in your home As much as possible, you should stay in a different room from other people in your home. Also, you should use a separate bathroom, if available.  Avoid sharing household items You should not share dishes, drinking glasses, cups, eating utensils, towels, bedding, or other items with other people in your home. After using these items, you should wash them thoroughly with soap and water.  Cover your coughs and sneezes Cover your mouth and nose with a tissue when you cough or sneeze, or you can cough or sneeze into your sleeve. Throw used tissues in a lined trash can, and immediately wash your hands with soap and water for at least 20 seconds or use an alcohol-based hand rub.  Wash your [facility] your hands often and thoroughly with soap and water for at least 20 seconds. You can use an alcohol-based hand sanitizer if soap and water are not available and if your hands are not visibly dirty. Avoid touching your eyes, nose, and mouth with unwashed hands.   Prevention Steps for Caregivers and Household Members of Individuals Confirmed to have, or Being Evaluated for, COVID-19 Infection Being Cared for in the Home  If you live with, or provide care at home for, a person confirmed to have, or being evaluated for, COVID-19 infection please follow these guidelines to prevent infection:  Follow healthcare providers instructions Make sure that you understand and can help the patient follow any healthcare provider instructions for all care.  Provide for the patients basic needs You should help the patient with basic needs in the home and provide support for  getting groceries, prescriptions, and other personal needs.  Monitor the patients symptoms If they are getting sicker, call his or her medical provider and tell them that the patient has, or is being evaluated for, COVID-19 infection. This will help the healthcare providers office take steps to keep  other people from getting infected. Ask the healthcare provider to call the local or state health department.  Limit the number of people who have contact with the patient If possible, have only one caregiver for the patient. Other household members should stay in another home or place of residence. If this is not possible, they should stay in another room, or be separated from the patient as much as possible. Use a separate bathroom, if available. Restrict visitors who do not have an essential need to be in the home.  Keep older adults, very young children, and other sick people away from the patient Keep older adults, very young children, and those who have compromised immune systems or chronic health conditions away from the patient. This includes people with chronic heart, lung, or kidney conditions, diabetes, and cancer.  Ensure good ventilation Make sure that shared spaces in the home have good air flow, such as from an air conditioner or an opened window, weather permitting.  Wash your hands often Wash your hands often and thoroughly with soap and water for at least 20 seconds. You can use an alcohol based hand sanitizer if soap and water are not available and if your hands are not visibly dirty. Avoid touching your eyes, nose, and mouth with unwashed hands. Use disposable paper towels to dry your hands. If not available, use dedicated cloth towels and replace them when they become wet.  Wear a facemask and gloves Wear a disposable facemask at all times in the room and gloves when you touch or have contact with the patients blood, body fluids, and/or secretions or excretions, such as sweat, saliva, sputum, nasal mucus, vomit, urine, or feces.  Ensure the mask fits over your nose and mouth tightly, and do not touch it during use. Throw out disposable facemasks and gloves after using them. Do not reuse. Wash your hands immediately after removing your facemask and gloves. If your  personal clothing becomes contaminated, carefully remove clothing and launder. Wash your hands after handling contaminated clothing. Place all used disposable facemasks, gloves, and other waste in a lined container before disposing them with other household waste. Remove gloves and wash your hands immediately after handling these items.  Do not share dishes, glasses, or other household items with the patient Avoid sharing household items. You should not share dishes, drinking glasses, cups, eating utensils, towels, bedding, or other items with a patient who is confirmed to have, or being evaluated for, COVID-19 infection. After the person uses these items, you should wash them thoroughly with soap and water.  Wash laundry thoroughly Immediately remove and wash clothes or bedding that have blood, body fluids, and/or secretions or excretions, such as sweat, saliva, sputum, nasal mucus, vomit, urine, or feces, on them. Wear gloves when handling laundry from the patient. Read and follow directions on labels of laundry or clothing items and detergent. In general, wash and dry with the warmest temperatures recommended on the label.  Clean all areas the individual has used often Clean all touchable surfaces, such as counters, tabletops, doorknobs, bathroom fixtures, toilets, phones, keyboards, tablets, and bedside tables, every day. Also, clean any surfaces that may have blood, body fluids, and/or secretions or excretions on them.  Wear gloves when cleaning surfaces the patient has come in contact with. Use a diluted bleach solution (e.g., dilute bleach with 1 part bleach and 10 parts water) or a household disinfectant with a label that says EPA-registered for coronaviruses. To make a bleach solution at home, add 1 tablespoon of bleach to 1 quart (4 cups) of water. For a larger supply, add  cup of bleach to 1 gallon (16 cups) of water. Read labels of cleaning products and follow recommendations provided on  product labels. Labels contain instructions for safe and effective use of the cleaning product including precautions you should take when applying the product, such as wearing gloves or eye protection and making sure you have good ventilation during use of the product. Remove gloves and wash hands immediately after cleaning.  Monitor yourself for signs and symptoms of illness Caregivers and household members are considered close contacts, should monitor their health, and will be asked to limit movement outside of the home to the extent possible. Follow the monitoring steps for close contacts listed on the symptom monitoring form.   ? If you have additional questions, contact your local health department or call the epidemiologist on call at [phone] (available 24/7). ? This guidance is subject to change. For the most up-to-date guidance from [facility], please refer to their website: [email or web address]

## 2020-09-17 NOTE — ED Provider Notes (Signed)
[place] EMERGENCY DEPARTMENT Provider Note   CSN: [id number] Arrival date & time: 09/17/20  1834     History Chief Complaint  Patient presents with  . Nasal Congestion    cough    Lacey Lee is a 44 y.o. female history of neurofibromatosis, pneumonia, here presenting with sinus congestion.  Patient has been having congestion for 3 days.  Patient has some dry cough as well.  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  Patient has no known Covid contact.  Patient is fully vaccinated for Covid.   The history is provided by the patient.       Past Medical History:  Diagnosis Date  . Anxiety    slight  . Asthma    albuterol rescue inhaler-uses prn, exacerbated by pet dander  . History of ectopic pregnancy 1999   S/P RIGHT SALPINGECTOMY  . History of herpes simplex type 2 infection 2005  . History of uterine fibroid   . Neurofibromatosis [facility])    dx is child--  nonmalignant  . Pneumonia    years ago  . Seasonal allergies     Patient Active Problem List   Diagnosis Date Noted  . Allergy to alpha-gal 04/21/2020  . Allergic conjunctivitis of both eyes 04/21/2020  . Lumbar radiculopathy, right 06/13/2019  . Patellofemoral arthritis of right knee 02/27/2019  . IDA (iron deficiency anemia) 10/28/2018  . Cervical stenosis of spinal canal 02/07/2018  . Neurofibromatosis ([place]) 02/04/2017  . Greater trochanteric bursitis of left hip 08/31/2016  . Endometriosis 01/06/2013  . Other allergic rhinitis 05/29/2012  . Mild intermittent reactive airway disease 05/29/2012    Past Surgical History:  Procedure Laterality Date  . CYSTOSCOPY N/A 05/11/2017   Procedure: CYSTOSCOPY;  Surgeon: Megan Salon, MD;  Location: [facility];  Service: Gynecology;  Laterality: N/A;  . DILATATION & CURETTAGE/HYSTEROSCOPY WITH MYOSURE N/A 08/07/2016   Procedure: HYSTEROSCOPY WITH MYOSURE;  Surgeon: Megan Salon, MD;  Location: [facility];   Service: Gynecology;  Laterality: N/A;  Fibroid resection  . IUD REMOVAL N/A 08/07/2016   Procedure: INTRAUTERINE DEVICE (IUD) REMOVAL;  Surgeon: Megan Salon, MD;  Location[facility];  Service: Gynecology;  Laterality: N/A;  . IUD REMOVAL N/A 05/11/2017   Procedure: INTRAUTERINE DEVICE (IUD) REMOVAL;  Surgeon: Megan Salon, MD;  Location: [facility];  Service: Gynecology;  Laterality: N/A;  . LAPAROSCOPIC LYSIS OF ADHESIONS  05/11/2017   Procedure: LAPAROSCOPIC LYSIS OF ADHESIONS;  Surgeon: Megan Salon, MD;  Location: [facility];  Service: Gynecology;;  with cautery of endometroisis  . LAPAROSCOPY FOR ECTOPIC PREGNANCY  2000   RIGHT SALPINGECTOMY  . ROBOT ASSISTED MYOMECTOMY N/A 12/16/2012   Procedure: ROBOTIC ASSISTED MYOMECTOMY;  Surgeon: Governor Specking, MD;  Location: [facility];  Service: Gynecology;  Laterality: N/A;  . ROBOTIC ASSISTED LAPAROSCOPIC LYSIS OF ADHESION  12/16/2012   Procedure: ROBOTIC ASSISTED LAPAROSCOPIC LYSIS OF ADHESION;  Surgeon: Governor Specking, MD;  Location: [facility];  Service: Gynecology;;  with excision of perotoneal lesions  . TOTAL LAPAROSCOPIC HYSTERECTOMY WITH SALPINGECTOMY Bilateral 05/11/2017   Procedure: HYSTERECTOMY TOTAL LAPAROSCOPIC WITH LEFT SALPINGECTOMY;  Surgeon: Megan Salon, MD;  Location: [facility];  Service: Gynecology;  Laterality: Bilateral;  . WISDOM TOOTH EXTRACTION       OB History    Gravida  1   Para  0   Term  0   Preterm  0   AB  1   Living  0     SAB  0   IAB  0   Ectopic  1   Multiple  0   Live Births  0           Family History  Adopted: Yes  Problem Relation Age of Onset  . Allergies Mother        food  . Hypertension Mother   . Hyperlipidemia Mother   . Heart failure Mother   . Thyroid disease Mother   . Diabetes Mother   . Lung cancer Father   . Diabetes Sister   . Hypertension Sister   . Renal Disease Sister        dialysis  . Uterine cancer Paternal Grandmother   . Breast cancer Neg  Hx     Social History   Tobacco Use  . Smoking status: Never Smoker  . Smokeless tobacco: Never Used  Vaping Use  . Vaping Use: Never used  Substance Use Topics  . Alcohol use: Not Currently  . Drug use: No    Home Medications Prior to Admission medications   Medication Sig Start Date End Date Taking? Authorizing Provider  albuterol (PROVENTIL HFA;VENTOLIN HFA) 108 (90 BASE) MCG/ACT inhaler Inhale 2 puffs into the lungs every 6 (six) hours as needed for wheezing or shortness of breath.     [provider]  ALPRAZolam Duanne Moron) 0.25 MG tablet Take 0.25 mg by mouth daily as needed for anxiety.  04/30/16   [provider]  Azelastine-Fluticasone 137-50 MCG/ACT SUSP Place 1 spray into the nose in the morning and at bedtime. 04/19/20   Garnet Sierras, DO  cetirizine (ZYRTEC) 10 MG tablet Take 10 mg by mouth daily.    [provider]  EPINEPHrine 0.3 mg/0.3 mL IJ SOAJ injection Inject 0.3 mLs (0.3 mg total) into the muscle as needed for anaphylaxis. Patient not taking: Reported on 06/25/2020 04/19/20   Garnet Sierras, DO  Ferrous Fumarate (HEMOCYTE - 106 MG FE) 324 (106 Fe) MG TABS tablet Take 1 tablet by mouth daily.    [provider]  ibuprofen (ADVIL,MOTRIN) 800 MG tablet Take 1 tablet (800 mg total) by mouth every 8 (eight) hours as needed. Patient taking differently: Take 800 mg by mouth every 8 (eight) hours as needed for moderate pain.  01/09/17   Megan Salon, MD  montelukast (SINGULAIR) 10 MG tablet Take 1 tablet (10 mg total) by mouth at bedtime. 04/19/20   Garnet Sierras, DO  Olopatadine HCl 0.2 % SOLN Apply 1 drop to eye daily as needed (itchy/watery eyes). 04/19/20   Garnet Sierras, DO  Vitamin D, Ergocalciferol, (DRISDOL) 1.25 MG (50000 UT) CAPS capsule TAKE ONE CAPSULE BY MOUTH EVERY 7 DAYS 10/03/19   Megan Salon, MD    Allergies    Latex, Other, and Tramadol  Review of Systems   Review of Systems  HENT: Positive for congestion.   All other systems  reviewed and are negative.   Physical Exam Updated Vital Signs BP 133/87   Pulse 84   Temp 98.2 F (36.8 C) (Oral)   Resp 18   Ht 5\' 1"  (1.549 m)   Wt 98.9 kg   LMP 05/07/2017   SpO2 100%   BMI 41.19 kg/m   Physical Exam Vitals and nursing note reviewed.  Constitutional:      Appearance: Normal appearance.  HENT:     Head: Normocephalic.     Mouth/Throat:     Mouth: Mucous membranes are  moist.  Eyes:     Extraocular Movements: Extraocular movements intact.  Cardiovascular:     Rate and Rhythm: Normal rate and regular rhythm.     Pulses: Normal pulses.     Heart sounds: Normal heart sounds.  Pulmonary:     Effort: Pulmonary effort is normal.     Breath sounds: Normal breath sounds.  Abdominal:     General: Abdomen is flat.     Palpations: Abdomen is soft.  Musculoskeletal:        General: Normal range of motion.     Cervical back: Normal range of motion.  Skin:    General: Skin is warm.     Capillary Refill: Capillary refill takes less than 2 seconds.  Neurological:     General: No focal deficit present.     Mental Status: She is alert.  Psychiatric:        Mood and Affect: Mood normal.     ED Results / Procedures / Treatments   Labs (all labs ordered are listed, but only abnormal results are displayed) Labs Reviewed  SARS CORONAVIRUS 2 ([facility]) - Abnormal; Notable for the following components:      Result Value   SARS Coronavirus 2 POSITIVE (*)    All other components within normal limits  RESP PANEL BY RT-PCR (FLU A&B, COVID) [id number]    EKG None  Radiology No results found.  Procedures Procedures (including critical care time)  Medications Ordered in ED Medications - No data to display  ED Course  I have reviewed the triage vital signs and the nursing notes.  Pertinent labs & imaging results that were available during my care of the patient were reviewed by me and considered in my medical  decision making (see chart for details).    MDM Rules/Calculators/A&P                         Tariya Leronda Lewers is a 44 y.o. female here presenting with congestion.  Patient's oxygen level is 100% and she is afebrile well-appearing.  She has no known contacts with Covid and is vaccinated against COVID.  Unfortunately she is positive for Covid.  I told her to quarantine for 10 days.  Gave strict return precautions.  Ozell Ferrera was evaluated in Emergency Department on 09/17/2020 for the symptoms described in the history of present illness. She was evaluated in the context of the global COVID-19 pandemic, which necessitated consideration that the patient might be at risk for infection with the SARS-CoV-2 virus that causes COVID-19. Institutional protocols and algorithms that pertain to the evaluation of patients at risk for COVID-19 are in a state of rapid change based on information released by regulatory bodies including the CDC and federal and state organizations. These policies and algorithms were followed during the patient's care in the ED.   Final Clinical Impression(s) / ED Diagnoses Final diagnoses:  None    Rx / DC Orders ED Discharge Orders    None       Drenda Freeze, MD 09/17/20 2013

## 2020-10-09 ENCOUNTER — Encounter: Payer: Self-pay | Admitting: Family Medicine

## 2020-10-09 ENCOUNTER — Ambulatory Visit: Payer: BC Managed Care – PPO | Admitting: Family Medicine

## 2020-10-09 VITALS — BP 136/84 | HR 84 | Ht 61.0 in | Wt 217.0 lb

## 2020-10-09 DIAGNOSIS — M5416 Radiculopathy, lumbar region: Secondary | ICD-10-CM

## 2020-10-09 DIAGNOSIS — Q8500 Neurofibromatosis, unspecified: Secondary | ICD-10-CM

## 2020-10-09 DIAGNOSIS — R208 Other disturbances of skin sensation: Secondary | ICD-10-CM

## 2020-10-09 NOTE — Patient Instructions (Signed)
Below is our plan:  We will update imaging for evaluation of any potential progression. Please monitor symptoms closely and let me know if you have any worsening symptoms. Consider gabapentin if pain is disruptive. Try to stay active.   Please make sure you are staying well hydrated. I recommend 50-60 ounces daily. Well balanced diet and regular exercise encouraged.    Please continue follow up with care team as directed.   Follow up pending MRI review   You may receive a survey regarding today's visit. I encourage you to leave honest feed back as I do use this information to improve patient care. Thank you for seeing me today!      Neurofibromatosis, Adult Neurofibromatosis is a rare genetic disorder that causes skin and bone changes and the development of nerve tumors. The tumors vary depending on the type of neurofibromatosis you have, but they are usually not cancerous (are usually benign). There are three types of neurofibromatosis:  Neurofibromatosis 1 (NF1). This is the most common type.  Neurofibromatosis 2 (NF2).  Schwannomatosis. This is the least common type. In most cases, this condition gets worse over time. What are the causes? This condition is caused by a change, or mutation, in genes that control the growth of nerve cells. You may have neurofibromatosis:  At birth. This means you inherited mutated genes from your parents. Up to half of all cases of NF1 and NF2 are inherited.  After birth. This means that your genes mutated after you were born. It is not known why this happens. This is the most common cause of schwannomatosis. What are the signs or symptoms? Symptoms of this condition depend on the type of neurofibromatosis you have.  Signs and symptoms of NF1 usually begin in childhood. They may include:  Coffee-colored skin spots (cafe au lait spots).  Freckles in the groin or under the arms.  Benign tumors (neurofibromas) attached to one or more  nerves.  Discolored specks in the colored part of the eye (iris).  Curving of the spine or other bone deformities.  Short stature.  Headaches.  Seizures.  Learning disabilities. Signs and symptoms of NF2 usually begin in early adulthood. They may include:  Benign tumors that develop on a nerve inside the ear (schwannoma).  Ringing in the ears.  Sudden hearing loss or hearing loss that gets worse over time.  Balance problems or dizziness.  Headache.  Ear pressure or pain.  Facial pain on the affected side.  Vision changes.  Numbness or weakness of the face on the affected side. Signs and symptoms of schwannomatosis usually do not show up until adulthood. They may include:  Long-term pain. This is the most common symptom.  Development of many tumors throughout the body except in the ear.  Numbness.  Tingling. How is this diagnosed? This condition may be diagnosed based on your symptoms and a physical exam. Your health care provider may also do tests to help make the diagnosis. These may include:  Blood tests.  Genetic testing to look for gene mutations.  Imaging studies such as an MRI or CT scan.  Hearing, vision, and balance tests.  Tests for learning disabilities. It may take many years to be diagnosed with the disease because signs and symptoms may develop slowly. How is this treated? There is no cure for this condition, but treatment can relieve symptoms. Treatment may include:  Medicines to control pain or seizures.  Physical therapy for disabilities.  Screening and close monitoring for complications such  as neurofibromas in the eye and brain.  Surgery to remove tumors that cause symptoms or become disfiguring. There is a small chance that tumors in people with NF1 will become cancerous. Tumors that become cancerous may need to be treated with: ? Surgery. ? Radiation. ? Medicines that kill cancer cells (chemotherapy). ? Combination of surgery,  radiation, or chemotherapy.   Follow these instructions at home: Medicines  Take over-the-counter and prescription medicines only as told by your health care provider.  Do not drive or use heavy machinery while taking prescription pain medicine.  If you are taking prescription pain medicine, take actions to prevent or treat constipation. Your health care provider may recommend that you: ? Drink enough fluid to keep your urine pale yellow. ? Eat foods that are high in fiber, such as fresh fruits and vegetables, whole grains, and beans. ? Limit foods that are high in fat and processed sugars, such as fried or sweet foods. ? Take an over-the-counter or prescription medicine for constipation. General instructions  Avoid activities that may put you at risk for injury. This is especially important if you have dizziness or seizures. Ask your health care provider what activities are safe for you.  Pay attention to any changes in your symptoms. Tell your health care provider about any changes or new symptoms.  Keep all follow-up visits as told by your health care provider. This is important. This may include visits for tests, therapy, or counseling. Contact a health care provider if:  You develop any new symptoms.  Your symptoms get worse. Get help right away if:  You have a seizure.  You have a severe headache.  You have a sudden change in vision, balance, or hearing. Summary  Neurofibromatosis is a rare genetic disorder that causes skin and bone changes and the development of nerve tumors.  The tumors vary depending on the type of neurofibromatosis you have, but they are usually not cancerous (are usually benign).  Your health care provider can diagnose the condition based on your symptoms and a physical exam.  There is no cure for this condition. Treatment for your symptoms will be done by a team of health care specialists. It may include medicines, therapy, and surgery. This  information is not intended to replace advice given to you by your health care provider. Make sure you discuss any questions you have with your health care provider. Document Revised: 10/27/2017 Document Reviewed: 10/07/2017 Elsevier Patient Education  2021 [employer].

## 2020-10-09 NOTE — Progress Notes (Addendum)
Chief Complaint  Patient presents with  . Follow-up    Rm 1, alone, reports left leg numbness     HISTORY OF PRESENT ILLNESS: Today 10/09/20  Lacey Lee is a 45 y.o. female here today for follow up for neurofibromatosis. She was last seen 02/13/2018 and reportedly doing very well.  Last MRI of brain, cervical spine, thoracic spine and lumbar spine performed in 2018.  She reports that symptoms are fairly stable.  She has had no significant changes.  She has had intermittent pain, numbness and tingling of both lower extremities over the past 4-5 years.  Symptoms usually affect the right side more than the left.  Over the past year she has had more difficulty with left-sided symptoms.  She occasionally has sharp stabbing pains that radiate from the left hip, through the left groin and down the left leg.  She has numbness and tingling sensations of the entire leg.  She has not noted much difficulty with right leg symptoms.  No changes in gait.  No falls. No changes in bladder functioning. No other dysesthesias noted.  No vision changes.  Eye exam was normal over the summer.  She has had a couple of new skin lesions.  She does have occasional headaches.  She was recently diagnosed with COVID but reports mild symptoms.  She has had 2 headaches since COVID diagnosis.  These are usually easily aborted with over-the-counter analgesics.  She does not like to take a lot of medications.  She has taken gabapentin in the past but unsure if it was beneficial.  She would like to update imaging today to monitor for any progression of disease.  She does not wish to start any new medications.  She is working full-time and pathology.  She denies any difficulty sleeping.  Mood is good. She is not followed by specialty clinic.    HISTORY (copied from previous note) 02/07/2018 Interval history: Doing well, no new symptoms, no vision changes, no new lesions, no headaches or seizures. Nothing is bothering her.  She saw ophthalmology and going to follow up. Reviewed MRI images, discussed cervical stenosis, risks, she declines repeat MRI will image next year unless symptomatic.   Addendum 03/17/17: NF type 1. Good ou, full VF ou, essentially normal complete exam, f/u one year  HPI:  Lacey Lee is a 45 y.o. female here as a referral from Dr. Alroy Dust for neurofibromatosis. Past medical history of depression, generalized anxiety disorder, iron deficiency anemia and vitamin D deficiency. Patient's father with neurofibromatosis. Since last being seen 6 years ago by Dr. Erling Cruz who is a retired Garment/textile technologist. Saw a Paediatric nurse in 2013. Last imaging in 2012 and no follow up since then. She has some new skin lesions. She has noticed vision changes. She has chronic back pain more in the lower back and radiating into the hip. No GI issues. She gets regular obgyn screening. Discussed specialty clinics. No abdominal pain. No sensory paresthesias. No cognitive deficits. Never been to a specialty clinic. No weakness, no seizures, she has had some dizziness and headaches when she stands up worse better if she sits down. The headache is pressure in the face has been worsening. No cognitive problems, no falls, no weakness, no other pain. She has chronic constipation and anemia.   Reviewed notes, labs and imaging from outside physicians, which showed:  Reviewed MRI of the brain from 1993 report: FINDINGS:  [place]  REGION.  THIS DEMONSTRATES MARKED CONTRAST ENHANCEMENT.  IN A PATIENT WITH NEUROFIBROMATOSIS 1, THIS COULD REPRESENT A SMALL PILOCYTIC ASTROCYTOMA AND IT HAS NOT CHANGED IN SIZE.  IN ADDITION, THERE IS NO SIGNIFICANT CHANGE IN A SMALL LESION IN THE SPLENIUM OF THE CORPUS CALLOSUM THAT DEMONSTRATES MINIMAL CONTRAST ENHANCEMENT.  THIS IS LIKELY BENIGN AND MAY REPRESENT A HAMARTOMA OR AN AREA OF GLIOSIS.  THERE ARE NO NEW ENHANCING LESIONS.   NO SIGNIFICANT MASS EFFECT AND NO MIDLINE SHIFT.  NEGATIVE FOR EXTRA-AXIAL FLUID COLLECTIONS. THE VENTRICULAR SYSTEM AND SULCI ARE OF NORMAL SIZE. CONCLUSIONS: 1.  NO SIGNIFICANT CHANGE IN SMALL ENHANCING NODULAR LESION PERIATRIAL REGION ON THE LEFT.  DIFFERENTIAL INCLUDES PILOCYTIC ASTROCYTOMA, HAMARTOMA.  2.  SMALL, MILDLY ENHANCING LESION IN THE SPLENIUM OF THE CORPUS CALLOSUM ALSO UNCHANGED AND LIKELY REPRESENTS BENIGN LESION SUCH AS HAMARTOMA. APPROVING MD:  Virgel Bouquet  MRI spine 1992: Reviewed report    COMPARE 08-03-89.  NO COMPARISON IS AVAILABLE FOR THE THORACIC AND CERVICAL SPINE.   Marland Kitchen   FINDINGS: NO SIGNIFICANT CHANGE IN THE CYSTIC STRUCTURE IN THE SPINAL CANAL AT THE LEVEL OF S-3.  THIS LESION FOLLOWS C.S.F. ON ALL PULSE SEQUENCES, AND DOES NOT ENHANCE.  THIS IS FELT TO BE MOST CONSISTENT WITH SACRAL CYSTS.  NEUROFIBROMA WOULD BE EXPECTED TO ENHANCE, AT LEAST IN THE PERIPHERY.   NEGATIVE FOR EVIDENCE OF OTHER SPINAL ABNORMALITY. NEURAL FORAMINA ARE NORMAL IN SIZE AND NO FORAMINAL MASSES ARE IDENTIFIED.  THERE IS NO EVIDENCE OF CORD OR NERVE ROOT COMPRESSION.  INCIDENTAL NOTE IS MADE OF BILATERL PROMINENT OVARIES WITH MULTIPLE CYSTS, CONSISTENT WITH FOLLICLES.  THIS IS NOT EVIDENT ON PRIOR EXAM, ALTHOUGH IMAGING DID NOT EXTEND FULLY INTO THE PELVIS ON PRIOR STUDY. CONCLUSIONS:   1.  NO SIGNIFICANT CHANGE IN  SACRAL CYST.   2.  BILATERAL PROMINENT OVARIES, WHICH ARE NOW SOMEWHAT CYSTIC IN APPEARANCE, CONSISTENT WITH MULTIPLE FOLLICLES.   3.  NEGATIVE FOR OTHER SPINAL LESION. APPROVING MD:  Iva Lento  MRI pelvis 07/1989  FINDINGS:  SEVERAL WELL-CIRCUMSCRIBED AREA OF HYPOINTENSITY ON T1 WEIGHTED IMAGES AND INCREASED SIGNAL INTENSITY ON T2 WEIGHTED IMAGES ACCOMPANY THE NEUROVASCULAR BUNDLE INFERIORLY WITHIN THE TRUE PELVIS. THEY ARE SYMMETRIC IN CHARACTER.  WHILE THESE MOST LIKELY REPRESENT LYMPH NODES, SMALL SCHWANNOMAS OR NEUROFIBROMAS CANNOT BE  EXCLUDED.  NO LARGE PLEXIFORM LESIONS ARE SEEN WITHIN THE PELVIS.  AT APPROX. S-3 LEVEL, WITHIN THE CANAL, THERE IS AN ENLARGED, WELL-CIRCUMSCRIBED AREA, HYPOINTENSE ON T1 WEIGHTED IMAGES, AND HYPERINTENSE ON T2 WEIGHTED IMAGES FOLLOWING THE SIGNAL OF THE C.S.F.   HYPERINTENSITY IS SEEN WITHIN A SLIGHTLY ENLARGED ENDOMETRIAL CAVITY, PRESUMABLY REPRESENTING PROLIFERATIVE OR SLOUGHED ENDOMETRIUM.  BOTH OVARIES ARE SEEN, AS WELL, WITH THE LEFT OVARY DEMONSTRATING A HYPOINTENSE LESION ON ALL SEQUENCES MEDIALLY, LIKELY REPRESENTING A SMALL REGION OF CALCIFICATION OR FIBROSIS/SCARRING. CONCLUSIONS:   1.  PROBABLE SMALL LYMPH NODES ACCOMPANYING NEUROVASCULAR BUNDLE IN THE TRUE PELVIS.  SMALL NEUROFIBROMAS CANNOT BE TOTALLY EXCLUDED.   2.  NO LARGE PLEXIFORM OR OTHER MASSES WITHIN THE PELVIS.   3.  WELL-DEFINED REGION FOLLOWING C.S.F. IN INTENSITY ON SEQUENCES INVOLVING THE S-3 REGION WITHIN THE SACRAL SPINAL CANAL.  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 A CENTRAL MENINGOCELE OR ARACHNOID CYST CANNOT BE EXCLUDED. RECOMMEND CORRELATION WITH L-S SPINE MR, PERFORMED ON 08-03-89.  I reviewed previous neurology notes. She was first seen by Dr. love in 2003 for evaluation of neurofibromatosis. She has a positive family history in her father, paternal grandmother, 3 paternal  uncles and a paternal aunt. Per notes, previous MRIs of the brain cervical spine thoracic and lumbar spine with and without contrast were normal last evaluation was January 2011 with previous imaging April 2003, September 2004, August 2005 and September 2 6. She was describing headaches at that time. No visual disturbances.   REVIEW OF SYSTEMS: Out of a complete 14 system review of symptoms, the patient complains only of the following symptoms, pain, numbness, tingling, headaches, and all other reviewed systems are negative.   ALLERGIES: Allergies  Allergen Reactions  . Latex  Other (See Comments)    DERMITITIS  . Other Hives and Swelling    PORK (alphagal)-lip/tongue swelling, hives  . Tramadol Nausea And Vomiting     HOME MEDICATIONS: Outpatient Medications Prior to Visit  Medication Sig Dispense Refill  . albuterol (PROVENTIL HFA;VENTOLIN HFA) 108 (90 BASE) MCG/ACT inhaler Inhale 2 puffs into the lungs every 6 (six) hours as needed for wheezing or shortness of breath.     . ALPRAZolam (XANAX) 0.25 MG tablet Take 0.25 mg by mouth daily as needed for anxiety.     . Azelastine-Fluticasone 137-50 MCG/ACT SUSP Place 1 spray into the nose in the morning and at bedtime. 23 g 5  . cetirizine (ZYRTEC) 10 MG tablet Take 10 mg by mouth daily.    Marland Kitchen EPINEPHrine 0.3 mg/0.3 mL IJ SOAJ injection Inject 0.3 mLs (0.3 mg total) into the muscle as needed for anaphylaxis. 1 each 2  . Ferrous Fumarate (HEMOCYTE - 106 MG FE) 324 (106 Fe) MG TABS tablet Take 1 tablet by mouth daily.    Marland Kitchen ibuprofen (ADVIL,MOTRIN) 800 MG tablet Take 1 tablet (800 mg total) by mouth every 8 (eight) hours as needed. (Patient taking differently: Take 800 mg by mouth every 8 (eight) hours as needed for moderate pain.) 30 tablet 1  . montelukast (SINGULAIR) 10 MG tablet Take 1 tablet (10 mg total) by mouth at bedtime. 30 tablet 5  . Olopatadine HCl 0.2 % SOLN Apply 1 drop to eye daily as needed (itchy/watery eyes). 2.5 mL 5  . Vitamin D, Ergocalciferol, (DRISDOL) 1.25 MG (50000 UT) CAPS capsule TAKE ONE CAPSULE BY MOUTH EVERY 7 DAYS 12 capsule 3   No facility-administered medications prior to visit.     PAST MEDICAL HISTORY: Past Medical History:  Diagnosis Date  . Anxiety    slight  . Asthma    albuterol rescue inhaler-uses prn, exacerbated by pet dander  . History of ectopic pregnancy 1999   S/P RIGHT SALPINGECTOMY  . History of herpes simplex type 2 infection 2005  . History of uterine fibroid   . Neurofibromatosis [facility])    dx is child--  nonmalignant  . Pneumonia    years ago  . Seasonal  allergies      PAST SURGICAL HISTORY: Past Surgical History:  Procedure Laterality Date  . CYSTOSCOPY N/A 05/11/2017   Procedure: CYSTOSCOPY;  Surgeon: Megan Salon, MD;  Location: [facility];  Service: Gynecology;  Laterality: N/A;  . DILATATION & CURETTAGE/HYSTEROSCOPY WITH MYOSURE N/A 08/07/2016   Procedure: HYSTEROSCOPY WITH MYOSURE;  Surgeon: Megan Salon, MD;  [facility];  Service: Gynecology;  Laterality: N/A;  Fibroid resection  . IUD REMOVAL N/A 08/07/2016   Procedure: INTRAUTERINE DEVICE (IUD) REMOVAL;  Surgeon: Megan Salon, MD;  Location[facility];  Service: Gynecology;  Laterality: N/A;  . IUD REMOVAL N/A 05/11/2017   Procedure: INTRAUTERINE DEVICE (IUD) REMOVAL;  Surgeon: Megan Salon, MD;  Location: [facility];  Service: Gynecology;  Laterality: N/A;  . LAPAROSCOPIC LYSIS OF ADHESIONS  05/11/2017   Procedure: LAPAROSCOPIC LYSIS OF ADHESIONS;  Surgeon: Megan Salon, MD;  Location: [facility];  Service: Gynecology;;  with cautery of endometroisis  . LAPAROSCOPY FOR ECTOPIC PREGNANCY  2000   RIGHT SALPINGECTOMY  . ROBOT ASSISTED MYOMECTOMY N/A 12/16/2012   Procedure: ROBOTIC ASSISTED MYOMECTOMY;  Surgeon: Governor Specking, MD;  Location: [facility];  Service: Gynecology;  Laterality: N/A;  . ROBOTIC ASSISTED LAPAROSCOPIC LYSIS OF ADHESION  12/16/2012   Procedure: ROBOTIC ASSISTED LAPAROSCOPIC LYSIS OF ADHESION;  Surgeon: Governor Specking, MD;  Location: [facility];  Service: Gynecology;;  with excision of perotoneal lesions  . TOTAL LAPAROSCOPIC HYSTERECTOMY WITH SALPINGECTOMY Bilateral 05/11/2017   Procedure: HYSTERECTOMY TOTAL LAPAROSCOPIC WITH LEFT SALPINGECTOMY;  Surgeon: Megan Salon, MD;  Location: [facility];  Service: Gynecology;  Laterality: Bilateral;  . WISDOM TOOTH EXTRACTION       FAMILY HISTORY: Family History  Adopted: Yes  Problem Relation Age of Onset  . Allergies Mother        food  . Hypertension Mother   . Hyperlipidemia  Mother   . Heart failure Mother   . Thyroid disease Mother   . Diabetes Mother   . Lung cancer Father   . Diabetes Sister   . Hypertension Sister   . Renal Disease Sister        dialysis  . Uterine cancer Paternal Grandmother   . Breast cancer Neg Hx      SOCIAL HISTORY: Social History   Socioeconomic History  . Marital status: Single    Spouse name: Not on file  . Number of children: 0  . Years of education: Not on file  . Highest education level: Not on file  Occupational History  . Occupation: Engineer, mining at [employer]: [place]  Tobacco Use  . Smoking status: Never Smoker  . Smokeless tobacco: Never Used  Vaping Use  . Vaping Use: Never used  Substance and Sexual Activity  . Alcohol use: Not Currently  . Drug use: No  . Sexual activity: Yes    Partners: Male    Birth control/protection: Surgical    Comment: [place] 05/11/17   Other Topics Concern  . Not on file  Social History Narrative   Lives at home with her sister   Right handed   Caffeine: 6 cups daily   Social Determinants of Health   Financial Resource Strain: Not on file  Food Insecurity: Not on file  Transportation Needs: Not on file  Physical Activity: Not on file  Stress: Not on file  Social Connections: Not on file  Intimate Partner Violence: Not on file      PHYSICAL EXAM  Vitals:   10/09/20 1423  BP: 136/84  Pulse: 84  Weight: 217 lb (98.4 kg)  Height: 5\' 1"  (1.549 m)   Body mass index is 41 kg/m.   Generalized: Well developed, in no acute distress  Cardiology: normal rate and rhythm, no murmur auscultated  Respiratory: clear to auscultation bilaterally    Neurological examination  Mentation: Alert oriented to time, place, history taking. Follows all commands speech and language fluent Cranial nerve II-XII: Pupils were equal round reactive to light. Extraocular movements were full, visual field were full on confrontational test. Facial sensation and  strength were normal. Uvula tongue midline. Head turning and shoulder shrug  were normal and symmetric. Motor: The motor testing reveals 5 over  5 strength of all 4 extremities. Good symmetric motor tone is noted throughout.  Sensory: Sensory testing is intact to soft touch on all 4 extremities. Pinprick testing is normal. No evidence of extinction is noted.  Coordination: Cerebellar testing reveals good finger-nose-finger and heel-to-shin bilaterally.  Gait and station: Gait is normal.   Reflexes: Deep tendon reflexes are symmetric and normal bilaterally.     DIAGNOSTIC DATA (LABS, IMAGING, TESTING) - I reviewed patient records, labs, notes, testing and imaging myself where available.  Lab Results  Component Value Date   WBC 4.9 06/25/2020   HGB 12.8 06/25/2020   HCT 40.0 06/25/2020   MCV 95 06/25/2020   PLT 198 06/25/2020      Component Value Date/Time   NA 138 04/04/2019 1509   NA 137 06/24/2018 1445   K 3.9 04/04/2019 1509   CL 104 04/04/2019 1509   CO2 26 04/04/2019 1509   GLUCOSE 85 04/04/2019 1509   BUN 11 04/04/2019 1509   BUN 10 06/24/2018 1445   CREATININE 0.58 04/04/2019 1509   CREATININE 0.63 11/24/2016 0956   CALCIUM 8.5 (L) 04/04/2019 1509   PROT 7.0 04/04/2019 1509   PROT 6.4 06/24/2018 1445   ALBUMIN 4.2 04/04/2019 1509   ALBUMIN 4.0 06/24/2018 1445   AST 15 04/04/2019 1509   ALT 12 04/04/2019 1509   ALKPHOS 79 04/04/2019 1509   BILITOT 0.6 04/04/2019 1509   GFRNONAA >60 04/04/2019 1509   GFRAA >60 04/04/2019 1509   Lab Results  Component Value Date   CHOL 139 06/24/2018   HDL 82 06/24/2018   LDLCALC 49 06/24/2018   TRIG 39 06/24/2018   CHOLHDL 1.7 06/24/2018   Lab Results  Component Value Date   HGBA1C 4.9 02/05/2020   No results found for: [id number] Lab Results  Component Value Date   TSH 1.080 06/24/2018    No flowsheet data found.   ASSESSMENT AND PLAN  45 y.o. year old female  has a past medical history of Anxiety, Asthma,  History of ectopic pregnancy (1999), History of herpes simplex type 2 infection (2005), History of uterine fibroid, Neurofibromatosis ([place]), Pneumonia, and Seasonal allergies. here with   Neurofibromatosis [facility]) - Plan: MR BRAIN W WO CONTRAST, MR CERVICAL SPINE W WO CONTRAST, MR Lumbar Spine W Wo Contrast, Ambulatory referral to Neurology  Dysesthesia affecting both sides of body - Plan: MR BRAIN W WO CONTRAST, MR CERVICAL SPINE W WO CONTRAST, MR Lumbar Spine W Wo Contrast  Lumbar radiculopathy - Plan: MR BRAIN W WO CONTRAST, MR CERVICAL SPINE W WO CONTRAST, MR Lumbar Spine W Wo Contrast  Jaliza presents today for follow-up.  Symptoms have been fairly stable since last being seen with the exception of worsening left-sided symptoms.  She has waxing and waning pain with numbness and tingling.  Neuro exam is intact today.  No sensory deficit reported.  Last imaging was in 2018.  We will update imaging to monitor for any progression in disease process.  Fortunately, she does not have any red flag warnings.  We have discussed options for management of pain.  She is not interested in adding any additional medications at this time.  She will continue complementary treatment for pain.  She was encouraged to remain active.  She will call me for any worsening symptoms.  I will have her follow-up pending review of imaging. Referral to [facility] placed for specialty consult. She verbalizes understanding and agreement with this plan.   Orders Placed This Encounter  Procedures  .  MR BRAIN W WO CONTRAST    Standing Status:   Future    Standing Expiration Date:   10/09/2021    Order Specific Question:   If indicated for the ordered procedure, I authorize the administration of contrast media per Radiology protocol    Answer:   Yes    Order Specific Question:   What is the patient's sedation requirement?    Answer:   No Sedation    Order Specific Question:   Does the patient have a pacemaker or implanted devices?     Answer:   No    Order Specific Question:   Preferred imaging location?    Answer:   Internal  . MR CERVICAL SPINE W WO CONTRAST    Standing Status:   Future    Standing Expiration Date:   10/09/2021    Order Specific Question:   If indicated for the ordered procedure, I authorize the administration of contrast media per Radiology protocol    Answer:   Yes    Order Specific Question:   What is the patient's sedation requirement?    Answer:   No Sedation    Order Specific Question:   Does the patient have a pacemaker or implanted devices?    Answer:   No    Order Specific Question:   Preferred imaging location?    Answer:   Internal  . MR Lumbar Spine W Wo Contrast    Standing Status:   Future    Standing Expiration Date:   10/09/2021    Order Specific Question:   If indicated for the ordered procedure, I authorize the administration of contrast media per Radiology protocol    Answer:   Yes    Order Specific Question:   What is the patient's sedation requirement?    Answer:   No Sedation    Order Specific Question:   Does the patient have a pacemaker or implanted devices?    Answer:   No    Order Specific Question:   Preferred imaging location?    Answer:   Internal  . Ambulatory referral to Neurology    Referral Priority:   Routine    Referral Type:   Consultation    Referral Reason:   Specialty Services Required    Requested Specialty:   Neurology    Number of Visits Requested:   1      I spent 30 minutes of face-to-face and non-face-to-face time with patient.  This included previsit chart review, lab review, study review, order entry, electronic health record documentation, patient education.    Debbora Presto, MSN, FNP-C 10/09/2020, 4:14 PM  [facility] [address] [place], [place] [address] [phone]  Made any corrections needed, and agree with history, physical, neuro exam,assessment and plan as stated.     Sarina Ill, MD [facility]

## 2020-10-17 ENCOUNTER — Telehealth: Payer: Self-pay | Admitting: Family Medicine

## 2020-10-17 NOTE — Telephone Encounter (Signed)
no to the covid questions MR Brain w/wo contrast, MR Cervical spine w/wo contrast & MR Lumbar spine w/wo contrast Amy Lomax BCBS auth: [place] Ref # [id number]. Patient is scheduled at [facility] for 10/22/20.

## 2020-10-22 ENCOUNTER — Other Ambulatory Visit: Payer: BC Managed Care – PPO

## 2020-10-23 ENCOUNTER — Ambulatory Visit: Payer: BC Managed Care – PPO

## 2020-10-23 DIAGNOSIS — Q8500 Neurofibromatosis, unspecified: Secondary | ICD-10-CM

## 2020-10-23 DIAGNOSIS — M5416 Radiculopathy, lumbar region: Secondary | ICD-10-CM

## 2020-10-23 DIAGNOSIS — R208 Other disturbances of skin sensation: Secondary | ICD-10-CM | POA: Diagnosis not present

## 2020-10-23 MED ORDER — GADOBENATE DIMEGLUMINE 529 MG/ML IV SOLN
20.0000 mL | Freq: Once | INTRAVENOUS | Status: AC | PRN
  Administered 2020-10-23: 20 mL via INTRAVENOUS

## 2020-10-25 ENCOUNTER — Other Ambulatory Visit: Payer: Self-pay | Admitting: Neurology

## 2020-10-29 ENCOUNTER — Telehealth: Payer: Self-pay

## 2020-10-29 NOTE — Telephone Encounter (Signed)
I called patient.  I discussed her MRI brain, cervical, and lumbar spine results and recommendations.  Patient has not been contacted by [facility] for her appointment.  She will follow-up with her PCP to manage pain.  I will check with [name] to see if we can expedite the [facility] referral.  Patient verbalized understanding of results and recommendations.  She had no further questions or concerns.

## 2020-10-29 NOTE — Telephone Encounter (Signed)
I Spoke to [facility].  I had to resend her referral to another fax number her referral is still in review .  New Fax # [phone]   I relayed to patient I will give [facility] until this Coming Thursday for them to Respond.  I cant expedite apt . [facility] Now has a [employer] have to call .  I will keep Patient updated .

## 2020-10-29 NOTE — Telephone Encounter (Signed)
-----   Message from Debbora Presto, NP sent at 10/29/2020  9:27 AM EST ----- Please let her know that her MRI of brain, cervical and lumbar spine are all stable. No changes since last imaging. She does have an area of mild disc degeneration in the cervical spine and lumbar spine but no nerve root compression. This is general "wear and tear" if you will and should not cause significant symptoms. She can follow up with PCP to help manage any pain if worsening. Referral was also placed to academic center for follow up as directed by Dr Jaynee Eagles. She can follow up as needed.

## 2020-10-30 ENCOUNTER — Telehealth: Payer: Self-pay | Admitting: Family Medicine

## 2020-10-30 NOTE — Telephone Encounter (Signed)
Patient picked up MRI CD From 2022

## 2020-10-30 NOTE — Telephone Encounter (Signed)
Let me know if you need anything else as far as your apt to [facility] . Please come 10/31/2020 Thursday after Lunch to pick up your MRI CD disk to take with you to your apt . Thanks so Much [name] .   [name]   A[address] 11/15/2020 arrive at 2:30 for 3:00 .  You can bring one per with you to apt.  Telephone [phone] - Fax [phone] [address] . [place]. [id number]. [address]

## 2020-12-06 DIAGNOSIS — Q8501 Neurofibromatosis, type 1: Secondary | ICD-10-CM | POA: Diagnosis not present

## 2020-12-06 DIAGNOSIS — M5410 Radiculopathy, site unspecified: Secondary | ICD-10-CM | POA: Diagnosis not present

## 2021-01-14 DIAGNOSIS — L989 Disorder of the skin and subcutaneous tissue, unspecified: Secondary | ICD-10-CM | POA: Diagnosis not present

## 2021-01-14 DIAGNOSIS — B353 Tinea pedis: Secondary | ICD-10-CM | POA: Diagnosis not present

## 2021-01-14 DIAGNOSIS — L80 Vitiligo: Secondary | ICD-10-CM | POA: Diagnosis not present

## 2021-01-14 DIAGNOSIS — L813 Cafe au lait spots: Secondary | ICD-10-CM | POA: Diagnosis not present

## 2021-01-14 DIAGNOSIS — D485 Neoplasm of uncertain behavior of skin: Secondary | ICD-10-CM | POA: Diagnosis not present

## 2021-01-14 DIAGNOSIS — D3613 Benign neoplasm of peripheral nerves and autonomic nervous system of lower limb, including hip: Secondary | ICD-10-CM | POA: Diagnosis not present

## 2021-01-24 DIAGNOSIS — Q8501 Neurofibromatosis, type 1: Secondary | ICD-10-CM | POA: Diagnosis not present

## 2021-02-10 ENCOUNTER — Other Ambulatory Visit (HOSPITAL_BASED_OUTPATIENT_CLINIC_OR_DEPARTMENT_OTHER): Payer: Self-pay | Admitting: Obstetrics & Gynecology

## 2021-02-10 DIAGNOSIS — Z1231 Encounter for screening mammogram for malignant neoplasm of breast: Secondary | ICD-10-CM

## 2021-04-01 ENCOUNTER — Inpatient Hospital Stay (HOSPITAL_BASED_OUTPATIENT_CLINIC_OR_DEPARTMENT_OTHER): Admission: RE | Admit: 2021-04-01 | Payer: BC Managed Care – PPO | Source: Ambulatory Visit

## 2021-04-07 ENCOUNTER — Ambulatory Visit (HOSPITAL_BASED_OUTPATIENT_CLINIC_OR_DEPARTMENT_OTHER)
Admission: RE | Admit: 2021-04-07 | Discharge: 2021-04-07 | Disposition: A | Discharge status: Home / Self Care | Payer: BC Managed Care – PPO | Source: Ambulatory Visit | Attending: Obstetrics & Gynecology | Admitting: Obstetrics & Gynecology

## 2021-04-07 ENCOUNTER — Encounter (HOSPITAL_BASED_OUTPATIENT_CLINIC_OR_DEPARTMENT_OTHER): Payer: Self-pay

## 2021-04-07 ENCOUNTER — Other Ambulatory Visit: Payer: Self-pay

## 2021-04-07 DIAGNOSIS — Z1231 Encounter for screening mammogram for malignant neoplasm of breast: Secondary | ICD-10-CM | POA: Insufficient documentation

## 2021-04-15 ENCOUNTER — Encounter (HOSPITAL_BASED_OUTPATIENT_CLINIC_OR_DEPARTMENT_OTHER): Payer: Self-pay | Admitting: Obstetrics & Gynecology

## 2021-07-03 ENCOUNTER — Other Ambulatory Visit: Payer: Self-pay

## 2021-07-03 ENCOUNTER — Ambulatory Visit (INDEPENDENT_AMBULATORY_CARE_PROVIDER_SITE_OTHER): Payer: BC Managed Care – PPO | Admitting: Obstetrics & Gynecology

## 2021-07-03 ENCOUNTER — Encounter (HOSPITAL_BASED_OUTPATIENT_CLINIC_OR_DEPARTMENT_OTHER): Payer: Self-pay | Admitting: Obstetrics & Gynecology

## 2021-07-03 VITALS — BP 130/59 | HR 75 | Ht 61.5 in | Wt 223.6 lb

## 2021-07-03 DIAGNOSIS — Z862 Personal history of diseases of the blood and blood-forming organs and certain disorders involving the immune mechanism: Secondary | ICD-10-CM

## 2021-07-03 DIAGNOSIS — Z01419 Encounter for gynecological examination (general) (routine) without abnormal findings: Secondary | ICD-10-CM | POA: Diagnosis not present

## 2021-07-03 DIAGNOSIS — Q8500 Neurofibromatosis, unspecified: Secondary | ICD-10-CM | POA: Diagnosis not present

## 2021-07-03 DIAGNOSIS — B009 Herpesviral infection, unspecified: Secondary | ICD-10-CM

## 2021-07-03 DIAGNOSIS — Z90710 Acquired absence of both cervix and uterus: Secondary | ICD-10-CM | POA: Diagnosis not present

## 2021-07-03 DIAGNOSIS — Z0000 Encounter for general adult medical examination without abnormal findings: Secondary | ICD-10-CM

## 2021-07-03 NOTE — Progress Notes (Signed)
45 y.o. G1P0010 Single Black or [place] American female here for annual exam.  Doing well.  Denies vaginal bleeding.    Saw hematologist/oncologist who specializes in NF1.  Yearly MMGs recommended which she has been doing.  Pt aware to watch for blood pressure changes.    Dating someone for about a year.  He has four grown children and a 78 year old granddaughter.    Patient's last menstrual period was 05/07/2017.          Sexually active: Yes.    The current method of family planning is status post hysterectomy.    Exercising: No.  Smoker:  no  Health Maintenance: Pap:  not indicated History of abnormal Pap:  no MMG:  04/07/2021 Negative Colonoscopy:  guidelines reviewed TDaP:  pt aware this is overdue   reports that she has never smoked. She has never used smokeless tobacco. She reports that she does not currently use alcohol. She reports that she does not use drugs.  Past Medical History:  Diagnosis Date   Anxiety    slight   Asthma    albuterol rescue inhaler-uses prn, exacerbated by pet dander   History of ectopic pregnancy 1999   S/P RIGHT SALPINGECTOMY   History of herpes simplex type 2 infection 2005   History of uterine fibroid    Neurofibromatosis ([place])    dx is child--  nonmalignant   Pneumonia    years ago   Seasonal allergies     Past Surgical History:  Procedure Laterality Date   CYSTOSCOPY N/A 05/11/2017   Procedure: CYSTOSCOPY;  Surgeon: Megan Salon, MD;  Location: [facility];  Service: Gynecology;  Laterality: N/A;   DILATATION & CURETTAGE/HYSTEROSCOPY WITH MYOSURE N/A 08/07/2016   Procedure: HYSTEROSCOPY WITH MYOSURE;  Surgeon: Megan Salon, MD;  Location: [facility];  Service: Gynecology;  Laterality: N/A;  Fibroid resection   IUD REMOVAL N/A 08/07/2016   Procedure: INTRAUTERINE DEVICE (IUD) REMOVAL;  Surgeon: Megan Salon, MD;  Location[facility];  Service: Gynecology;  Laterality: N/A;   IUD REMOVAL N/A 05/11/2017    Procedure: INTRAUTERINE DEVICE (IUD) REMOVAL;  Surgeon: Megan Salon, MD;  Location: [facility];  Service: Gynecology;  Laterality: N/A;   LAPAROSCOPIC LYSIS OF ADHESIONS  05/11/2017   Procedure: LAPAROSCOPIC LYSIS OF ADHESIONS;  Surgeon: Megan Salon, MD;  Location: [facility];  Service: Gynecology;;  with cautery of endometroisis   LAPAROSCOPY FOR ECTOPIC PREGNANCY  2000   RIGHT SALPINGECTOMY   ROBOT ASSISTED MYOMECTOMY N/A 12/16/2012   Procedure: ROBOTIC ASSISTED MYOMECTOMY;  Surgeon: Governor Specking, MD;  Location: [facility];  Service: Gynecology;  Laterality: N/A;   ROBOTIC ASSISTED LAPAROSCOPIC LYSIS OF ADHESION  12/16/2012   Procedure: ROBOTIC ASSISTED LAPAROSCOPIC LYSIS OF ADHESION;  Surgeon: Governor Specking, MD;  Location: [facility];  Service: Gynecology;;  with excision of perotoneal lesions   TOTAL LAPAROSCOPIC HYSTERECTOMY WITH SALPINGECTOMY Bilateral 05/11/2017   Procedure: HYSTERECTOMY TOTAL LAPAROSCOPIC WITH LEFT SALPINGECTOMY;  Surgeon: Megan Salon, MD;  Location: [facility];  Service: Gynecology;  Laterality: Bilateral;   WISDOM TOOTH EXTRACTION      Current Outpatient Medications  Medication Sig Dispense Refill   albuterol (PROVENTIL HFA;VENTOLIN HFA) 108 (90 BASE) MCG/ACT inhaler Inhale 2 puffs into the lungs every 6 (six) hours as needed for wheezing or shortness of breath.      ALPRAZolam (XANAX) 0.25 MG tablet Take 0.25 mg by mouth daily as needed for anxiety.  Azelastine-Fluticasone 137-50 MCG/ACT SUSP Place 1 spray into the nose in the morning and at bedtime. 23 g 5   cetirizine (ZYRTEC) 10 MG tablet Take 10 mg by mouth daily.     EPINEPHrine 0.3 mg/0.3 mL IJ SOAJ injection Inject 0.3 mLs (0.3 mg total) into the muscle as needed for anaphylaxis. 1 each 2   ibuprofen (ADVIL,MOTRIN) 800 MG tablet Take 1 tablet (800 mg total) by mouth every 8 (eight) hours as needed. (Patient taking differently: Take 800 mg by mouth every 8 (eight) hours as needed for moderate pain.) 30 tablet 1    Ferrous Fumarate (HEMOCYTE - 106 MG FE) 324 (106 Fe) MG TABS tablet Take 1 tablet by mouth daily. (Patient not taking: Reported on 07/03/2021)     montelukast (SINGULAIR) 10 MG tablet Take 1 tablet (10 mg total) by mouth at bedtime. (Patient not taking: Reported on 07/03/2021) 30 tablet 5   Olopatadine HCl 0.2 % SOLN Apply 1 drop to eye daily as needed (itchy/watery eyes). (Patient not taking: Reported on 07/03/2021) 2.5 mL 5   Vitamin D, Ergocalciferol, (DRISDOL) 1.25 MG (50000 UT) CAPS capsule TAKE ONE CAPSULE BY MOUTH EVERY 7 DAYS (Patient not taking: Reported on 07/03/2021) 12 capsule 3   No current facility-administered medications for this visit.    Family History  Adopted: Yes  Problem Relation Age of Onset   Allergies Mother        food   Hypertension Mother    Hyperlipidemia Mother    Heart failure Mother    Thyroid disease Mother    Diabetes Mother    Lung cancer Father    Diabetes Sister    Hypertension Sister    Renal Disease Sister        dialysis   Uterine cancer Paternal Grandmother    Breast cancer Neg Hx     Review of Systems  All other systems reviewed and are negative.  Exam:   BP (!) 130/59 (BP Location: Left Arm, Patient Position: Sitting, Cuff Size: Large)   Pulse 75   Ht 5' 1.5" (1.562 m)   Wt 223 lb 9.6 oz (101.4 kg)   LMP 05/07/2017   BMI 41.56 kg/m   Height: 5' 1.5" (156.2 cm)  General appearance: alert, cooperative and appears stated age Head: Normocephalic, without obvious abnormality, atraumatic Neck: no adenopathy, supple, symmetrical, trachea midline and thyroid normal to inspection and palpation Lungs: clear to auscultation bilaterally Breasts: normal appearance, no masses or tenderness Heart: regular rate and rhythm Abdomen: soft, non-tender; bowel sounds normal; no masses,  no organomegaly Extremities: extremities normal, atraumatic, no cyanosis or edema Skin: Skin color, texture, turgor normal. No rashes or lesions Lymph nodes: Cervical,  supraclavicular, and axillary nodes normal. No abnormal inguinal nodes palpated Neurologic: Grossly normal   Pelvic: External genitalia:  no lesions              Urethra:  normal appearing urethra with no masses, tenderness or lesions              Bartholins and Skenes: normal                 Vagina: normal appearing vagina with normal color and no discharge, no lesions              Cervix: absent              Pap taken: No. Bimanual Exam:  Uterus:  uterus absent  Adnexa: normal adnexa and no mass, fullness, tenderness               Rectovaginal: Confirms               Anus:  normal sphincter tone, no lesions  Chaperone, Octaviano Batty, CMA, was present for exam.  Assessment/Plan: 1. Well woman exam with routine gynecological exam - pap smear not indicated - colon cancer screening guidelines reivewed - MMG up to date - lab work obtained today - Care Gaps reviewed/updated  2. Neurofibromatosis [facility]) - followed by specialist at [facility] and local neurologist  3. H/O: hysterectomy 0 TLH, left salpingectomy, LOA, cystoscopy  4. History of iron deficiency anemia - this has persisted after hysterectomy  5. HSV-2 (herpes simplex virus 2) infection - remote hx  6. Blood tests for routine general physical examination - CBC - Comprehensive metabolic panel - TSH - VITAMIN D 25 Hydroxy (Vit-D Deficiency, Fractures) - Lipid panel

## 2021-07-04 LAB — CBC
Hematocrit: 40.4 % (ref 34.0–46.6)
Hemoglobin: 13.2 g/dL (ref 11.1–15.9)
MCH: 29.7 pg (ref 26.6–33.0)
MCHC: 32.7 g/dL (ref 31.5–35.7)
MCV: 91 fL (ref 79–97)
Platelets: 215 10*3/uL (ref 150–450)
RBC: 4.45 x10E6/uL (ref 3.77–5.28)
RDW: 11.8 % (ref 11.7–15.4)
WBC: 4.7 10*3/uL (ref 3.4–10.8)

## 2021-07-04 LAB — COMPREHENSIVE METABOLIC PANEL
ALT: 15 IU/L (ref 0–32)
AST: 16 IU/L (ref 0–40)
Albumin/Globulin Ratio: 1.8 (ref 1.2–2.2)
Albumin: 4.4 g/dL (ref 3.8–4.8)
Alkaline Phosphatase: 101 IU/L (ref 44–121)
BUN/Creatinine Ratio: 15 (ref 9–23)
BUN: 9 mg/dL (ref 6–24)
Bilirubin Total: 0.7 mg/dL (ref 0.0–1.2)
CO2: 21 mmol/L (ref 20–29)
Calcium: 9 mg/dL (ref 8.7–10.2)
Chloride: 104 mmol/L (ref 96–106)
Creatinine, Ser: 0.62 mg/dL (ref 0.57–1.00)
Globulin, Total: 2.4 g/dL (ref 1.5–4.5)
Glucose: 80 mg/dL (ref 70–99)
Potassium: 4.2 mmol/L (ref 3.5–5.2)
Sodium: 139 mmol/L (ref 134–144)
Total Protein: 6.8 g/dL (ref 6.0–8.5)
eGFR: 113 mL/min/{1.73_m2} (ref >59)

## 2021-07-04 LAB — TSH: TSH: 1.25 u[IU]/mL (ref 0.450–4.500)

## 2021-07-04 LAB — LIPID PANEL
Chol/HDL Ratio: 1.9 ratio (ref 0.0–4.4)
Cholesterol, Total: 158 mg/dL (ref 100–199)
HDL: 85 mg/dL (ref >39)
LDL Chol Calc (NIH): 62 mg/dL (ref 0–99)
Triglycerides: 55 mg/dL (ref 0–149)
VLDL Cholesterol Cal: 11 mg/dL (ref 5–40)

## 2021-07-04 LAB — VITAMIN D 25 HYDROXY (VIT D DEFICIENCY, FRACTURES): Vit D, 25-Hydroxy: 18.2 ng/mL — ABNORMAL LOW (ref 30.0–100.0)

## 2021-07-07 ENCOUNTER — Telehealth (HOSPITAL_BASED_OUTPATIENT_CLINIC_OR_DEPARTMENT_OTHER): Payer: Self-pay

## 2021-07-07 NOTE — Telephone Encounter (Signed)
Called and spoke with patient about lab results and recommendations. Patient expresses understanding. Patient wanted to know if you she can just continue with the once a week vitamin D. She states that she will do better with the once a week, instead of the daily. She also wanted to know if you would consider giving her something to jump start her weight loss. Please advise.

## 2021-07-22 ENCOUNTER — Encounter (HOSPITAL_BASED_OUTPATIENT_CLINIC_OR_DEPARTMENT_OTHER): Payer: Self-pay

## 2021-07-22 MED ORDER — VITAMIN D (ERGOCALCIFEROL) 1.25 MG (50000 UNIT) PO CAPS: ORAL_CAPSULE | ORAL | 3 refills | Status: AC

## 2021-07-23 NOTE — Telephone Encounter (Signed)
Patient states that she will just try to stick with the once a week vitamin D. tbw

## 2021-08-27 DIAGNOSIS — Q8501 Neurofibromatosis, type 1: Secondary | ICD-10-CM | POA: Diagnosis not present

## 2021-09-23 ENCOUNTER — Encounter (HOSPITAL_BASED_OUTPATIENT_CLINIC_OR_DEPARTMENT_OTHER): Payer: Self-pay | Admitting: Obstetrics & Gynecology

## 2021-09-25 ENCOUNTER — Other Ambulatory Visit: Payer: Self-pay

## 2021-09-25 ENCOUNTER — Other Ambulatory Visit (HOSPITAL_COMMUNITY)
Admission: RE | Admit: 2021-09-25 | Discharge: 2021-09-25 | Disposition: A | Discharge status: Home / Self Care | Payer: BC Managed Care – PPO | Source: Ambulatory Visit | Attending: Obstetrics & Gynecology | Admitting: Obstetrics & Gynecology

## 2021-09-25 ENCOUNTER — Ambulatory Visit (HOSPITAL_BASED_OUTPATIENT_CLINIC_OR_DEPARTMENT_OTHER): Payer: BC Managed Care – PPO

## 2021-09-25 DIAGNOSIS — N898 Other specified noninflammatory disorders of vagina: Secondary | ICD-10-CM | POA: Insufficient documentation

## 2021-09-25 MED ORDER — FLUCONAZOLE 150 MG PO TABS: 150.0000 mg | ORAL_TABLET | Freq: Once | ORAL | 0 refills | Status: AC

## 2021-09-25 NOTE — Progress Notes (Signed)
Patient came in today to do an aptima self swab. Patient is complaining of itching. No discharge is present. tbw

## 2021-09-26 LAB — CERVICOVAGINAL ANCILLARY ONLY
Bacterial Vaginitis (gardnerella): NEGATIVE
Candida Glabrata: NEGATIVE
Candida Vaginitis: NEGATIVE
Comment: NEGATIVE
Comment: NEGATIVE
Comment: NEGATIVE

## 2021-12-01 ENCOUNTER — Emergency Department (HOSPITAL_BASED_OUTPATIENT_CLINIC_OR_DEPARTMENT_OTHER): Payer: BC Managed Care – PPO

## 2021-12-01 ENCOUNTER — Emergency Department (HOSPITAL_BASED_OUTPATIENT_CLINIC_OR_DEPARTMENT_OTHER)
Admission: EM | Admit: 2021-12-01 | Discharge: 2021-12-01 | Disposition: A | Discharge status: Home / Self Care | Payer: BC Managed Care – PPO | Attending: Emergency Medicine | Admitting: Emergency Medicine

## 2021-12-01 ENCOUNTER — Other Ambulatory Visit: Payer: Self-pay

## 2021-12-01 ENCOUNTER — Encounter (HOSPITAL_BASED_OUTPATIENT_CLINIC_OR_DEPARTMENT_OTHER): Payer: Self-pay | Admitting: Emergency Medicine

## 2021-12-01 DIAGNOSIS — J45909 Unspecified asthma, uncomplicated: Secondary | ICD-10-CM | POA: Diagnosis not present

## 2021-12-01 DIAGNOSIS — S3992XA Unspecified injury of lower back, initial encounter: Secondary | ICD-10-CM | POA: Diagnosis not present

## 2021-12-01 DIAGNOSIS — Z91040 Latex allergy status: Secondary | ICD-10-CM | POA: Diagnosis not present

## 2021-12-01 DIAGNOSIS — M549 Dorsalgia, unspecified: Secondary | ICD-10-CM | POA: Diagnosis not present

## 2021-12-01 DIAGNOSIS — M545 Low back pain, unspecified: Secondary | ICD-10-CM | POA: Diagnosis not present

## 2021-12-01 DIAGNOSIS — M546 Pain in thoracic spine: Secondary | ICD-10-CM | POA: Insufficient documentation

## 2021-12-01 DIAGNOSIS — S199XXA Unspecified injury of neck, initial encounter: Secondary | ICD-10-CM | POA: Diagnosis not present

## 2021-12-01 DIAGNOSIS — M542 Cervicalgia: Secondary | ICD-10-CM | POA: Insufficient documentation

## 2021-12-01 DIAGNOSIS — R52 Pain, unspecified: Secondary | ICD-10-CM

## 2021-12-01 MED ORDER — CYCLOBENZAPRINE HCL 10 MG PO TABS
10.0000 mg | ORAL_TABLET | Freq: Once | ORAL | Status: AC
  Administered 2021-12-01: 10 mg via ORAL
  Filled 2021-12-01: qty 1

## 2021-12-01 MED ORDER — CYCLOBENZAPRINE HCL 10 MG PO TABS: 10.0000 mg | ORAL_TABLET | Freq: Two times a day (BID) | ORAL | 0 refills | Status: DC | PRN

## 2021-12-01 MED ORDER — KETOROLAC TROMETHAMINE 15 MG/ML IJ SOLN
15.0000 mg | Freq: Once | INTRAMUSCULAR | Status: AC
  Administered 2021-12-01: 15 mg via INTRAVENOUS
  Filled 2021-12-01: qty 1
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
## 2021-12-01 NOTE — Discharge Instructions (Addendum)
Return to ED with any new symptoms such as groin numbness, lower extremity weakness, bowel or bladder dysfunction ?Please follow-up with your PCP as we discussed ?Please take the next few days and stay off your feet, rest.  ?You may take ibuprofen for any ongoing back pain.  You also may take the muscle relaxer I prescribed you. ?
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
## 2022-02-11 DIAGNOSIS — Z23 Encounter for immunization: Secondary | ICD-10-CM | POA: Diagnosis not present

## 2022-02-11 DIAGNOSIS — E7800 Pure hypercholesterolemia, unspecified: Secondary | ICD-10-CM | POA: Diagnosis not present

## 2022-02-11 DIAGNOSIS — Z0000 Encounter for general adult medical examination without abnormal findings: Secondary | ICD-10-CM | POA: Diagnosis not present

## 2022-02-18 DIAGNOSIS — M545 Low back pain, unspecified: Secondary | ICD-10-CM | POA: Diagnosis not present

## 2022-02-25 DIAGNOSIS — M545 Low back pain, unspecified: Secondary | ICD-10-CM | POA: Diagnosis not present

## 2022-03-02 DIAGNOSIS — M545 Low back pain, unspecified: Secondary | ICD-10-CM | POA: Diagnosis not present

## 2022-03-11 DIAGNOSIS — M545 Low back pain, unspecified: Secondary | ICD-10-CM | POA: Diagnosis not present

## 2022-03-19 ENCOUNTER — Other Ambulatory Visit (HOSPITAL_BASED_OUTPATIENT_CLINIC_OR_DEPARTMENT_OTHER): Payer: Self-pay | Admitting: Family Medicine

## 2022-03-19 DIAGNOSIS — Z1231 Encounter for screening mammogram for malignant neoplasm of breast: Secondary | ICD-10-CM

## 2022-03-24 DIAGNOSIS — M545 Low back pain, unspecified: Secondary | ICD-10-CM | POA: Diagnosis not present

## 2022-03-30 ENCOUNTER — Ambulatory Visit (AMBULATORY_SURGERY_CENTER): Payer: Self-pay | Admitting: *Deleted

## 2022-03-30 VITALS — Ht 61.5 in | Wt 229.0 lb

## 2022-03-30 DIAGNOSIS — Z1211 Encounter for screening for malignant neoplasm of colon: Secondary | ICD-10-CM

## 2022-03-30 MED ORDER — PLENVU 140 G PO SOLR: 1.0000 | Freq: Once | ORAL | 0 refills | Status: AC

## 2022-03-30 NOTE — Progress Notes (Signed)
Patient is here in-person for PV. Patient denies any allergies to eggs or soy. Patient denies any problems with anesthesia/sedation. Patient is not on any oxygen at home. Patient is not taking any diet/weight loss medications or blood thinners. Went over procedure prep instructions with the patient. Patient is aware of our care-partner policy. Plenvu sample given to pt.    EMMI education assigned to the patient for the procedure, sent to [facility].

## 2022-04-10 ENCOUNTER — Encounter: Payer: Self-pay | Admitting: Nutrition

## 2022-04-20 ENCOUNTER — Encounter: Payer: Self-pay | Admitting: Internal Medicine

## 2022-04-20 ENCOUNTER — Ambulatory Visit (AMBULATORY_SURGERY_CENTER): Payer: BC Managed Care – PPO | Admitting: Internal Medicine

## 2022-04-20 ENCOUNTER — Encounter (HOSPITAL_BASED_OUTPATIENT_CLINIC_OR_DEPARTMENT_OTHER): Payer: BC Managed Care – PPO | Admitting: Radiology

## 2022-04-20 VITALS — BP 122/79 | HR 89 | Temp 98.2°F | Resp 15 | Ht 61.0 in | Wt 229.0 lb

## 2022-04-20 DIAGNOSIS — D124 Benign neoplasm of descending colon: Secondary | ICD-10-CM

## 2022-04-20 DIAGNOSIS — K5140 Inflammatory polyps of colon without complications: Secondary | ICD-10-CM

## 2022-04-20 DIAGNOSIS — D123 Benign neoplasm of transverse colon: Secondary | ICD-10-CM

## 2022-04-20 DIAGNOSIS — Z1211 Encounter for screening for malignant neoplasm of colon: Secondary | ICD-10-CM

## 2022-04-20 DIAGNOSIS — K529 Noninfective gastroenteritis and colitis, unspecified: Secondary | ICD-10-CM | POA: Diagnosis not present

## 2022-04-20 DIAGNOSIS — Z1231 Encounter for screening mammogram for malignant neoplasm of breast: Secondary | ICD-10-CM

## 2022-04-20 MED ORDER — SODIUM CHLORIDE 0.9 % IV SOLN: 500.0000 mL | Freq: Once | INTRAVENOUS | Status: DC

## 2022-04-20 NOTE — Patient Instructions (Signed)
Handout on polyps and diverticulosis given. No aspirin, ibuprofen, naproxen, or other non-steroidal anti inflammatory drugs for 2 weeks.   YOU HAD AN ENDOSCOPIC PROCEDURE TODAY AT [facility]:   Refer to the procedure report that was given to you for any specific questions about what was found during the examination.  If the procedure report does not answer your questions, please call your gastroenterologist to clarify.  If you requested that your care partner not be given the details of your procedure findings, then the procedure report has been included in a sealed envelope for you to review at your convenience later.  YOU SHOULD EXPECT: Some feelings of bloating in the abdomen. Passage of more gas than usual.  Walking can help get rid of the air that was put into your GI tract during the procedure and reduce the bloating. If you had a lower endoscopy (such as a colonoscopy or flexible sigmoidoscopy) you may notice spotting of blood in your stool or on the toilet paper. If you underwent a bowel prep for your procedure, you may not have a normal bowel movement for a few days.  Please Note:  You might notice some irritation and congestion in your nose or some drainage.  This is from the oxygen used during your procedure.  There is no need for concern and it should clear up in a day or so.  SYMPTOMS TO REPORT IMMEDIATELY:  Following lower endoscopy (colonoscopy or flexible sigmoidoscopy):  Excessive amounts of blood in the stool  Significant tenderness or worsening of abdominal pains  Swelling of the abdomen that is new, acute  Fever of 100F or higher   For urgent or emergent issues, a gastroenterologist can be reached at any hour by calling [phone]. Do not use MyChart messaging for urgent concerns.    DIET:  We do recommend a small meal at first, but then you may proceed to your regular diet.  Drink plenty of fluids but you should avoid alcoholic beverages for 24  hours.  ACTIVITY:  You should plan to take it easy for the rest of today and you should NOT DRIVE or use heavy machinery until tomorrow (because of the sedation medicines used during the test).    FOLLOW UP: Our staff will call the number listed on your records the next business day following your procedure.  We will call around 7:15- 8:00 am to check on you and address any questions or concerns that you may have regarding the information given to you following your procedure. If we do not reach you, we will leave a message.  If you develop any symptoms (ie: fever, flu-like symptoms, shortness of breath, cough etc.) before then, please call [phone].  If you test positive for Covid 19 in the 2 weeks post procedure, please call and report this information to [place].    If any biopsies were taken you will be contacted by phone or by letter within the next 1-3 weeks.  Please call us at [phone] if you have not heard about the biopsies in 3 weeks.    SIGNATURES/CONFIDENTIALITY: You and/or your care partner have signed paperwork which will be entered into your electronic medical record.  These signatures attest to the fact that that the information above on your After Visit Summary has been reviewed and is understood.  Full responsibility of the confidentiality of this discharge information lies with you and/or your care-partner.

## 2022-04-20 NOTE — Op Note (Signed)
[facility] Patient Name: Lacey Lee Procedure Date: 04/20/2022 11:02 AM MRN: [id number] Endoscopist: Jerene Bears , MD Age: 46 Referring MD:  Date of Birth: 1976/01/02 Gender: Female Account #: [id number] Procedure:                Colonoscopy Indications:              Screening for colorectal malignant neoplasm, This                            is the patient's first colonoscopy Medicines:                Monitored Anesthesia Care Procedure:                Pre-Anesthesia Assessment:                           - Prior to the procedure, a History and Physical                            was performed, and patient medications and                            allergies were reviewed. The patient's tolerance of                            previous anesthesia was also reviewed. The risks                            and benefits of the procedure and the sedation                            options and risks were discussed with the patient.                            All questions were answered, and informed consent                            was obtained. Prior Anticoagulants: The patient has                            taken no previous anticoagulant or antiplatelet                            agents. ASA Grade Assessment: II - A patient with                            mild systemic disease. After reviewing the risks                            and benefits, the patient was deemed in                            satisfactory condition to undergo the procedure.  After obtaining informed consent, the colonoscope                            was passed under direct vision. Throughout the                            procedure, the patient's blood pressure, pulse, and                            oxygen saturations were monitored continuously. The                            Olympus Scope [id number] was introduced through the                            anus and advanced to the  terminal ileum. The                            colonoscopy was performed without difficulty. The                            patient tolerated the procedure well. The quality                            of the bowel preparation was excellent. The                            ileocecal valve, appendiceal orifice, and rectum                            were photographed. Scope In: 11:27:26 AM Scope Out: 12:01:08 PM Scope Withdrawal Time: 0 hours 29 minutes 54 seconds  Total Procedure Duration: 0 hours 33 minutes 42 seconds  Findings:                 The digital rectal exam was normal.                           The terminal ileum appeared normal.                           A 5 mm polyp was found in the transverse colon. The                            polyp was pedunculated. The polyp was removed with                            a cold snare. Resection and retrieval were complete.                           A 20-25 mm polyp was found in the descending colon.                            The polyp was pedunculated with 2 heads. An  endoloop was maneuvered over the polyp stalk and                            closed at the mucosal attachment prior to removal                            in order to prevent bleeding. Resection with hot                            snare above the endoloop and retrieval were                            complete. Biopsies from the polypectomy base in an                            area of nodularity were taken with a cold forceps                            for histology. Area distal to the polypectomy site                            was tattooed with an injection of 2 mL of Spot                            (carbon black).                           A few small-mouthed diverticula were found in the                            descending colon and transverse colon.                           The retroflexed view of the distal rectum and anal                             verge was normal and showed no anal or rectal                            abnormalities. Complications:            No immediate complications. Estimated Blood Loss:     Estimated blood loss was minimal. Impression:               - The examined portion of the ileum was normal.                           - One 5 mm polyp in the transverse colon, removed                            with a cold snare. Resected and retrieved.                           - One 20  to 25 mm polyp in the descending colon.                            Resected and retrieved. Biopsied. Tattooed.                           - Mild diverticulosis in the descending colon and                            in the transverse colon.                           - The distal rectum and anal verge are normal on                            retroflexion view. Recommendation:           - Patient has a contact number available for                            emergencies. The signs and symptoms of potential                            delayed complications were discussed with the                            patient. Return to normal activities tomorrow.                            Written discharge instructions were provided to the                            patient.                           - Resume previous diet.                           - Continue present medications.                           - Await pathology results.                           - Repeat colonoscopy is recommended. The                            colonoscopy date will be determined after pathology                            results from today's exam become available for                            review.                           - No aspirin, ibuprofen, naproxen,  or other                            non-steroidal anti-inflammatory drugs for 2 weeks                            after polyp removal. Jerene Bears, MD 04/20/2022 12:18:57 PM This report has been signed  electronically.

## 2022-04-20 NOTE — Progress Notes (Signed)
Called to room to assist during endoscopic procedure.  Patient ID and intended procedure confirmed with present staff. Received instructions for my participation in the procedure from the performing physician.  

## 2022-04-20 NOTE — Progress Notes (Signed)
GASTROENTEROLOGY PROCEDURE H&P NOTE   Primary Care Physician: Alroy Dust, [name], MD    Reason for Procedure:  Colon cancer screening  Plan:    Colonoscopy  Patient is appropriate for endoscopic procedure(s) in the ambulatory ([facility]) setting.  The nature of the procedure, as well as the risks, benefits, and alternatives were carefully and thoroughly reviewed with the patient. Ample time for discussion and questions allowed. The patient understood, was satisfied, and agreed to proceed.     HPI: Lacey Lee is a 46 y.o. female who presents for screening colonoscopy.  Medical history as below.  Tolerated the prep.  No recent chest pain or shortness of breath.  No abdominal pain today.  Past Medical History:  Diagnosis Date   Anemia    Anxiety    slight   Asthma    albuterol rescue inhaler-uses prn, exacerbated by pet dander   History of ectopic pregnancy 1999   S/P RIGHT SALPINGECTOMY   History of herpes simplex type 2 infection 2005   History of uterine fibroid    Neurofibromatosis ([place])    dx is child--  nonmalignant   Pneumonia    years ago   Seasonal allergies     Past Surgical History:  Procedure Laterality Date   CYSTOSCOPY N/A 05/11/2017   Procedure: CYSTOSCOPY;  Surgeon: Megan Salon, MD;  Location: [facility];  Service: Gynecology;  Laterality: N/A;   DILATATION & CURETTAGE/HYSTEROSCOPY WITH MYOSURE N/A 08/07/2016   Procedure: HYSTEROSCOPY WITH MYOSURE;  Surgeon: Megan Salon, MD;  Location: [facility];  Service: Gynecology;  Laterality: N/A;  Fibroid resection   IUD REMOVAL N/A 08/07/2016   Procedure: INTRAUTERINE DEVICE (IUD) REMOVAL;  Surgeon: Megan Salon, MD;  Location: [facility];  Service: Gynecology;  Laterality: N/A;   IUD REMOVAL N/A 05/11/2017   Procedure: INTRAUTERINE DEVICE (IUD) REMOVAL;  Surgeon: Megan Salon, MD;  Location: [facility];  Service: Gynecology;  Laterality: N/A;   LAPAROSCOPIC LYSIS OF ADHESIONS   05/11/2017   Procedure: LAPAROSCOPIC LYSIS OF ADHESIONS;  Surgeon: Megan Salon, MD;  Location: [facility];  Service: Gynecology;;  with cautery of endometroisis   LAPAROSCOPY FOR ECTOPIC PREGNANCY  2000   RIGHT SALPINGECTOMY   ROBOT ASSISTED MYOMECTOMY N/A 12/16/2012   Procedure: ROBOTIC ASSISTED MYOMECTOMY;  Surgeon: Governor Specking, MD;  Location: [facility];  Service: Gynecology;  Laterality: N/A;   ROBOTIC ASSISTED LAPAROSCOPIC LYSIS OF ADHESION  12/16/2012   Procedure: ROBOTIC ASSISTED LAPAROSCOPIC LYSIS OF ADHESION;  Surgeon: Governor Specking, MD;  Location: [facility];  Service: Gynecology;;  with excision of perotoneal lesions   TOTAL LAPAROSCOPIC HYSTERECTOMY WITH SALPINGECTOMY Bilateral 05/11/2017   Procedure: HYSTERECTOMY TOTAL LAPAROSCOPIC WITH LEFT SALPINGECTOMY;  Surgeon: Megan Salon, MD;  Location: [facility];  Service: Gynecology;  Laterality: Bilateral;   WISDOM TOOTH EXTRACTION      Prior to Admission medications   Medication Sig Start Date End Date Taking? Authorizing Provider  cetirizine (ZYRTEC) 10 MG tablet Take 10 mg by mouth daily.   Yes [provider]  albuterol (PROVENTIL HFA;VENTOLIN HFA) 108 (90 BASE) MCG/ACT inhaler Inhale 2 puffs into the lungs every 6 (six) hours as needed for wheezing or shortness of breath.     [provider]  ALPRAZolam Duanne Moron) 0.25 MG tablet Take 0.25 mg by mouth daily as needed for anxiety.  Patient not taking: Reported on 03/30/2022 04/30/16   [provider]  Azelastine-Fluticasone 137-50 MCG/ACT SUSP Place 1 spray into the nose  in the morning and at bedtime. 04/19/20   Garnet Sierras, DO  EPINEPHrine 0.3 mg/0.3 mL IJ SOAJ injection Inject 0.3 mLs (0.3 mg total) into the muscle as needed for anaphylaxis. Patient not taking: Reported on 03/30/2022 04/19/20   Garnet Sierras, DO  Ferrous Fumarate (HEMOCYTE - 106 MG FE) 324 (106 Fe) MG TABS tablet Take 1 tablet by mouth daily. Patient not taking: Reported on 07/03/2021    [provider]  ibuprofen (ADVIL,MOTRIN) 800 MG tablet Take 1 tablet (800 mg total) by mouth every 8 (eight) hours as needed. Patient not taking: Reported on 03/30/2022 01/09/17   Megan Salon, MD  Olopatadine HCl 0.2 % SOLN Apply 1 drop to eye daily as needed (itchy/watery eyes). 04/19/20   Garnet Sierras, DO  Vitamin D, Ergocalciferol, (DRISDOL) 1.25 MG (50000 UNIT) CAPS capsule TAKE ONE CAPSULE BY MOUTH EVERY 7 DAYS 07/22/21   Megan Salon, MD    Current Outpatient Medications  Medication Sig Dispense Refill   cetirizine (ZYRTEC) 10 MG tablet Take 10 mg by mouth daily.     albuterol (PROVENTIL HFA;VENTOLIN HFA) 108 (90 BASE) MCG/ACT inhaler Inhale 2 puffs into the lungs every 6 (six) hours as needed for wheezing or shortness of breath.      ALPRAZolam (XANAX) 0.25 MG tablet Take 0.25 mg by mouth daily as needed for anxiety.  (Patient not taking: Reported on 03/30/2022)     Azelastine-Fluticasone 137-50 MCG/ACT SUSP Place 1 spray into the nose in the morning and at bedtime. 23 g 5   EPINEPHrine 0.3 mg/0.3 mL IJ SOAJ injection Inject 0.3 mLs (0.3 mg total) into the muscle as needed for anaphylaxis. (Patient not taking: Reported on 03/30/2022) 1 each 2   Ferrous Fumarate (HEMOCYTE - 106 MG FE) 324 (106 Fe) MG TABS tablet Take 1 tablet by mouth daily. (Patient not taking: Reported on 07/03/2021)     ibuprofen (ADVIL,MOTRIN) 800 MG tablet Take 1 tablet (800 mg total) by mouth every 8 (eight) hours as needed. (Patient not taking: Reported on 03/30/2022) 30 tablet 1   Olopatadine HCl 0.2 % SOLN Apply 1 drop to eye daily as needed (itchy/watery eyes). 2.5 mL 5   Vitamin D, Ergocalciferol, (DRISDOL) 1.25 MG (50000 UNIT) CAPS capsule TAKE ONE CAPSULE BY MOUTH EVERY 7 DAYS 12 capsule 3   Current Facility-Administered Medications  Medication Dose Route Frequency Provider Last Rate Last Admin   0.9 %  sodium chloride infusion  500 mL Intravenous Once Enez Monahan, Lajuan Lines, MD        Allergies as of 04/20/2022 - Review  Complete 04/20/2022  Allergen Reaction Noted   Latex Other (See Comments) 01/10/2013   Other Hives and Swelling 11/13/2014   Tramadol Nausea And Vomiting 02/18/2016    Family History  Adopted: Yes  Problem Relation Age of Onset   Colon polyps Mother    Allergies Mother        food   Hypertension Mother    Hyperlipidemia Mother    Heart failure Mother    Thyroid disease Mother    Diabetes Mother    Lung cancer Father    Diabetes Sister    Hypertension Sister    Renal Disease Sister        dialysis   Colon polyps Maternal Aunt    Uterine cancer Paternal Grandmother    Breast cancer Neg Hx    Colon cancer Neg Hx    Esophageal cancer Neg Hx    Stomach cancer  Neg Hx    Rectal cancer Neg Hx     Social History   Socioeconomic History   Marital status: Single    Spouse name: Not on file   Number of children: 0   Years of education: Not on file   Highest education level: Not on file  Occupational History   Occupation: [employer]: [place] Use   Smoking status: Never   Smokeless tobacco: Never  Vaping Use   Vaping Use: Never used  Substance and Sexual Activity   Alcohol use: Yes    Alcohol/week: 2.0 - 3.0 standard drinks of alcohol    Types: 2 - 3 Standard drinks or equivalent per week   Drug use: No   Sexual activity: Yes    Partners: Male    Birth control/protection: Surgical    Comment: White 05/11/17   Other Topics Concern   Not on file  Social History Narrative   Lives at home with her sister   Right handed   Caffeine: 6 cups daily   Social Determinants of Health   Financial Resource Strain: Not on file  Food Insecurity: Not on file  Transportation Needs: Not on file  Physical Activity: Not on file  Stress: Not on file  Social Connections: Not on file  Intimate Partner Violence: Not on file    Physical Exam: Vital signs in last 24 hours: '@BP'$  138/84   Pulse 77   Temp 98.2 F (36.8 C)   Ht '5\' 1"'$   (1.549 m)   Wt 229 lb (103.9 kg)   LMP 05/07/2017   SpO2 98%   BMI 43.27 kg/m  GEN: NAD EYE: Sclerae anicteric ENT: MMM CV: Non-tachycardic Pulm: CTA b/l GI: Soft, NT/ND NEURO:  Alert & Oriented x 3   Zenovia Jarred, MD [facility]  04/20/2022 10:59 AM

## 2022-04-20 NOTE — Progress Notes (Signed)
Pt's states no medical or surgical changes since previsit or office visit. 

## 2022-04-20 NOTE — Progress Notes (Signed)
Sedate, gd SR, tolerated procedure well, VSS, report to RN 

## 2022-04-21 ENCOUNTER — Telehealth: Payer: Self-pay | Admitting: *Deleted

## 2022-04-21 NOTE — Telephone Encounter (Signed)
  Follow up Call-     04/20/2022   10:39 AM  Call back number  Post procedure Call Back phone  # [phone]  Permission to leave phone message Yes     Patient questions:  Do you have a fever, pain , or abdominal swelling? No. Pain Score  0 *  Have you tolerated food without any problems? Yes.    Have you been able to return to your normal activities? Yes.    Do you have any questions about your discharge instructions: Diet   No. Medications  No. Follow up visit  No.  Do you have questions or concerns about your Care? No.  Actions: * If pain score is 4 or above: No action needed, pain <4.

## 2022-04-24 ENCOUNTER — Ambulatory Visit (HOSPITAL_BASED_OUTPATIENT_CLINIC_OR_DEPARTMENT_OTHER)
Admission: RE | Admit: 2022-04-24 | Discharge: 2022-04-24 | Disposition: A | Discharge status: Home / Self Care | Payer: BC Managed Care – PPO | Source: Ambulatory Visit | Attending: Family Medicine | Admitting: Family Medicine

## 2022-04-24 DIAGNOSIS — Z1231 Encounter for screening mammogram for malignant neoplasm of breast: Secondary | ICD-10-CM | POA: Diagnosis not present

## 2022-05-05 ENCOUNTER — Ambulatory Visit
Admission: RE | Admit: 2022-05-05 | Discharge: 2022-05-05 | Disposition: A | Discharge status: Home / Self Care | Payer: BC Managed Care – PPO | Source: Ambulatory Visit | Attending: Family Medicine | Admitting: Family Medicine

## 2022-05-05 VITALS — BP 116/79 | HR 100 | Temp 98.4°F | Resp 18

## 2022-05-05 DIAGNOSIS — J069 Acute upper respiratory infection, unspecified: Secondary | ICD-10-CM

## 2022-05-05 MED ORDER — BENZONATATE 100 MG PO CAPS: 100.0000 mg | ORAL_CAPSULE | Freq: Three times a day (TID) | ORAL | 0 refills | Status: DC | PRN

## 2022-05-05 NOTE — Discharge Instructions (Addendum)
Take benzonatate 100 mg, 1 tab every 8 hours as needed for cough.  Continue your other home medications you have been taking for the symptoms.   You have been swabbed for COVID, and the test will result in the next 24 hours. Our staff will call you if positive. If the test is positive, you should quarantine for 5 days from the start of your symptoms

## 2022-05-05 NOTE — ED Triage Notes (Signed)
The pt states since Friday she has had back pain, congestion and facial pain.  Home interventions: tylenol, sudafed

## 2022-05-05 NOTE — ED Provider Notes (Signed)
[facility]    CSN: [id number] Arrival date & time: 05/05/22  1626      History   Chief Complaint Chief Complaint  Patient presents with   Facial Pain   Nasal Congestion    HPI Lacey Lee is a 46 y.o. female.   HPI Here with congestion and postnasal drainage, right sinus pressure and right ear pain.  Symptoms have been going on since August 4.  Is also had some myalgia.  No fever or chills.  She has felt tired and a little short of breath some.  No history of asthma.  No vomiting or diarrhea  Past Medical History:  Diagnosis Date   Anemia    Anxiety    slight   Asthma    albuterol rescue inhaler-uses prn, exacerbated by pet dander   History of ectopic pregnancy 1999   S/P RIGHT SALPINGECTOMY   History of herpes simplex type 2 infection 2005   History of uterine fibroid    Neurofibromatosis ([place])    dx is child--  nonmalignant   Pneumonia    years ago   Seasonal allergies     Patient Active Problem List   Diagnosis Date Noted   H/O: hysterectomy 07/03/2021   Allergy to alpha-gal 04/21/2020   Allergic conjunctivitis of both eyes 04/21/2020   Lumbar radiculopathy, right 06/13/2019   Patellofemoral arthritis of right knee 02/27/2019   IDA (iron deficiency anemia) 10/28/2018   Cervical stenosis of spinal canal 02/07/2018   Neurofibromatosis ([place]) 02/04/2017   Greater trochanteric bursitis of left hip 08/31/2016   Other allergic rhinitis 05/29/2012   Mild intermittent reactive airway disease 05/29/2012    Past Surgical History:  Procedure Laterality Date   CYSTOSCOPY N/A 05/11/2017   Procedure: CYSTOSCOPY;  Surgeon: Megan Salon, MD;  Location: [facility];  Service: Gynecology;  Laterality: N/A;   DILATATION & CURETTAGE/HYSTEROSCOPY WITH MYOSURE N/A 08/07/2016   Procedure: HYSTEROSCOPY WITH MYOSURE;  Surgeon: Megan Salon, MD;  Location[facility];  Service: Gynecology;  Laterality: N/A;  Fibroid resection   IUD REMOVAL  N/A 08/07/2016   Procedure: INTRAUTERINE DEVICE (IUD) REMOVAL;  Surgeon: Megan Salon, MD;  Location: [facility];  Service: Gynecology;  Laterality: N/A;   IUD REMOVAL N/A 05/11/2017   Procedure: INTRAUTERINE DEVICE (IUD) REMOVAL;  Surgeon: Megan Salon, MD;  Location: [facility];  Service: Gynecology;  Laterality: N/A;   LAPAROSCOPIC LYSIS OF ADHESIONS  05/11/2017   Procedure: LAPAROSCOPIC LYSIS OF ADHESIONS;  Surgeon: Megan Salon, MD;  Location: [facility];  Service: Gynecology;;  with cautery of endometroisis   LAPAROSCOPY FOR ECTOPIC PREGNANCY  2000   RIGHT SALPINGECTOMY   ROBOT ASSISTED MYOMECTOMY N/A 12/16/2012   Procedure: ROBOTIC ASSISTED MYOMECTOMY;  Surgeon: Governor Specking, MD;  Location: [facility];  Service: Gynecology;  Laterality: N/A;   ROBOTIC ASSISTED LAPAROSCOPIC LYSIS OF ADHESION  12/16/2012   Procedure: ROBOTIC ASSISTED LAPAROSCOPIC LYSIS OF ADHESION;  Surgeon: Governor Specking, MD;  Location: [facility];  Service: Gynecology;;  with excision of perotoneal lesions   TOTAL LAPAROSCOPIC HYSTERECTOMY WITH SALPINGECTOMY Bilateral 05/11/2017   Procedure: HYSTERECTOMY TOTAL LAPAROSCOPIC WITH LEFT SALPINGECTOMY;  Surgeon: Megan Salon, MD;  Location: [facility];  Service: Gynecology;  Laterality: Bilateral;   WISDOM TOOTH EXTRACTION      OB History     Gravida  1   Para  0   Term  0   Preterm  0   AB  1  Living  0      SAB  0   IAB  0   Ectopic  1   Multiple  0   Live Births  0            Home Medications    Prior to Admission medications   Medication Sig Start Date End Date Taking? Authorizing Provider  benzonatate (TESSALON) 100 MG capsule Take 1 capsule (100 mg total) by mouth 3 (three) times daily as needed for cough. 05/05/22  Yes Ryler Laskowski, Gwenlyn Perking, MD  albuterol (PROVENTIL HFA;VENTOLIN HFA) 108 (90 BASE) MCG/ACT inhaler Inhale 2 puffs into the lungs every 6 (six) hours as needed for wheezing or shortness of breath.     [provider]   Azelastine-Fluticasone 137-50 MCG/ACT SUSP Place 1 spray into the nose in the morning and at bedtime. 04/19/20   Garnet Sierras, DO  cetirizine (ZYRTEC) 10 MG tablet Take 10 mg by mouth daily.    [provider]  Olopatadine HCl 0.2 % SOLN Apply 1 drop to eye daily as needed (itchy/watery eyes). 04/19/20   Garnet Sierras, DO  Vitamin D, Ergocalciferol, (DRISDOL) 1.25 MG (50000 UNIT) CAPS capsule TAKE ONE CAPSULE BY MOUTH EVERY 7 DAYS 07/22/21   Megan Salon, MD    Family History Family History  Adopted: Yes  Problem Relation Age of Onset   Colon polyps Mother    Allergies Mother        food   Hypertension Mother    Hyperlipidemia Mother    Heart failure Mother    Thyroid disease Mother    Diabetes Mother    Lung cancer Father    Diabetes Sister    Hypertension Sister    Renal Disease Sister        dialysis   Colon polyps Maternal Aunt    Uterine cancer Paternal Grandmother    Breast cancer Neg Hx    Colon cancer Neg Hx    Esophageal cancer Neg Hx    Stomach cancer Neg Hx    Rectal cancer Neg Hx     Social History Social History   Tobacco Use   Smoking status: Never   Smokeless tobacco: Never  Vaping Use   Vaping Use: Never used  Substance Use Topics   Alcohol use: Yes    Alcohol/week: 2.0 - 3.0 standard drinks of alcohol    Types: 2 - 3 Standard drinks or equivalent per week   Drug use: No     Allergies   Latex, Other, and Tramadol   Review of Systems Review of Systems   Physical Exam Triage Vital Signs ED Triage Vitals  Enc Vitals Group     BP 05/05/22 1652 116/79     Pulse Rate 05/05/22 1652 100     Resp 05/05/22 1652 18     Temp 05/05/22 1652 98.4 F (36.9 C)     Temp Source 05/05/22 1652 Oral     SpO2 05/05/22 1652 98 %     Weight --      Height --      Head Circumference --      Peak Flow --      Pain Score 05/05/22 1651 3     Pain Loc --      Pain Edu? --      Excl. in [place]? --    No data found.  Updated Vital Signs BP 116/79 (BP  Location: Right Arm)   Pulse 100   Temp  98.4 F (36.9 C) (Oral)   Resp 18   LMP 05/07/2017   SpO2 98%   Visual Acuity Right Eye Distance:   Left Eye Distance:   Bilateral Distance:    Right Eye Near:   Left Eye Near:    Bilateral Near:     Physical Exam Vitals reviewed.  Constitutional:      General: She is not in acute distress.    Appearance: She is not ill-appearing, toxic-appearing or diaphoretic.  HENT:     Right Ear: Tympanic membrane and ear canal normal.     Left Ear: Tympanic membrane and ear canal normal.     Nose: Congestion present.     Mouth/Throat:     Mouth: Mucous membranes are moist.     Comments: There is mild erythema of the posterior oropharynx and some white mucus draining.  There is no tonsillar hypertrophy or asymmetry Eyes:     Extraocular Movements: Extraocular movements intact.     Conjunctiva/sclera: Conjunctivae normal.     Pupils: Pupils are equal, round, and reactive to light.  Cardiovascular:     Rate and Rhythm: Normal rate and regular rhythm.     Heart sounds: No murmur heard. Pulmonary:     Effort: Pulmonary effort is normal. No respiratory distress.     Breath sounds: No stridor. No wheezing, rhonchi or rales.  Musculoskeletal:     Cervical back: Neck supple.  Lymphadenopathy:     Cervical: No cervical adenopathy.  Skin:    Coloration: Skin is not jaundiced or pale.  Neurological:     General: No focal deficit present.     Mental Status: She is alert and oriented to person, place, and time.  Psychiatric:        Behavior: Behavior normal.      UC Treatments / Results  Labs (all labs ordered are listed, but only abnormal results are displayed) Labs Reviewed  NOVEL CORONAVIRUS, NAA    EKG   Radiology No results found.  Procedures Procedures (including critical care time)  Medications Ordered in UC Medications - No data to display  Initial Impression / Assessment and Plan / UC Course  I have reviewed the triage  vital signs and the nursing notes.  Pertinent labs & imaging results that were available during my care of the patient were reviewed by me and considered in my medical decision making (see chart for details).     I discussed with the patient that she has some type of viral upper respiratory infection.  I do not think she meets criteria for acute sinusitis that would be considered bacterial, having had symptoms for 4 days only.  She is swabbed for COVID today, and if positive she would benefit from a prescription of Paxlovid unless there are interactions with any of her chronic medications.  Last creatinine in epic is normal  Tessalon Perles are sent in for cough; she states that Sudafed and Tylenol are helping her headache, so no additional prescriptions are sent in at this time Final Clinical Impressions(s) / UC Diagnoses   Final diagnoses:  Viral URI with cough     Discharge Instructions      Take benzonatate 100 mg, 1 tab every 8 hours as needed for cough.  Continue your other home medications you have been taking for the symptoms.   You have been swabbed for COVID, and the test will result in the next 24 hours. Our staff will call you if positive. If the test is  positive, you should quarantine for 5 days from the start of your symptoms       ED Prescriptions     Medication Sig Dispense Auth. Provider   benzonatate (TESSALON) 100 MG capsule Take 1 capsule (100 mg total) by mouth 3 (three) times daily as needed for cough. 21 capsule Barrett Henle, MD      PDMP not reviewed this encounter.   Barrett Henle, MD 05/05/22 1728

## 2022-05-06 LAB — NOVEL CORONAVIRUS, NAA: SARS-CoV-2, NAA: DETECTED — AB

## 2022-07-24 ENCOUNTER — Ambulatory Visit (INDEPENDENT_AMBULATORY_CARE_PROVIDER_SITE_OTHER): Payer: BC Managed Care – PPO | Admitting: Obstetrics & Gynecology

## 2022-07-24 ENCOUNTER — Encounter (HOSPITAL_BASED_OUTPATIENT_CLINIC_OR_DEPARTMENT_OTHER): Payer: Self-pay | Admitting: Obstetrics & Gynecology

## 2022-07-24 VITALS — BP 133/68 | HR 70 | Ht 62.0 in | Wt 220.8 lb

## 2022-07-24 DIAGNOSIS — Z0000 Encounter for general adult medical examination without abnormal findings: Secondary | ICD-10-CM | POA: Diagnosis not present

## 2022-07-24 DIAGNOSIS — N393 Stress incontinence (female) (male): Secondary | ICD-10-CM

## 2022-07-24 DIAGNOSIS — Z01419 Encounter for gynecological examination (general) (routine) without abnormal findings: Secondary | ICD-10-CM | POA: Diagnosis not present

## 2022-07-24 DIAGNOSIS — Z90710 Acquired absence of both cervix and uterus: Secondary | ICD-10-CM | POA: Diagnosis not present

## 2022-07-24 DIAGNOSIS — B009 Herpesviral infection, unspecified: Secondary | ICD-10-CM

## 2022-07-24 NOTE — Progress Notes (Signed)
46 y.o. G76P0010 Single Black or [place] American female here for annual exam.  Had colonoscopy in July had polyps plus a fibroid polyp.  All of this was benign but having a colonoscopy again in 3 years.    Still sees provider, Dr. Clovis Riley, at [facility] who specializes in NF1.  Denies vaginal bleeding.    Having some issues with urinary incontinence esp with coughing, sneezing, laughing, and at night if wakes up has trouble getting to the bathroom.  Still with same partner.   Patient's last menstrual period was 05/07/2017.          Sexually active: Yes.    The current method of family planning is status post hysterectomy.    Exercising: no Smoker:  no  Health Maintenance: Pap:  neg with neg HR HPV 11/19/2015 prior to hysterectomy History of abnormal Pap:  no MMG:  04/24/22 Colonoscopy:  04/20/22 BMD:   not indicated Screening Labs: 06/2021   reports that she has never smoked. She has never used smokeless tobacco. She reports current alcohol use of about 2.0 - 3.0 standard drinks of alcohol per week. She reports that she does not use drugs.  Past Medical History:  Diagnosis Date   Anemia    Anxiety    slight   Asthma    albuterol rescue inhaler-uses prn, exacerbated by pet dander   History of ectopic pregnancy 1999   S/P RIGHT SALPINGECTOMY   History of herpes simplex type 2 infection 2005   History of uterine fibroid    Neurofibromatosis ([place])    dx is child--  nonmalignant   Pneumonia    years ago   Seasonal allergies     Past Surgical History:  Procedure Laterality Date   CYSTOSCOPY N/A 05/11/2017   Procedure: CYSTOSCOPY;  Surgeon: Megan Salon, MD;  Location: [facility];  Service: Gynecology;  Laterality: N/A;   DILATATION & CURETTAGE/HYSTEROSCOPY WITH MYOSURE N/A 08/07/2016   Procedure: HYSTEROSCOPY WITH MYOSURE;  Surgeon: Megan Salon, MD;  Location: [facility];  Service: Gynecology;  Laterality: N/A;  Fibroid resection   IUD REMOVAL N/A 08/07/2016    Procedure: INTRAUTERINE DEVICE (IUD) REMOVAL;  Surgeon: Megan Salon, MD;  Location: [facility];  Service: Gynecology;  Laterality: N/A;   IUD REMOVAL N/A 05/11/2017   Procedure: INTRAUTERINE DEVICE (IUD) REMOVAL;  Surgeon: Megan Salon, MD;  Location: [facility];  Service: Gynecology;  Laterality: N/A;   LAPAROSCOPIC LYSIS OF ADHESIONS  05/11/2017   Procedure: LAPAROSCOPIC LYSIS OF ADHESIONS;  Surgeon: Megan Salon, MD;  Location: [facility];  Service: Gynecology;;  with cautery of endometroisis   LAPAROSCOPY FOR ECTOPIC PREGNANCY  2000   RIGHT SALPINGECTOMY   ROBOT ASSISTED MYOMECTOMY N/A 12/16/2012   Procedure: ROBOTIC ASSISTED MYOMECTOMY;  Surgeon: Governor Specking, MD;  Location: [facility];  Service: Gynecology;  Laterality: N/A;   ROBOTIC ASSISTED LAPAROSCOPIC LYSIS OF ADHESION  12/16/2012   Procedure: ROBOTIC ASSISTED LAPAROSCOPIC LYSIS OF ADHESION;  Surgeon: Governor Specking, MD;  Location: [facility];  Service: Gynecology;;  with excision of perotoneal lesions   TOTAL LAPAROSCOPIC HYSTERECTOMY WITH SALPINGECTOMY Bilateral 05/11/2017   Procedure: HYSTERECTOMY TOTAL LAPAROSCOPIC WITH LEFT SALPINGECTOMY;  Surgeon: Megan Salon, MD;  Location: [facility];  Service: Gynecology;  Laterality: Bilateral;   WISDOM TOOTH EXTRACTION      Current Outpatient Medications  Medication Sig Dispense Refill   albuterol (PROVENTIL HFA;VENTOLIN HFA) 108 (90 BASE) MCG/ACT inhaler Inhale 2 puffs into the lungs every 6 (six)  hours as needed for wheezing or shortness of breath.      Azelastine-Fluticasone 137-50 MCG/ACT SUSP Place 1 spray into the nose in the morning and at bedtime. 23 g 5   cetirizine (ZYRTEC) 10 MG tablet Take 10 mg by mouth daily.     Vitamin D, Ergocalciferol, (DRISDOL) 1.25 MG (50000 UNIT) CAPS capsule TAKE ONE CAPSULE BY MOUTH EVERY 7 DAYS 12 capsule 3   Olopatadine HCl 0.2 % SOLN Apply 1 drop to eye daily as needed (itchy/watery eyes). (Patient not taking: Reported on 07/24/2022) 2.5 mL  5   No current facility-administered medications for this visit.    Family History  Adopted: Yes  Problem Relation Age of Onset   Colon polyps Mother    Allergies Mother        food   Hypertension Mother    Hyperlipidemia Mother    Heart failure Mother    Thyroid disease Mother    Diabetes Mother    Lung cancer Father    Diabetes Sister    Hypertension Sister    Renal Disease Sister        dialysis   Colon polyps Maternal Aunt    Uterine cancer Paternal Grandmother    Breast cancer Neg Hx    Colon cancer Neg Hx    Esophageal cancer Neg Hx    Stomach cancer Neg Hx    Rectal cancer Neg Hx     ROS: Constitutional: negative Genitourinary:negative  Exam:   BP 133/68   Pulse 70   Ht '5\' 2"'$  (1.575 m)   Wt 220 lb 12.8 oz (100.2 kg)   LMP 05/07/2017   BMI 40.38 kg/m   Height: '5\' 2"'$  (157.5 cm)  General appearance: alert, cooperative and appears stated age Head: Normocephalic, without obvious abnormality, atraumatic Neck: no adenopathy, supple, symmetrical, trachea midline and thyroid normal to inspection and palpation Lungs: clear to auscultation bilaterally Breasts: normal appearance, no masses or tenderness Heart: regular rate and rhythm Abdomen: soft, non-tender; bowel sounds normal; no masses,  no organomegaly Extremities: extremities normal, atraumatic, no cyanosis or edema Skin: Skin color, texture, turgor normal. No rashes or lesions Lymph nodes: Cervical, supraclavicular, and axillary nodes normal. No abnormal inguinal nodes palpated Neurologic: Grossly normal   Pelvic: External genitalia:  no lesions              Urethra:  normal appearing urethra with no masses, tenderness or lesions              Bartholins and Skenes: normal                 Vagina: normal appearing vagina with normal color and no discharge, no lesions              Cervix: absent              Pap taken: No. Bimanual Exam:  Uterus:  uterus absent              Adnexa: no mass, fullness,  tenderness               Rectovaginal: Confirms               Anus:  normal sphincter tone, no lesions  Assessment/Plan: 1. Well woman exam with routine gynecological exam - Pap smear not indicated - Mammogram up to date - Colonoscopy 03/2022 - lab work ordered - vaccines reviewed/updated  2. H/O: hysterectomy  3. HSV-2 (herpes simplex virus 2) infection  4. Stress  incontinence - Ambulatory referral to Physical Therapy  5. Blood tests for routine general physical examination - VITAMIN D 25 Hydroxy (Vit-D Deficiency, Fractures) - CBC - Comprehensive metabolic panel - Hemoglobin A1c - Lipid panel - TSH

## 2022-07-25 LAB — COMPREHENSIVE METABOLIC PANEL
ALT: 16 IU/L (ref 0–32)
AST: 15 IU/L (ref 0–40)
Albumin/Globulin Ratio: 1.7 (ref 1.2–2.2)
Albumin: 4.2 g/dL (ref 3.9–4.9)
Alkaline Phosphatase: 94 IU/L (ref 44–121)
BUN/Creatinine Ratio: 18 (ref 9–23)
BUN: 13 mg/dL (ref 6–24)
Bilirubin Total: 0.7 mg/dL (ref 0.0–1.2)
CO2: 21 mmol/L (ref 20–29)
Calcium: 9 mg/dL (ref 8.7–10.2)
Chloride: 106 mmol/L (ref 96–106)
Creatinine, Ser: 0.71 mg/dL (ref 0.57–1.00)
Globulin, Total: 2.5 g/dL (ref 1.5–4.5)
Glucose: 86 mg/dL (ref 70–99)
Potassium: 4.3 mmol/L (ref 3.5–5.2)
Sodium: 139 mmol/L (ref 134–144)
Total Protein: 6.7 g/dL (ref 6.0–8.5)
eGFR: 107 mL/min/{1.73_m2} (ref >59)

## 2022-07-25 LAB — CBC
Hematocrit: 37.2 % (ref 34.0–46.6)
Hemoglobin: 12.8 g/dL (ref 11.1–15.9)
MCH: 30.6 pg (ref 26.6–33.0)
MCHC: 34.4 g/dL (ref 31.5–35.7)
MCV: 89 fL (ref 79–97)
Platelets: 190 10*3/uL (ref 150–450)
RBC: 4.18 x10E6/uL (ref 3.77–5.28)
RDW: 11.3 % — ABNORMAL LOW (ref 11.7–15.4)
WBC: 4.1 10*3/uL (ref 3.4–10.8)

## 2022-07-25 LAB — HEMOGLOBIN A1C
Est. average glucose Bld gHb Est-mCnc: 100 mg/dL
Hgb A1c MFr Bld: 5.1 % (ref 4.8–5.6)

## 2022-07-25 LAB — LIPID PANEL
Chol/HDL Ratio: 1.9 ratio (ref 0.0–4.4)
Cholesterol, Total: 148 mg/dL (ref 100–199)
HDL: 76 mg/dL (ref >39)
LDL Chol Calc (NIH): 62 mg/dL (ref 0–99)
Triglycerides: 42 mg/dL (ref 0–149)
VLDL Cholesterol Cal: 10 mg/dL (ref 5–40)

## 2022-07-25 LAB — TSH: TSH: 1.35 u[IU]/mL (ref 0.450–4.500)

## 2022-07-25 LAB — VITAMIN D 25 HYDROXY (VIT D DEFICIENCY, FRACTURES): Vit D, 25-Hydroxy: 25.8 ng/mL — ABNORMAL LOW (ref 30.0–100.0)

## 2022-07-30 ENCOUNTER — Telehealth: Payer: Self-pay

## 2022-07-30 NOTE — Patient Outreach (Signed)
  Care Coordination   07/30/2022 Name: Lacey Lee MRN: [id number] DOB: 1976/07/27   Care Coordination Outreach Attempts:  An unsuccessful telephone outreach was attempted today to offer the patient information about available care coordination services as a benefit of their health plan.   Follow Up Plan:  Additional outreach attempts will be made to offer the patient care coordination information and services.   Encounter Outcome:  No Answer  Care Coordination Interventions Activated:  No   Care Coordination Interventions:  No, not indicated    Peter Garter RN, BSN,CCM, [place] [facility] [phone]

## 2022-08-10 ENCOUNTER — Telehealth: Payer: Self-pay

## 2022-08-10 NOTE — Patient Outreach (Signed)
  Care Coordination   Initial Visit Note   08/10/2022 Name: Lacey Lee MRN: [id number] DOB: 10/16/1975  Lacey Lee is a 46 y.o. year old female who sees [place], [name], MD for primary care. I spoke with  Lacey Lee by phone today.  What matters to the patients health and wellness today?  No concerns today    Goals Addressed             This Visit's Progress    COMPLETED: Care Coordination Activities - no follow up required       Care Coordination Interventions: Provided education to patient re: care coordination services Assessed social determinant of health barriers          SDOH assessments and interventions completed:  Yes  SDOH Interventions Today    Flowsheet Row Most Recent Value  SDOH Interventions   Food Insecurity Interventions Intervention Not Indicated  Housing Interventions Intervention Not Indicated  Utilities Interventions Intervention Not Indicated        Care Coordination Interventions Activated:  Yes  Care Coordination Interventions:  Yes, provided   Follow up plan: No further intervention required.   Encounter Outcome:  Pt. Visit Completed  Peter Garter RN, BSN,CCM, CDE Care Management Coordinator [place] [facility] [phone]

## 2022-08-10 NOTE — Patient Outreach (Signed)
  Care Coordination   08/10/2022 Name: Lacey Lee MRN: [id number] DOB: 03/18/1976   Care Coordination Outreach Attempts:  A second unsuccessful outreach was attempted today to offer the patient with information about available care coordination services as a benefit of their health plan.     Follow Up Plan:  Additional outreach attempts will be made to offer the patient care coordination information and services.   Encounter Outcome:  No Answer  Care Coordination Interventions Activated:  No   Care Coordination Interventions:  No, not indicated    Peter Garter RN, BSN,CCM, [place] [facility] [phone]

## 2022-08-10 NOTE — Patient Instructions (Signed)
Visit Information  Thank you for taking time to visit with me today. Please don't hesitate to contact me if I can be of assistance to you.   Following are the goals we discussed today:   Goals Addressed             This Visit's Progress    COMPLETED: Care Coordination Activities - no follow up required       Care Coordination Interventions: Provided education to patient re: care coordination services Assessed social determinant of health barriers          If you are experiencing a Mental Health or [place] or need someone to talk to, please call the Suicide and Crisis Lifeline: 988 call the [place] National Suicide Prevention Lifeline: 219-462-4664 or TTY: 712 393 3242 TTY 930 257 0288) to talk to a trained counselor call 1-800-273-TALK (toll free, 24 hour hotline) go to [facility] [place] [phone]) call 911   The patient verbalized understanding of instructions, educational materials, and care plan provided today and DECLINED offer to receive copy of patient instructions, educational materials, and care plan.   No further follow up required:    Peter Garter RN, Jackquline Denmark, [place] [facility] [phone]

## 2022-08-17 ENCOUNTER — Ambulatory Visit: Payer: BC Managed Care – PPO | Attending: Obstetrics & Gynecology | Admitting: Physical Therapy

## 2022-08-17 ENCOUNTER — Other Ambulatory Visit: Payer: Self-pay

## 2022-08-17 DIAGNOSIS — N393 Stress incontinence (female) (male): Secondary | ICD-10-CM | POA: Diagnosis not present

## 2022-08-17 DIAGNOSIS — R293 Abnormal posture: Secondary | ICD-10-CM | POA: Diagnosis not present

## 2022-08-17 DIAGNOSIS — M6281 Muscle weakness (generalized): Secondary | ICD-10-CM | POA: Insufficient documentation

## 2022-08-17 DIAGNOSIS — R279 Unspecified lack of coordination: Secondary | ICD-10-CM | POA: Diagnosis not present

## 2022-08-17 NOTE — Patient Instructions (Signed)

## 2022-08-17 NOTE — Therapy (Signed)
OUTPATIENT PHYSICAL THERAPY FEMALE PELVIC EVALUATION   Patient Name: Lacey Lee MRN: [id number] DOB:1975/10/02, 46 y.o., female Today's Date: 08/17/2022  END OF SESSION:  PT End of Session - 08/17/22 1618     Visit Number 1    Date for PT Re-Evaluation 11/17/22    Authorization Type BCBS    PT Start Time 1618   pt arrival time   PT Stop Time 1656    PT Time Calculation (min) 38 min    Activity Tolerance Patient tolerated treatment well    Behavior During Therapy WFL for tasks assessed/performed             Past Medical History:  Diagnosis Date   Anemia    Anxiety    slight   Asthma    albuterol rescue inhaler-uses prn, exacerbated by pet dander   History of ectopic pregnancy 1999   S/P RIGHT SALPINGECTOMY   History of herpes simplex type 2 infection 2005   History of uterine fibroid    Neurofibromatosis ([place])    dx is child--  nonmalignant   Pneumonia    years ago   Seasonal allergies    Past Surgical History:  Procedure Laterality Date   CYSTOSCOPY N/A 05/11/2017   Procedure: CYSTOSCOPY;  Surgeon: Megan Salon, MD;  Location: [facility];  Service: Gynecology;  Laterality: N/A;   DILATATION & CURETTAGE/HYSTEROSCOPY WITH MYOSURE N/A 08/07/2016   Procedure: HYSTEROSCOPY WITH MYOSURE;  Surgeon: Megan Salon, MD;  Location: [facility];  Service: Gynecology;  Laterality: N/A;  Fibroid resection   IUD REMOVAL N/A 08/07/2016   Procedure: INTRAUTERINE DEVICE (IUD) REMOVAL;  Surgeon: Megan Salon, MD;  Location: [facility];  Service: Gynecology;  Laterality: N/A;   IUD REMOVAL N/A 05/11/2017   Procedure: INTRAUTERINE DEVICE (IUD) REMOVAL;  Surgeon: Megan Salon, MD;  Location: [facility];  Service: Gynecology;  Laterality: N/A;   LAPAROSCOPIC LYSIS OF ADHESIONS  05/11/2017   Procedure: LAPAROSCOPIC LYSIS OF ADHESIONS;  Surgeon: Megan Salon, MD;  Location: [facility];  Service: Gynecology;;  with cautery of endometroisis   LAPAROSCOPY  FOR ECTOPIC PREGNANCY  2000   RIGHT SALPINGECTOMY   ROBOT ASSISTED MYOMECTOMY N/A 12/16/2012   Procedure: ROBOTIC ASSISTED MYOMECTOMY;  Surgeon: Governor Specking, MD;  Location: [facility];  Service: Gynecology;  Laterality: N/A;   ROBOTIC ASSISTED LAPAROSCOPIC LYSIS OF ADHESION  12/16/2012   Procedure: ROBOTIC ASSISTED LAPAROSCOPIC LYSIS OF ADHESION;  Surgeon: Governor Specking, MD;  Location: [facility];  Service: Gynecology;;  with excision of perotoneal lesions   TOTAL LAPAROSCOPIC HYSTERECTOMY WITH SALPINGECTOMY Bilateral 05/11/2017   Procedure: HYSTERECTOMY TOTAL LAPAROSCOPIC WITH LEFT SALPINGECTOMY;  Surgeon: Megan Salon, MD;  Location: [facility];  Service: Gynecology;  Laterality: Bilateral;   WISDOM TOOTH EXTRACTION     Patient Active Problem List   Diagnosis Date Noted   H/O: hysterectomy 07/03/2021   Allergy to alpha-gal 04/21/2020   Allergic conjunctivitis of both eyes 04/21/2020   Lumbar radiculopathy, right 06/13/2019   Patellofemoral arthritis of right knee 02/27/2019   IDA (iron deficiency anemia) 10/28/2018   Cervical stenosis of spinal canal 02/07/2018   Neurofibromatosis ([place]) 02/04/2017   Greater trochanteric bursitis of left hip 08/31/2016   Other allergic rhinitis 05/29/2012   Mild intermittent reactive airway disease 05/29/2012    PCP: Alroy Dust, [name], MD  REFERRING PROVIDER: Megan Salon, MD  REFERRING DIAG: N39.3 (ICD-10-CM) - Stress incontinence  THERAPY DIAG:  Muscle weakness (generalized)  Abnormal posture  Unspecified lack of coordination  Rationale for Evaluation and Treatment: Rehabilitation  ONSET DATE: worse in the past year  SUBJECTIVE:                                                                                                                                                                                           SUBJECTIVE STATEMENT: Pt reports she has urinary leakage with urge and transferring to standing, unable to stop it. Does have  leakage with minimally with sneezing and coughing and exercising and sometimes after urinating will have dripping.   Fluid intake: Yes: water - "constantly but I don't know how much", about 4 cups of coffee each day    PAIN:  Are you having pain? No   PRECAUTIONS: None  WEIGHT BEARING RESTRICTIONS: No  FALLS:  Has patient fallen in last 6 months? No  LIVING ENVIRONMENT: Lives with: lives alone Lives in: House/apartment   OCCUPATION: lab   PLOF: Independent  PATIENT GOALS: to have less leakage  PERTINENT HISTORY:  Hysterectomy, anxiety, herpes type 2, uterine fibroid, neurofibromatosis, CYSTOSCOPY,  Sexual abuse: No  BOWEL MOVEMENT: Pain with bowel movement: No Type of bowel movement:Type (Bristol Stool Scale) 4, Frequency 3x per week, and Strain No Fully empty rectum: Yes:   Leakage: No Pads: No Fiber supplement: No  URINATION: Pain with urination: No Fully empty bladder: Yes:   Stream: Strong Urgency: Yes:   Frequency: about every 30 mins with drinking water more frequently not every night but sometimes 1x.  Leakage: Urge to void, Walking to the bathroom, Coughing, Sneezing, and Exercise Pads: Yes: wears pads 1x per day  INTERCOURSE:  Pain with intercourse:  no pain Ability to have vaginal penetration:  Yes:   Climax: not painful Marinoff Scale: 0/3  PREGNANCY: Vaginal deliveries 0 Tearing No C-section deliveries 0 Currently pregnant No  PROLAPSE: None   OBJECTIVE:   DIAGNOSTIC FINDINGS:    COGNITION: Overall cognitive status: Within functional limits for tasks assessed     SENSATION: Light touch: Appears intact Proprioception: Appears intact  MUSCLE LENGTH: Bil hamstrings and adductors limited by 25%   POSTURE: rounded shoulders, forward head, and posterior pelvic tilt   LUMBARAROM/PROM:  A/PROM A/PROM  eval  Flexion Limited by 25%  Extension WFL  Right lateral flexion Limited by 25%  Left lateral flexion Limited by 25%   Right rotation Limited by 25%  Left rotation Limited by 25%   (Blank rows = not tested)  LOWER EXTREMITY ROM:  WFL  LOWER EXTREMITY MMT:  Bil hips grossly 4-/5, knees and ankles 5/5  PALPATION:   General  mild TTP at Lt anterior  hip and over bladder with fascial restrictions here                External Perineal Exam [facility]                             Internal Pelvic Floor [facility]  Patient confirms identification and approves PT to assess internal pelvic floor and treatment Yes  PELVIC MMT:   MMT eval  Vaginal 2/5, 2s, 5 reps  Internal Anal Sphincter   External Anal Sphincter   Puborectalis   Diastasis Recti   (Blank rows = not tested)        TONE: WFL  PROLAPSE: Not seen in hooklying   TODAY'S TREATMENT:                                                                                                                               08/17/22 EVAL Examination completed, findings reviewed, pt educated on POC,  urge drill. Pt motivated to participate in PT and agreeable to attempt recommendations.     PATIENT EDUCATION:  Education details: urge drill  Person educated: Patient Education method: Explanation, Demonstration, Tactile cues, Verbal cues, and Handouts Education comprehension: verbalized understanding and returned demonstration  HOME EXERCISE PROGRAM: Urge drill   ASSESSMENT:  CLINICAL IMPRESSION: Patient is a 46 y.o. female   who was seen today for physical therapy evaluation and treatment for urinary incontinence. Pt reports worsening leakage over the past year and now unable to stop urine once leakage starts, usually with stressor and has strong urgency where when she stands up she has leakage. Pt found to have decreased flexibility at spine and hips, decreased hip strength and core strength, fascial restrictions in Lt lower abdomen and over bladder area, mild TTP at Lt lower quadrant. Pt consented to internal vaginal assessment this date and found to have  decreased strength, coordination, and endurance and benefited from moderate cues for improved technique with pelvic floor contraction and pt reports she has poor ability to feel when she is contracting and only can intermittently. Pt would benefit from additional PT to further address deficits.    OBJECTIVE IMPAIRMENTS: decreased coordination, decreased endurance, decreased mobility, decreased strength, increased fascial restrictions, impaired flexibility, improper body mechanics, and postural dysfunction.   ACTIVITY LIMITATIONS: carrying, squatting, transfers, continence, and locomotion level  PARTICIPATION LIMITATIONS: community activity and yard work  PERSONAL FACTORS: Fitness, Time since onset of injury/illness/exacerbation, and 1 comorbidity: medical history  are also affecting patient's functional outcome.   REHAB POTENTIAL: Good  CLINICAL DECISION MAKING: Stable/uncomplicated  EVALUATION COMPLEXITY: Low   GOALS: Goals reviewed with patient? Yes  SHORT TERM GOALS: Target date: 09/14/22  Pt to be I with HEP.  Baseline: Goal status: INITIAL  2.  Pt will have 25% less urgency due to bladder retraining and strengthening  Baseline:  Goal status: INITIAL  3.  Pt to report improved time between  bladder voids to at least 2 hours for improved QOL with decreased urinary frequency.   Baseline:  Goal status: INITIAL  4.  Pt to demonstrate at least 3/5 pelvic floor strength for improved pelvic stability and decreased strain at pelvic floor/ decrease leakage.  Baseline:  Goal status: INITIAL   LONG TERM GOALS: Target date: 11/17/22  Pt to be I with advanced HEP.  Baseline:  Goal status: INITIAL  2.  Pt to demonstrate at least 5/5 bil hip strength for improved pelvic stability and functional squats without leakage.  Baseline:  Goal status: INITIAL  3.  Pt will have 50% less urgency due to bladder retraining and strengthening  Baseline:  Goal status: INITIAL  4.  Pt to  report improved time between bladder voids to at least 3 hours for improved QOL with decreased urinary frequency.   Baseline:  Goal status: INITIAL  5.  Pt to demonstrate at least 4/5 pelvic floor strength for improved pelvic stability and decreased strain at pelvic floor/ decrease leakage.  Baseline:  Goal status: INITIAL  6.  Pt to demonstrate improved coordination of pelvic floor and breathing mechanics with squatting 15# for decreased leakage.  Baseline:  Goal status: INITIAL  PLAN:  PT FREQUENCY: 1x/week  PT DURATION:  8 sessions  PLANNED INTERVENTIONS: Therapeutic exercises, Therapeutic activity, Neuromuscular re-education, Patient/Family education, Self Care, Joint mobilization, Aquatic Therapy, Dry Needling, Electrical stimulation, Spinal mobilization, Cryotherapy, Moist heat, scar mobilization, Taping, Biofeedback, and Manual therapy  PLAN FOR NEXT SESSION: internal NMRE if needed and pt consents, breathing and pelvic floor coordination training, hip/core strength  Stacy Gardner, PT, DPT 11/20/235:02 PM

## 2022-08-31 DIAGNOSIS — Q8501 Neurofibromatosis, type 1: Secondary | ICD-10-CM | POA: Diagnosis not present

## 2022-09-10 ENCOUNTER — Ambulatory Visit: Payer: BC Managed Care – PPO | Attending: Obstetrics & Gynecology | Admitting: Physical Therapy

## 2022-09-10 DIAGNOSIS — R293 Abnormal posture: Secondary | ICD-10-CM

## 2022-09-10 DIAGNOSIS — M6281 Muscle weakness (generalized): Secondary | ICD-10-CM | POA: Diagnosis not present

## 2022-09-10 DIAGNOSIS — R279 Unspecified lack of coordination: Secondary | ICD-10-CM

## 2022-09-10 NOTE — Patient Instructions (Signed)

## 2022-09-10 NOTE — Therapy (Signed)
OUTPATIENT PHYSICAL THERAPY FEMALE PELVIC TREATMENT   Patient Name: Lacey Lee MRN: [id number] DOB:[birth year], 46 y.o., female Today's Date: 09/10/2022  END OF SESSION:  PT End of Session - 09/10/22 0934     Visit Number 2    Date for PT Re-Evaluation 11/17/22    Authorization Type BCBS    PT Start Time [phone]   pt arrival time   PT Stop Time 1010    PT Time Calculation (min) 36 min    Activity Tolerance Patient tolerated treatment well    Behavior During Therapy WFL for tasks assessed/performed             Past Medical History:  Diagnosis Date   Anemia    Anxiety    slight   Asthma    albuterol rescue inhaler-uses prn, exacerbated by pet dander   History of ectopic pregnancy 1999   S/P RIGHT SALPINGECTOMY   History of herpes simplex type 2 infection 2005   History of uterine fibroid    Neurofibromatosis ([place])    dx is child--  nonmalignant   Pneumonia    years ago   Seasonal allergies    Past Surgical History:  Procedure Laterality Date   CYSTOSCOPY N/A 05/11/2017   Procedure: CYSTOSCOPY;  Surgeon: Megan Salon, MD;  Location: [facility];  Service: Gynecology;  Laterality: N/A;   DILATATION & CURETTAGE/HYSTEROSCOPY WITH MYOSURE N/A 08/07/2016   Procedure: HYSTEROSCOPY WITH MYOSURE;  Surgeon: Megan Salon, MD;  Location: [facility];  Service: Gynecology;  Laterality: N/A;  Fibroid resection   IUD REMOVAL N/A 08/07/2016   Procedure: INTRAUTERINE DEVICE (IUD) REMOVAL;  Surgeon: Megan Salon, MD;  Location: [facility];  Service: Gynecology;  Laterality: N/A;   IUD REMOVAL N/A 05/11/2017   Procedure: INTRAUTERINE DEVICE (IUD) REMOVAL;  Surgeon: Megan Salon, MD;  Location: [facility];  Service: Gynecology;  Laterality: N/A;   LAPAROSCOPIC LYSIS OF ADHESIONS  05/11/2017   Procedure: LAPAROSCOPIC LYSIS OF ADHESIONS;  Surgeon: Megan Salon, MD;  Location: [facility];  Service: Gynecology;;  with cautery of endometroisis   LAPAROSCOPY  FOR ECTOPIC PREGNANCY  2000   RIGHT SALPINGECTOMY   ROBOT ASSISTED MYOMECTOMY N/A 12/16/2012   Procedure: ROBOTIC ASSISTED MYOMECTOMY;  Surgeon: Governor Specking, MD;  Location: [facility];  Service: Gynecology;  Laterality: N/A;   ROBOTIC ASSISTED LAPAROSCOPIC LYSIS OF ADHESION  12/16/2012   Procedure: ROBOTIC ASSISTED LAPAROSCOPIC LYSIS OF ADHESION;  Surgeon: Governor Specking, MD;  Location: [facility];  Service: Gynecology;;  with excision of perotoneal lesions   TOTAL LAPAROSCOPIC HYSTERECTOMY WITH SALPINGECTOMY Bilateral 05/11/2017   Procedure: HYSTERECTOMY TOTAL LAPAROSCOPIC WITH LEFT SALPINGECTOMY;  Surgeon: Megan Salon, MD;  Location: [facility];  Service: Gynecology;  Laterality: Bilateral;   WISDOM TOOTH EXTRACTION     Patient Active Problem List   Diagnosis Date Noted   H/O: hysterectomy 07/03/2021   Allergy to alpha-gal 04/21/2020   Allergic conjunctivitis of both eyes 04/21/2020   Lumbar radiculopathy, right 06/13/2019   Patellofemoral arthritis of right knee 02/27/2019   IDA (iron deficiency anemia) 10/28/2018   Cervical stenosis of spinal canal 02/07/2018   Neurofibromatosis ([place]) 02/04/2017   Greater trochanteric bursitis of left hip 08/31/2016   Other allergic rhinitis 05/29/2012   Mild intermittent reactive airway disease 05/29/2012    PCP: Alroy Dust, [name], MD  REFERRING PROVIDER: Megan Salon, MD  REFERRING DIAG: N39.3 (ICD-10-CM) - Stress incontinence  THERAPY DIAG:  Muscle weakness (generalized)  Abnormal posture  Unspecified lack of coordination  Rationale for Evaluation and Treatment: Rehabilitation  ONSET DATE: worse in the past year  SUBJECTIVE:                                                                                                                                                                                           SUBJECTIVE STATEMENT: Pt reports she has been doing urge drill and it has helped 2 times but not consistent yet. Everything else  is the same.   Fluid intake: Yes: water - "constantly but I don't know how much", about 4 cups of coffee each day    PAIN:  Are you having pain? No   PRECAUTIONS: None  WEIGHT BEARING RESTRICTIONS: No  FALLS:  Has patient fallen in last 6 months? No  LIVING ENVIRONMENT: Lives with: lives alone Lives in: House/apartment   OCCUPATION: lab   PLOF: Independent  PATIENT GOALS: to have less leakage  PERTINENT HISTORY:  Hysterectomy, anxiety, herpes type 2, uterine fibroid, neurofibromatosis, CYSTOSCOPY,  Sexual abuse: No  BOWEL MOVEMENT: Pain with bowel movement: No Type of bowel movement:Type (Bristol Stool Scale) 4, Frequency 3x per week, and Strain No Fully empty rectum: Yes:   Leakage: No Pads: No Fiber supplement: No  URINATION: Pain with urination: No Fully empty bladder: Yes:   Stream: Strong Urgency: Yes:   Frequency: about every 30 mins with drinking water more frequently not every night but sometimes 1x.  Leakage: Urge to void, Walking to the bathroom, Coughing, Sneezing, and Exercise Pads: Yes: wears pads 1x per day  INTERCOURSE:  Pain with intercourse:  no pain Ability to have vaginal penetration:  Yes:   Climax: not painful Marinoff Scale: 0/3  PREGNANCY: Vaginal deliveries 0 Tearing No C-section deliveries 0 Currently pregnant No  PROLAPSE: None   OBJECTIVE:   DIAGNOSTIC FINDINGS:    COGNITION: Overall cognitive status: Within functional limits for tasks assessed     SENSATION: Light touch: Appears intact Proprioception: Appears intact  MUSCLE LENGTH: Bil hamstrings and adductors limited by 25%   POSTURE: rounded shoulders, forward head, and posterior pelvic tilt   LUMBARAROM/PROM:  A/PROM A/PROM  eval  Flexion Limited by 25%  Extension WFL  Right lateral flexion Limited by 25%  Left lateral flexion Limited by 25%  Right rotation Limited by 25%  Left rotation Limited by 25%   (Blank rows = not tested)  LOWER  EXTREMITY ROM:  WFL  LOWER EXTREMITY MMT:  Bil hips grossly 4-/5, knees and ankles 5/5  PALPATION:   General  mild TTP at Lt anterior hip and over bladder with fascial restrictions here  External Perineal Exam [facility]                             Internal Pelvic Floor [facility]  Patient confirms identification and approves PT to assess internal pelvic floor and treatment Yes  PELVIC MMT:   MMT eval  Vaginal 2/5, 2s, 5 reps  Internal Anal Sphincter   External Anal Sphincter   Puborectalis   Diastasis Recti   (Blank rows = not tested)        TONE: WFL  PROLAPSE: Not seen in hooklying   TODAY'S TREATMENT:                                                                                                                               09/10/22: NMRE: all exercises cued for breathing mechanics and pelvic floor coordination to decrease leakage and improve pelvic floor strength 2x10 bridges with ball squeezes  Opp hand/knee ball press 2x10 Hooklying bil elbow ext green band 2x10 Bird dog 2x10 each  2x10 Sit to stand from mat table body weights  Seated pelvic floor contractions 2x10 with towel roll, isometrics with towel rolls 3-5s x10, and O87 quick flicks, O67 mini coughs  Pt given bladder irritants and reviewed   PATIENT EDUCATION:  Education details: urge drill, HEP, bladder irritants   Person educated: Patient Education method: Explanation, Demonstration, Tactile cues, Verbal cues, and Handouts Education comprehension: verbalized understanding and returned demonstration  HOME EXERCISE PROGRAM: Urge drill,  [id number]  ASSESSMENT:  CLINICAL IMPRESSION: Patient session focused on education for bladder irritants and given HEP. Pt  Pt would benefit from additional PT to further address deficits.    OBJECTIVE IMPAIRMENTS: decreased coordination, decreased endurance, decreased mobility, decreased strength, increased fascial restrictions, impaired flexibility, improper  body mechanics, and postural dysfunction.   ACTIVITY LIMITATIONS: carrying, squatting, transfers, continence, and locomotion level  PARTICIPATION LIMITATIONS: community activity and yard work  PERSONAL FACTORS: Fitness, Time since onset of injury/illness/exacerbation, and 1 comorbidity: medical history  are also affecting patient's functional outcome.   REHAB POTENTIAL: Good  CLINICAL DECISION MAKING: Stable/uncomplicated  EVALUATION COMPLEXITY: Low   GOALS: Goals reviewed with patient? Yes  SHORT TERM GOALS: Target date: 09/14/22  Pt to be I with HEP.  Baseline: Goal status: INITIAL  2.  Pt will have 25% less urgency due to bladder retraining and strengthening  Baseline:  Goal status: INITIAL  3.  Pt to report improved time between bladder voids to at least 2 hours for improved QOL with decreased urinary frequency.   Baseline:  Goal status: INITIAL  4.  Pt to demonstrate at least 3/5 pelvic floor strength for improved pelvic stability and decreased strain at pelvic floor/ decrease leakage.  Baseline:  Goal status: INITIAL   LONG TERM GOALS: Target date: 11/17/22  Pt to be I with advanced HEP.  Baseline:  Goal status: INITIAL  2.  Pt to demonstrate at least  5/5 bil hip strength for improved pelvic stability and functional squats without leakage.  Baseline:  Goal status: INITIAL  3.  Pt will have 50% less urgency due to bladder retraining and strengthening  Baseline:  Goal status: INITIAL  4.  Pt to report improved time between bladder voids to at least 3 hours for improved QOL with decreased urinary frequency.   Baseline:  Goal status: INITIAL  5.  Pt to demonstrate at least 4/5 pelvic floor strength for improved pelvic stability and decreased strain at pelvic floor/ decrease leakage.  Baseline:  Goal status: INITIAL  6.  Pt to demonstrate improved coordination of pelvic floor and breathing mechanics with squatting 15# for decreased leakage.  Baseline:   Goal status: INITIAL  PLAN:  PT FREQUENCY: 1x/week  PT DURATION:  8 sessions  PLANNED INTERVENTIONS: Therapeutic exercises, Therapeutic activity, Neuromuscular re-education, Patient/Family education, Self Care, Joint mobilization, Aquatic Therapy, Dry Needling, Electrical stimulation, Spinal mobilization, Cryotherapy, Moist heat, scar mobilization, Taping, Biofeedback, and Manual therapy  PLAN FOR NEXT SESSION: internal NMRE if needed and pt consents, breathing and pelvic floor coordination training, hip/core strength  Stacy Gardner, PT, DPT 09/10/2309:14 AM

## 2022-09-15 ENCOUNTER — Ambulatory Visit: Payer: BC Managed Care – PPO | Admitting: Physical Therapy

## 2022-09-22 ENCOUNTER — Ambulatory Visit: Payer: BC Managed Care – PPO | Admitting: Physical Therapy

## 2022-09-22 DIAGNOSIS — R293 Abnormal posture: Secondary | ICD-10-CM | POA: Diagnosis not present

## 2022-09-22 DIAGNOSIS — R279 Unspecified lack of coordination: Secondary | ICD-10-CM | POA: Diagnosis not present

## 2022-09-22 DIAGNOSIS — M6281 Muscle weakness (generalized): Secondary | ICD-10-CM

## 2022-09-22 NOTE — Therapy (Signed)
OUTPATIENT PHYSICAL THERAPY FEMALE PELVIC TREATMENT   Patient Name: Lacey Lee MRN: [id number] DOB:26-Dec-1975, 46 y.o., female Today's Date: 09/22/2022  END OF SESSION:  PT End of Session - 09/22/22 0936     Visit Number 3    Date for PT Re-Evaluation 11/17/22    Authorization Type BCBS    PT Start Time 0935   arrival time   PT Stop Time 1013    PT Time Calculation (min) 38 min    Activity Tolerance Patient tolerated treatment well    Behavior During Therapy WFL for tasks assessed/performed             Past Medical History:  Diagnosis Date   Anemia    Anxiety    slight   Asthma    albuterol rescue inhaler-uses prn, exacerbated by pet dander   History of ectopic pregnancy 1999   S/P RIGHT SALPINGECTOMY   History of herpes simplex type 2 infection 2005   History of uterine fibroid    Neurofibromatosis ([place])    dx is child--  nonmalignant   Pneumonia    years ago   Seasonal allergies    Past Surgical History:  Procedure Laterality Date   CYSTOSCOPY N/A 05/11/2017   Procedure: CYSTOSCOPY;  Surgeon: Megan Salon, MD;  Location: [facility];  Service: Gynecology;  Laterality: N/A;   DILATATION & CURETTAGE/HYSTEROSCOPY WITH MYOSURE N/A 08/07/2016   Procedure: HYSTEROSCOPY WITH MYOSURE;  Surgeon: Megan Salon, MD;  Location: [facility];  Service: Gynecology;  Laterality: N/A;  Fibroid resection   IUD REMOVAL N/A 08/07/2016   Procedure: INTRAUTERINE DEVICE (IUD) REMOVAL;  Surgeon: Megan Salon, MD;  Location: [facility];  Service: Gynecology;  Laterality: N/A;   IUD REMOVAL N/A 05/11/2017   Procedure: INTRAUTERINE DEVICE (IUD) REMOVAL;  Surgeon: Megan Salon, MD;  Location: [facility];  Service: Gynecology;  Laterality: N/A;   LAPAROSCOPIC LYSIS OF ADHESIONS  05/11/2017   Procedure: LAPAROSCOPIC LYSIS OF ADHESIONS;  Surgeon: Megan Salon, MD;  Location: [facility];  Service: Gynecology;;  with cautery of endometroisis   LAPAROSCOPY FOR  ECTOPIC PREGNANCY  2000   RIGHT SALPINGECTOMY   ROBOT ASSISTED MYOMECTOMY N/A 12/16/2012   Procedure: ROBOTIC ASSISTED MYOMECTOMY;  Surgeon: Governor Specking, MD;  Location: [facility];  Service: Gynecology;  Laterality: N/A;   ROBOTIC ASSISTED LAPAROSCOPIC LYSIS OF ADHESION  12/16/2012   Procedure: ROBOTIC ASSISTED LAPAROSCOPIC LYSIS OF ADHESION;  Surgeon: Governor Specking, MD;  Location: [facility];  Service: Gynecology;;  with excision of perotoneal lesions   TOTAL LAPAROSCOPIC HYSTERECTOMY WITH SALPINGECTOMY Bilateral 05/11/2017   Procedure: HYSTERECTOMY TOTAL LAPAROSCOPIC WITH LEFT SALPINGECTOMY;  Surgeon: Megan Salon, MD;  Location: [facility];  Service: Gynecology;  Laterality: Bilateral;   WISDOM TOOTH EXTRACTION     Patient Active Problem List   Diagnosis Date Noted   H/O: hysterectomy 07/03/2021   Allergy to alpha-gal 04/21/2020   Allergic conjunctivitis of both eyes 04/21/2020   Lumbar radiculopathy, right 06/13/2019   Patellofemoral arthritis of right knee 02/27/2019   IDA (iron deficiency anemia) 10/28/2018   Cervical stenosis of spinal canal 02/07/2018   Neurofibromatosis ([place]) 02/04/2017   Greater trochanteric bursitis of left hip 08/31/2016   Other allergic rhinitis 05/29/2012   Mild intermittent reactive airway disease 05/29/2012    PCP: Alroy Dust, [name], MD  REFERRING PROVIDER: Megan Salon, MD  REFERRING DIAG: N39.3 (ICD-10-CM) - Stress incontinence  THERAPY DIAG:  Muscle weakness (generalized)  Abnormal posture  Unspecified  lack of coordination  Rationale for Evaluation and Treatment: Rehabilitation  ONSET DATE: worse in the past year  SUBJECTIVE:                                                                                                                                                                                           SUBJECTIVE STATEMENT: Pt reports she still feels about the same with symptoms. Pt does report frequency has improved slightly and no  longer going every 30 mins   Fluid intake: Yes: water - "constantly but I don't know how much", about 4 cups of coffee each day    PAIN:  Are you having pain? No   PRECAUTIONS: None  WEIGHT BEARING RESTRICTIONS: No  FALLS:  Has patient fallen in last 6 months? No  LIVING ENVIRONMENT: Lives with: lives alone Lives in: House/apartment   OCCUPATION: lab   PLOF: Independent  PATIENT GOALS: to have less leakage  PERTINENT HISTORY:  Hysterectomy, anxiety, herpes type 2, uterine fibroid, neurofibromatosis, CYSTOSCOPY,  Sexual abuse: No  BOWEL MOVEMENT: Pain with bowel movement: No Type of bowel movement:Type (Bristol Stool Scale) 4, Frequency 3x per week, and Strain No Fully empty rectum: Yes:   Leakage: No Pads: No Fiber supplement: No  URINATION: Pain with urination: No Fully empty bladder: Yes:   Stream: Strong Urgency: Yes:   Frequency: about every 30 mins with drinking water more frequently not every night but sometimes 1x.  Leakage: Urge to void, Walking to the bathroom, Coughing, Sneezing, and Exercise Pads: Yes: wears pads 1x per day  INTERCOURSE: Pain with intercourse:  no pain Ability to have vaginal penetration:  Yes:   Climax: not painful Marinoff Scale: 0/3  PREGNANCY: Vaginal deliveries 0 Tearing No C-section deliveries 0 Currently pregnant No  PROLAPSE: None   OBJECTIVE:   DIAGNOSTIC FINDINGS:    COGNITION: Overall cognitive status: Within functional limits for tasks assessed     SENSATION: Light touch: Appears intact Proprioception: Appears intact  MUSCLE LENGTH: Bil hamstrings and adductors limited by 25%   POSTURE: rounded shoulders, forward head, and posterior pelvic tilt   LUMBARAROM/PROM:  A/PROM A/PROM  eval  Flexion Limited by 25%  Extension WFL  Right lateral flexion Limited by 25%  Left lateral flexion Limited by 25%  Right rotation Limited by 25%  Left rotation Limited by 25%   (Blank rows = not  tested)  LOWER EXTREMITY ROM:  WFL  LOWER EXTREMITY MMT:  Bil hips grossly 4-/5, knees and ankles 5/5  PALPATION:   General  mild TTP at Lt anterior hip and over bladder with fascial restrictions here  External Perineal Exam [facility]                             Internal Pelvic Floor [facility]  Patient confirms identification and approves PT to assess internal pelvic floor and treatment Yes  PELVIC MMT:   MMT eval 09/22/22   Vaginal 2/5, 2s, 5 reps 3/5, 5s, 7 reps  Internal Anal Sphincter    External Anal Sphincter    Puborectalis    Diastasis Recti    (Blank rows = not tested)        TONE: WFL  PROLAPSE: Not seen in hooklying   TODAY'S TREATMENT:                                                                                                                               09/22/22: NMRE: all exercises cued for breathing mechanics and pelvic floor coordination to decrease leakage and improve pelvic floor strength Pt consented to internal vaginal assessment this date and found to have decreased strength, coordination, and endurance however improved since eval, findings above in chart. 3x10 pelvic floor contractions [id number] quick flicks, benefits from cues for more consistent contractions and increased speed as able, improved with cue to count out loud for timing. Around rep 7 each set pt demonstrated need to stop and "reset" then able to complete following reps X10 4-7s isometrics with cues for improved activation. Pt able to consistently have st least 4-5s holds but intermittently with cues up to 7s Pt given and educated on bladder diary for improved understanding of urine frequency, urge strength, bladder void sizes and to decreased "just in case" urination.   09/10/22: NMRE: all exercises cued for breathing mechanics and pelvic floor coordination to decrease leakage and improve pelvic floor strength 2x10 bridges with ball squeezes  Opp hand/knee ball press  2x10 Hooklying bil elbow ext green band 2x10 Bird dog 2x10 each  2x10 Sit to stand from mat table body weights  Seated pelvic floor contractions 2x10 with towel roll, isometrics with towel rolls 3-5s x10, and J49 quick flicks, F02 mini coughs  Pt given bladder irritants and reviewed   PATIENT EDUCATION:  Education details: urge drill, HEP, bladder irritants, bladder diary    Person educated: Patient Education method: Explanation, Demonstration, Tactile cues, Verbal cues, and Handouts Education comprehension: verbalized understanding and returned demonstration  HOME EXERCISE PROGRAM: Urge drill,  [id number]  ASSESSMENT:  CLINICAL IMPRESSION: Patient session focused on internal pelvic floor treatment vaginally with emphasis on improve quality of contractions and activation and improve pt awareness of contraction. Pt improved with reps and verbal cues. Pt tolerated well and demonstrated improved strength, endurance, and coordination compared to eval. Pt also educated o bladder diary and denied additional questions. Pt  Pt would benefit from additional PT to further address deficits.    OBJECTIVE IMPAIRMENTS: decreased coordination, decreased endurance, decreased mobility, decreased strength, increased fascial restrictions, impaired flexibility,  improper body mechanics, and postural dysfunction.   ACTIVITY LIMITATIONS: carrying, squatting, transfers, continence, and locomotion level  PARTICIPATION LIMITATIONS: community activity and yard work  PERSONAL FACTORS: Fitness, Time since onset of injury/illness/exacerbation, and 1 comorbidity: medical history  are also affecting patient's functional outcome.   REHAB POTENTIAL: Good  CLINICAL DECISION MAKING: Stable/uncomplicated  EVALUATION COMPLEXITY: Low   GOALS: Goals reviewed with patient? Yes  SHORT TERM GOALS: Target date: 09/14/22  Pt to be I with HEP.  Baseline: Goal status: MET  2.  Pt will have 25% less urgency due to  bladder retraining and strengthening  Baseline:  Goal status: on going  3.  Pt to report improved time between bladder voids to at least 2 hours for improved QOL with decreased urinary frequency.   Baseline:  Goal status: on going  4.  Pt to demonstrate at least 3/5 pelvic floor strength for improved pelvic stability and decreased strain at pelvic floor/ decrease leakage.  Baseline:  Goal status: MET   LONG TERM GOALS: Target date: 11/17/22  Pt to be I with advanced HEP.  Baseline:  Goal status: INITIAL  2.  Pt to demonstrate at least 5/5 bil hip strength for improved pelvic stability and functional squats without leakage.  Baseline:  Goal status: INITIAL  3.  Pt will have 50% less urgency due to bladder retraining and strengthening  Baseline:  Goal status: INITIAL  4.  Pt to report improved time between bladder voids to at least 3 hours for improved QOL with decreased urinary frequency.   Baseline:  Goal status: INITIAL  5.  Pt to demonstrate at least 4/5 pelvic floor strength for improved pelvic stability and decreased strain at pelvic floor/ decrease leakage.  Baseline:  Goal status: INITIAL  6.  Pt to demonstrate improved coordination of pelvic floor and breathing mechanics with squatting 15# for decreased leakage.  Baseline:  Goal status: INITIAL  PLAN:  PT FREQUENCY: 1x/week  PT DURATION:  8 sessions  PLANNED INTERVENTIONS: Therapeutic exercises, Therapeutic activity, Neuromuscular re-education, Patient/Family education, Self Care, Joint mobilization, Aquatic Therapy, Dry Needling, Electrical stimulation, Spinal mobilization, Cryotherapy, Moist heat, scar mobilization, Taping, Biofeedback, and Manual therapy  PLAN FOR NEXT SESSION: internal NMRE if needed and pt consents, breathing and pelvic floor coordination training, hip/core strength  Stacy Gardner, PT, DPT 09/22/2309:19 AM

## 2022-09-30 ENCOUNTER — Ambulatory Visit: Payer: BC Managed Care – PPO | Attending: Obstetrics & Gynecology | Admitting: Physical Therapy

## 2022-09-30 DIAGNOSIS — M6281 Muscle weakness (generalized): Secondary | ICD-10-CM | POA: Diagnosis not present

## 2022-09-30 DIAGNOSIS — R293 Abnormal posture: Secondary | ICD-10-CM | POA: Diagnosis not present

## 2022-09-30 DIAGNOSIS — R279 Unspecified lack of coordination: Secondary | ICD-10-CM | POA: Diagnosis not present

## 2022-09-30 NOTE — Therapy (Addendum)
OUTPATIENT PHYSICAL THERAPY FEMALE PELVIC TREATMENT   Patient Name: Lacey Lee MRN: [id number] DOB:Apr 10, 1976, 47 y.o., female Today's Date: 09/30/2022  END OF SESSION:  PT End of Session - 09/30/22 1620     Visit Number 4    Date for PT Re-Evaluation 11/17/22    Authorization Type BCBS    PT Start Time 1618    PT Stop Time 8768   pt not feeling well today   PT Time Calculation (min) 32 min    Activity Tolerance Patient tolerated treatment well    Behavior During Therapy WFL for tasks assessed/performed             Past Medical History:  Diagnosis Date   Anemia    Anxiety    slight   Asthma    albuterol rescue inhaler-uses prn, exacerbated by pet dander   History of ectopic pregnancy 1999   S/P RIGHT SALPINGECTOMY   History of herpes simplex type 2 infection 2005   History of uterine fibroid    Neurofibromatosis ([place])    dx is child--  nonmalignant   Pneumonia    years ago   Seasonal allergies    Past Surgical History:  Procedure Laterality Date   CYSTOSCOPY N/A 05/11/2017   Procedure: CYSTOSCOPY;  Surgeon: Megan Salon, MD;  Location: [facility];  Service: Gynecology;  Laterality: N/A;   DILATATION & CURETTAGE/HYSTEROSCOPY WITH MYOSURE N/A 08/07/2016   Procedure: HYSTEROSCOPY WITH MYOSURE;  Surgeon: Megan Salon, MD;  Location: [facility];  Service: Gynecology;  Laterality: N/A;  Fibroid resection   IUD REMOVAL N/A 08/07/2016   Procedure: INTRAUTERINE DEVICE (IUD) REMOVAL;  Surgeon: Megan Salon, MD;  Location: [facility];  Service: Gynecology;  Laterality: N/A;   IUD REMOVAL N/A 05/11/2017   Procedure: INTRAUTERINE DEVICE (IUD) REMOVAL;  Surgeon: Megan Salon, MD;  Location: [facility];  Service: Gynecology;  Laterality: N/A;   LAPAROSCOPIC LYSIS OF ADHESIONS  05/11/2017   Procedure: LAPAROSCOPIC LYSIS OF ADHESIONS;  Surgeon: Megan Salon, MD;  Location: [facility];  Service: Gynecology;;  with cautery of endometroisis    LAPAROSCOPY FOR ECTOPIC PREGNANCY  2000   RIGHT SALPINGECTOMY   ROBOT ASSISTED MYOMECTOMY N/A 12/16/2012   Procedure: ROBOTIC ASSISTED MYOMECTOMY;  Surgeon: Governor Specking, MD;  Location: [facility];  Service: Gynecology;  Laterality: N/A;   ROBOTIC ASSISTED LAPAROSCOPIC LYSIS OF ADHESION  12/16/2012   Procedure: ROBOTIC ASSISTED LAPAROSCOPIC LYSIS OF ADHESION;  Surgeon: Governor Specking, MD;  Location: [facility];  Service: Gynecology;;  with excision of perotoneal lesions   TOTAL LAPAROSCOPIC HYSTERECTOMY WITH SALPINGECTOMY Bilateral 05/11/2017   Procedure: HYSTERECTOMY TOTAL LAPAROSCOPIC WITH LEFT SALPINGECTOMY;  Surgeon: Megan Salon, MD;  Location: [facility];  Service: Gynecology;  Laterality: Bilateral;   WISDOM TOOTH EXTRACTION     Patient Active Problem List   Diagnosis Date Noted   H/O: hysterectomy 07/03/2021   Allergy to alpha-gal 04/21/2020   Allergic conjunctivitis of both eyes 04/21/2020   Lumbar radiculopathy, right 06/13/2019   Patellofemoral arthritis of right knee 02/27/2019   IDA (iron deficiency anemia) 10/28/2018   Cervical stenosis of spinal canal 02/07/2018   Neurofibromatosis ([place]) 02/04/2017   Greater trochanteric bursitis of left hip 08/31/2016   Other allergic rhinitis 05/29/2012   Mild intermittent reactive airway disease 05/29/2012    PCP: Alroy Dust, [name], MD  REFERRING PROVIDER: Megan Salon, MD  REFERRING DIAG: N39.3 (ICD-10-CM) - Stress incontinence  THERAPY DIAG:  Muscle weakness (generalized)  Abnormal  posture  Unspecified lack of coordination  Rationale for Evaluation and Treatment: Rehabilitation  ONSET DATE: worse in the past year  SUBJECTIVE:                                                                                                                                                                                           SUBJECTIVE STATEMENT: Pt reports urgency is getting better and hasn't been having leakage with urgency as much (only  once since last visit). Usually about 45 mins - hour between voids now. Still consistent leakage with sneezing and coughing. Pt reports "We may need to cut this appointment early my stomach is a little upset today."  Fluid intake: Yes: water - "constantly but I don't know how much", about 4 cups of coffee each day    PAIN:  Are you having pain? No   PRECAUTIONS: None  WEIGHT BEARING RESTRICTIONS: No  FALLS:  Has patient fallen in last 6 months? No  LIVING ENVIRONMENT: Lives with: lives alone Lives in: House/apartment   OCCUPATION: lab   PLOF: Independent  PATIENT GOALS: to have less leakage  PERTINENT HISTORY:  Hysterectomy, anxiety, herpes type 2, uterine fibroid, neurofibromatosis, CYSTOSCOPY,  Sexual abuse: No  BOWEL MOVEMENT: Pain with bowel movement: No Type of bowel movement:Type (Bristol Stool Scale) 4, Frequency 3x per week, and Strain No Fully empty rectum: Yes:   Leakage: No Pads: No Fiber supplement: No  URINATION: Pain with urination: No Fully empty bladder: Yes:   Stream: Strong Urgency: Yes:   Frequency: about every 30 mins with drinking water more frequently not every night but sometimes 1x.  Leakage: Urge to void, Walking to the bathroom, Coughing, Sneezing, and Exercise Pads: Yes: wears pads 1x per day  INTERCOURSE: Pain with intercourse:  no pain Ability to have vaginal penetration:  Yes:   Climax: not painful Marinoff Scale: 0/3  PREGNANCY: Vaginal deliveries 0 Tearing No C-section deliveries 0 Currently pregnant No  PROLAPSE: None   OBJECTIVE:   DIAGNOSTIC FINDINGS:    COGNITION: Overall cognitive status: Within functional limits for tasks assessed     SENSATION: Light touch: Appears intact Proprioception: Appears intact  MUSCLE LENGTH: Bil hamstrings and adductors limited by 25%   POSTURE: rounded shoulders, forward head, and posterior pelvic tilt   LUMBARAROM/PROM:  A/PROM A/PROM  eval  Flexion Limited by 25%   Extension WFL  Right lateral flexion Limited by 25%  Left lateral flexion Limited by 25%  Right rotation Limited by 25%  Left rotation Limited by 25%   (Blank rows = not tested)  LOWER EXTREMITY ROM:  WFL  LOWER EXTREMITY MMT:  Bil hips grossly 4-/5, knees and ankles 5/5  PALPATION:   General  mild TTP at Lt anterior hip and over bladder with fascial restrictions here                External Perineal Exam [facility]                             Internal Pelvic Floor [facility]  Patient confirms identification and approves PT to assess internal pelvic floor and treatment Yes  PELVIC MMT:   MMT eval 09/22/22   Vaginal 2/5, 2s, 5 reps 3/5, 5s, 7 reps  Internal Anal Sphincter    External Anal Sphincter    Puborectalis    Diastasis Recti    (Blank rows = not tested)        TONE: WFL  PROLAPSE: Not seen in hooklying   TODAY'S TREATMENT:                                                                                                                                09/30/22:  NMRE: all exercises cued for breathing mechanics and pelvic floor coordination to decrease leakage and improve pelvic floor strength 2x10 bridges with ball squeezes  same hand/knee ball press 2x10 Red loop hooklying marching 2x10 alternating  Red loop hooklying hip abduction 2x10 Bird dog 2x10 each  2x10 Sit to stand from mat table body weight     PATIENT EDUCATION:  Education details: urge drill, HEP, bladder irritants, bladder diary    Person educated: Patient Education method: Explanation, Demonstration, Tactile cues, Verbal cues, and Handouts Education comprehension: verbalized understanding and returned demonstration  HOME EXERCISE PROGRAM: Urge drill,  [id number]  ASSESSMENT:  CLINICAL IMPRESSION: Patient session focused on hip and core strengthening with emphasis on coordination of breathing mechanics to decreased strain at pelvic floor and decreased leakage as well as improve pelvic floor  strength. Pt demonstrated continued need of verbal cues for coordination and to not valsalva for exercises which pt demonstrates consistently with initial attempts, able to improve with these cues. Pt also progressing to home HEP with pelvic floor contractions in sitting now instead of hooklying/supine.  Pt would benefit from additional PT to further address deficits.    OBJECTIVE IMPAIRMENTS: decreased coordination, decreased endurance, decreased mobility, decreased strength, increased fascial restrictions, impaired flexibility, improper body mechanics, and postural dysfunction.   ACTIVITY LIMITATIONS: carrying, squatting, transfers, continence, and locomotion level  PARTICIPATION LIMITATIONS: community activity and yard work  PERSONAL FACTORS: Fitness, Time since onset of injury/illness/exacerbation, and 1 comorbidity: medical history  are also affecting patient's functional outcome.   REHAB POTENTIAL: Good  CLINICAL DECISION MAKING: Stable/uncomplicated  EVALUATION COMPLEXITY: Low   GOALS: Goals reviewed with patient? Yes  SHORT TERM GOALS: Target date: 09/14/22  Pt to be I with HEP.  Baseline: Goal status: MET  2.  Pt will have 25% less urgency due to bladder  retraining and strengthening  Baseline:  Goal status: on going  3.  Pt to report improved time between bladder voids to at least 2 hours for improved QOL with decreased urinary frequency.   Baseline:  Goal status: on going  4.  Pt to demonstrate at least 3/5 pelvic floor strength for improved pelvic stability and decreased strain at pelvic floor/ decrease leakage.  Baseline:  Goal status: MET   LONG TERM GOALS: Target date: 11/17/22  Pt to be I with advanced HEP.  Baseline:  Goal status: INITIAL  2.  Pt to demonstrate at least 5/5 bil hip strength for improved pelvic stability and functional squats without leakage.  Baseline:  Goal status: INITIAL  3.  Pt will have 50% less urgency due to bladder retraining  and strengthening  Baseline:  Goal status: INITIAL  4.  Pt to report improved time between bladder voids to at least 3 hours for improved QOL with decreased urinary frequency.   Baseline:  Goal status: INITIAL  5.  Pt to demonstrate at least 4/5 pelvic floor strength for improved pelvic stability and decreased strain at pelvic floor/ decrease leakage.  Baseline:  Goal status: INITIAL  6.  Pt to demonstrate improved coordination of pelvic floor and breathing mechanics with squatting 15# for decreased leakage.  Baseline:  Goal status: INITIAL  PLAN:  PT FREQUENCY: 1x/week  PT DURATION:  8 sessions  PLANNED INTERVENTIONS: Therapeutic exercises, Therapeutic activity, Neuromuscular re-education, Patient/Family education, Self Care, Joint mobilization, Aquatic Therapy, Dry Needling, Electrical stimulation, Spinal mobilization, Cryotherapy, Moist heat, scar mobilization, Taping, Biofeedback, and Manual therapy  PLAN FOR NEXT SESSION: internal NMRE if needed and pt consents, breathing and pelvic floor coordination training, hip/core strength  Stacy Gardner, PT, DPT 01/03/244:52 PM    PHYSICAL THERAPY DISCHARGE SUMMARY  Visits from Start of Care: 4  Current functional level related to goals / functional outcomes: See above. Patient called on 10/15/22 to be discharged.    Remaining deficits: See above.    Education / Equipment: HEP   Patient agrees to discharge. Patient goals were not met. Patient is being discharged due to the patient's request. Thank you for the referral. Earlie Counts, PT 10/15/22 11:33 AM

## 2022-10-05 ENCOUNTER — Encounter: Payer: BC Managed Care – PPO | Admitting: Physical Therapy

## 2022-10-07 ENCOUNTER — Encounter: Payer: Self-pay | Admitting: Physical Therapy

## 2022-10-12 ENCOUNTER — Ambulatory Visit: Payer: BC Managed Care – PPO | Admitting: Physical Therapy

## 2022-10-19 ENCOUNTER — Encounter: Payer: BC Managed Care – PPO | Admitting: Physical Therapy

## 2022-10-26 ENCOUNTER — Encounter: Payer: BC Managed Care – PPO | Admitting: Physical Therapy

## 2022-11-02 ENCOUNTER — Encounter: Payer: BC Managed Care – PPO | Admitting: Physical Therapy

## 2022-11-18 ENCOUNTER — Encounter: Payer: BC Managed Care – PPO | Admitting: Physical Therapy

## 2022-12-04 ENCOUNTER — Encounter (HOSPITAL_BASED_OUTPATIENT_CLINIC_OR_DEPARTMENT_OTHER): Payer: Self-pay | Admitting: Obstetrics & Gynecology

## 2022-12-07 ENCOUNTER — Encounter (HOSPITAL_BASED_OUTPATIENT_CLINIC_OR_DEPARTMENT_OTHER): Payer: Self-pay | Admitting: Obstetrics & Gynecology

## 2022-12-07 ENCOUNTER — Telehealth (INDEPENDENT_AMBULATORY_CARE_PROVIDER_SITE_OTHER): Payer: BC Managed Care – PPO | Admitting: Obstetrics & Gynecology

## 2022-12-07 DIAGNOSIS — N951 Menopausal and female climacteric states: Secondary | ICD-10-CM

## 2022-12-07 NOTE — Progress Notes (Signed)
Virtual Visit via Telephone Note  I connected with Lacey Lee on 12/07/22 at  4:15 PM EDT by telephone and verified that I am speaking with the correct person using two identifiers.  Location: Patient: work Provider: office    I discussed the limitations, risks, security and privacy concerns of performing an evaluation and management service by telephone and the availability of in person appointments. I also discussed with the patient that there may be a patient responsible charge related to this service. The patient expressed understanding and agreed to proceed.   History of Present Illness: 47 yo AA female for virtual visit due to concerns about hot flashes.  She reports these started about a month ago.  Is having hot flashes at night and during the day as well.  These aren't causing her sleep disturbance.  She has noticed some vaginal dryness as well as some mood changes.  None of this is really severe but she doesn't know if it's going to worsen or not.  Options for treatment include OTC products like estroven, black cohosh alone or prescription options like paxil or gabapentin. Discussed.  HRT discussed as well.  For now, she would like to not be on hormonal therapy and would like to try OTC options.  These were written down for her for reference and will be in her after visit summary.  H/o hysterectomy so denies vaginal bleeding.  PMed hx, medications, allergies reviewed/updated.   Observations/Objective: WNWD AA, NAD  Assessment and Plan: Vasomotor symptoms  Follow Up Instructions: Ms. Haist is going to start with OTC options and give update in a few weeks.   I discussed the assessment and treatment plan with the patient. The patient was provided an opportunity to ask questions and all were answered. The patient agreed with the plan and demonstrated an understanding of the instructions.   The patient was advised to call back or seek an in-person evaluation if the  symptoms worsen or if the condition fails to improve as anticipated.  I provided 20 minutes of non-face-to-face time during this encounter.   Megan Salon, MD

## 2022-12-07 NOTE — Patient Instructions (Addendum)
Estroven -- over the counter medication  Evening primrose and black cohosh  Gabapentin  Paxil/paroxitene '10mg'$   Veozah  Estrogens-- prescriptions  Progesterone cream -over the counter (whole foods)  Vit E gel or Replens vaginal moisturizing (non hormonal)

## 2023-01-21 ENCOUNTER — Encounter: Payer: Self-pay | Admitting: Family

## 2023-03-30 ENCOUNTER — Other Ambulatory Visit: Payer: Self-pay | Admitting: Obstetrics & Gynecology

## 2023-03-30 DIAGNOSIS — Z1231 Encounter for screening mammogram for malignant neoplasm of breast: Secondary | ICD-10-CM

## 2023-04-27 ENCOUNTER — Ambulatory Visit
Admission: RE | Admit: 2023-04-27 | Discharge: 2023-04-27 | Disposition: A | Discharge status: Home / Self Care | Payer: BC Managed Care – PPO | Source: Ambulatory Visit

## 2023-04-27 DIAGNOSIS — Z1231 Encounter for screening mammogram for malignant neoplasm of breast: Secondary | ICD-10-CM

## 2023-05-04 DIAGNOSIS — Z0000 Encounter for general adult medical examination without abnormal findings: Secondary | ICD-10-CM | POA: Diagnosis not present

## 2023-09-06 DIAGNOSIS — Q8501 Neurofibromatosis, type 1: Secondary | ICD-10-CM | POA: Diagnosis not present

## 2023-10-29 ENCOUNTER — Ambulatory Visit
Admission: EM | Admit: 2023-10-29 | Discharge: 2023-10-29 | Disposition: A | Discharge status: Home / Self Care | Payer: BC Managed Care – PPO | Attending: Family Medicine | Admitting: Family Medicine

## 2023-10-29 DIAGNOSIS — J40 Bronchitis, not specified as acute or chronic: Secondary | ICD-10-CM | POA: Diagnosis not present

## 2023-10-29 DIAGNOSIS — J309 Allergic rhinitis, unspecified: Secondary | ICD-10-CM

## 2023-10-29 DIAGNOSIS — J329 Chronic sinusitis, unspecified: Secondary | ICD-10-CM | POA: Diagnosis not present

## 2023-10-29 MED ORDER — AMOXICILLIN-POT CLAVULANATE 875-125 MG PO TABS: 1.0000 | ORAL_TABLET | Freq: Two times a day (BID) | ORAL | 0 refills | Status: AC

## 2023-10-29 MED ORDER — ALBUTEROL SULFATE HFA 108 (90 BASE) MCG/ACT IN AERS: 2.0000 | INHALATION_SPRAY | Freq: Four times a day (QID) | RESPIRATORY_TRACT | 0 refills | Status: AC | PRN

## 2023-10-29 MED ORDER — PREDNISONE 20 MG PO TABS: ORAL_TABLET | ORAL | 0 refills | Status: AC

## 2023-10-29 MED ORDER — PROMETHAZINE-DM 6.25-15 MG/5ML PO SYRP: 5.0000 mL | ORAL_SOLUTION | Freq: Three times a day (TID) | ORAL | 0 refills | Status: AC | PRN

## 2023-10-29 NOTE — Discharge Instructions (Signed)
Start Augmentin to help with the infection sinobronchitis and use prednisone and albuterol to help with a respiratory boost. Use Tylenol as needed for fever, aches and pains. Use cough syrup as needed as well.

## 2023-10-29 NOTE — ED Provider Notes (Signed)
[facility]  Note:  This document was prepared using Conservation officer, historic buildings and may include unintentional dictation errors.  MRN: [id number] DOB: 1976-03-22  Subjective:   Lacey Lee is a 48 y.o. female presenting for 1 month history of persistent coughing, congestion in her sinuses and chest, chest tightness, wheezing, shortness of breath, sinus drainage, malaise and fatigue.  Has felt intermittent facial pain as well.  Has a history of asthma, allergies.  Needs a refill of her inhaler.  No smoking of any kind including cigarettes, cigars, vaping, marijuana use.    No current facility-administered medications for this encounter.  Current Outpatient Medications:    albuterol (PROVENTIL HFA;VENTOLIN HFA) 108 (90 BASE) MCG/ACT inhaler, Inhale 2 puffs into the lungs every 6 (six) hours as needed for wheezing or shortness of breath. , Disp: , Rfl:    Azelastine-Fluticasone 137-50 MCG/ACT SUSP, Place 1 spray into the nose in the morning and at bedtime., Disp: 23 g, Rfl: 5   cetirizine (ZYRTEC) 10 MG tablet, Take 10 mg by mouth daily., Disp: , Rfl:    Vitamin D, Ergocalciferol, (DRISDOL) 1.25 MG (50000 UNIT) CAPS capsule, TAKE ONE CAPSULE BY MOUTH EVERY 7 DAYS, Disp: 12 capsule, Rfl: 3   Allergies  Allergen Reactions   Doxycycline Hyclate Nausea Only   Latex Other (See Comments)    DERMITITIS   Other Hives and Swelling    PORK (alphagal)-lip/tongue swelling, hives   Tramadol Nausea And Vomiting    Past Medical History:  Diagnosis Date   Anemia    Anxiety    slight   Asthma    albuterol rescue inhaler-uses prn, exacerbated by pet dander   History of ectopic pregnancy 1999   S/P RIGHT SALPINGECTOMY   History of herpes simplex type 2 infection 2005   History of uterine fibroid    Neurofibromatosis (HCC)    dx is child--  nonmalignant   Pneumonia    years ago   Seasonal allergies      Past Surgical History:  Procedure Laterality  Date   CYSTOSCOPY N/A 05/11/2017   Procedure: CYSTOSCOPY;  Surgeon: Jerene Bears, MD;  Location: [facility];  Service: Gynecology;  Laterality: N/A;   DILATATION & CURETTAGE/HYSTEROSCOPY WITH MYOSURE N/A 08/07/2016   Procedure: HYSTEROSCOPY WITH MYOSURE;  Surgeon: Jerene Bears, MD;  Location: [facility];  Service: Gynecology;  Laterality: N/A;  Fibroid resection   IUD REMOVAL N/A 08/07/2016   Procedure: INTRAUTERINE DEVICE (IUD) REMOVAL;  Surgeon: Jerene Bears, MD;  Location: [facility];  Service: Gynecology;  Laterality: N/A;   IUD REMOVAL N/A 05/11/2017   Procedure: INTRAUTERINE DEVICE (IUD) REMOVAL;  Surgeon: Jerene Bears, MD;  Location: [facility];  Service: Gynecology;  Laterality: N/A;   LAPAROSCOPIC LYSIS OF ADHESIONS  05/11/2017   Procedure: LAPAROSCOPIC LYSIS OF ADHESIONS;  Surgeon: Jerene Bears, MD;  Location: [facility];  Service: Gynecology;;  with cautery of endometroisis   LAPAROSCOPY FOR ECTOPIC PREGNANCY  2000   RIGHT SALPINGECTOMY   ROBOT ASSISTED MYOMECTOMY N/A 12/16/2012   Procedure: ROBOTIC ASSISTED MYOMECTOMY;  Surgeon: Fermin Schwab, MD;  Location: [facility];  Service: Gynecology;  Laterality: N/A;   ROBOTIC ASSISTED LAPAROSCOPIC LYSIS OF ADHESION  12/16/2012   Procedure: ROBOTIC ASSISTED LAPAROSCOPIC LYSIS OF ADHESION;  Surgeon: Fermin Schwab, MD;  Location: [facility];  Service: Gynecology;;  with excision of perotoneal lesions   TOTAL LAPAROSCOPIC HYSTERECTOMY WITH SALPINGECTOMY Bilateral 05/11/2017   Procedure: HYSTERECTOMY TOTAL  LAPAROSCOPIC WITH LEFT SALPINGECTOMY;  Surgeon: Jerene Bears, MD;  Location: [facility];  Service: Gynecology;  Laterality: Bilateral;   WISDOM TOOTH EXTRACTION      Family History  Adopted: Yes  Problem Relation Age of Onset   Colon polyps Mother    Allergies Mother        food   Hypertension Mother    Hyperlipidemia Mother    Heart failure Mother    Thyroid disease Mother    Diabetes Mother    Lung cancer  Father    Diabetes Sister    Hypertension Sister    Renal Disease Sister        dialysis   Colon polyps Maternal Aunt    Uterine cancer Paternal Grandmother    Breast cancer Neg Hx    Colon cancer Neg Hx    Esophageal cancer Neg Hx    Stomach cancer Neg Hx    Rectal cancer Neg Hx     Social History   Tobacco Use   Smoking status: Never   Smokeless tobacco: Never  Vaping Use   Vaping status: Never Used  Substance Use Topics   Alcohol use: Yes    Comment: occ   Drug use: No    ROS   Objective:   Vitals: BP 111/74 (BP Location: Right Arm)   Pulse 74   Temp 98.8 F (37.1 C) (Oral)   Resp 16   Wt 212 lb (96.2 kg)   LMP 05/07/2017   SpO2 99%   BMI 38.78 kg/m   Physical Exam Constitutional:      General: She is not in acute distress.    Appearance: Normal appearance. She is well-developed and normal weight. She is not ill-appearing, toxic-appearing or diaphoretic.  HENT:     Head: Normocephalic and atraumatic.     Right Ear: Tympanic membrane, ear canal and external ear normal. No drainage or tenderness. No middle ear effusion. There is no impacted cerumen. Tympanic membrane is not erythematous or bulging.     Left Ear: Tympanic membrane, ear canal and external ear normal. No drainage or tenderness.  No middle ear effusion. There is no impacted cerumen. Tympanic membrane is not erythematous or bulging.     Nose: Congestion present. No rhinorrhea.     Mouth/Throat:     Mouth: Mucous membranes are moist. No oral lesions.     Pharynx: Posterior oropharyngeal erythema (with significant postnasal drainage overlying pharynx) present. No pharyngeal swelling, oropharyngeal exudate or uvula swelling.     Tonsils: No tonsillar exudate or tonsillar abscesses.  Eyes:     General: No scleral icterus.       Right eye: No discharge.        Left eye: No discharge.     Extraocular Movements: Extraocular movements intact.     Right eye: Normal extraocular motion.     Left eye:  Normal extraocular motion.     Conjunctiva/sclera: Conjunctivae normal.  Cardiovascular:     Rate and Rhythm: Normal rate and regular rhythm.     Heart sounds: Normal heart sounds. No murmur heard.    No friction rub. No gallop.  Pulmonary:     Effort: Pulmonary effort is normal. No respiratory distress.     Breath sounds: No stridor. Rhonchi (light over mid lung fields bilaterally) present. No wheezing or rales.  Chest:     Chest wall: No tenderness.  Musculoskeletal:     Cervical back: Normal range of motion and neck supple.  Lymphadenopathy:  Cervical: No cervical adenopathy.  Skin:    General: Skin is warm and dry.  Neurological:     General: No focal deficit present.     Mental Status: She is alert and oriented to person, place, and time.  Psychiatric:        Mood and Affect: Mood normal.        Behavior: Behavior normal.      Assessment and Plan :   PDMP not reviewed this encounter.  1. Sinobronchitis   2. Allergic rhinitis, unspecified seasonality, unspecified trigger     Will manage for sinobronchitis with Augmentin, prednisone. Refilled albuterol inhaler, recommend supportive care otherwise.  Will defer imaging for now.  Counseled patient on potential for adverse effects with medications prescribed/recommended today, ER and return-to-clinic precautions discussed, patient verbalized understanding.    Wallis Bamberg, [place] 10/30/23 [phone]

## 2023-10-29 NOTE — ED Triage Notes (Signed)
Pt c/o dry cough, pain to left upper back, chest tightness/wheezing-sx started Jan 1 x 1.5 weeks-"back again this week"-denies fever-NAD-steady gait

## 2023-10-30 ENCOUNTER — Ambulatory Visit: Payer: Self-pay

## 2023-11-01 ENCOUNTER — Encounter (HOSPITAL_BASED_OUTPATIENT_CLINIC_OR_DEPARTMENT_OTHER): Payer: Self-pay | Admitting: Obstetrics & Gynecology

## 2024-03-15 ENCOUNTER — Other Ambulatory Visit: Payer: Self-pay | Admitting: Certified Registered"

## 2024-03-15 DIAGNOSIS — Z1231 Encounter for screening mammogram for malignant neoplasm of breast: Secondary | ICD-10-CM

## 2024-04-18 ENCOUNTER — Other Ambulatory Visit: Payer: Self-pay | Admitting: Obstetrics & Gynecology

## 2024-04-18 DIAGNOSIS — Z1231 Encounter for screening mammogram for malignant neoplasm of breast: Secondary | ICD-10-CM

## 2024-04-28 ENCOUNTER — Encounter

## 2024-04-28 DIAGNOSIS — Z1231 Encounter for screening mammogram for malignant neoplasm of breast: Secondary | ICD-10-CM

## 2024-05-01 DIAGNOSIS — E669 Obesity, unspecified: Secondary | ICD-10-CM | POA: Diagnosis not present

## 2024-05-01 DIAGNOSIS — E7800 Pure hypercholesterolemia, unspecified: Secondary | ICD-10-CM | POA: Diagnosis not present

## 2024-05-01 DIAGNOSIS — N951 Menopausal and female climacteric states: Secondary | ICD-10-CM | POA: Diagnosis not present

## 2024-05-01 DIAGNOSIS — Z1159 Encounter for screening for other viral diseases: Secondary | ICD-10-CM | POA: Diagnosis not present

## 2024-05-02 ENCOUNTER — Encounter (HOSPITAL_BASED_OUTPATIENT_CLINIC_OR_DEPARTMENT_OTHER): Admitting: Radiology

## 2024-05-04 DIAGNOSIS — E7800 Pure hypercholesterolemia, unspecified: Secondary | ICD-10-CM | POA: Diagnosis not present

## 2024-05-04 DIAGNOSIS — Z1159 Encounter for screening for other viral diseases: Secondary | ICD-10-CM | POA: Diagnosis not present

## 2024-05-04 DIAGNOSIS — J309 Allergic rhinitis, unspecified: Secondary | ICD-10-CM | POA: Diagnosis not present

## 2024-05-04 DIAGNOSIS — E669 Obesity, unspecified: Secondary | ICD-10-CM | POA: Diagnosis not present

## 2024-05-04 DIAGNOSIS — Z0189 Encounter for other specified special examinations: Secondary | ICD-10-CM | POA: Diagnosis not present

## 2024-05-09 ENCOUNTER — Encounter: Payer: Self-pay | Admitting: Family

## 2024-05-09 ENCOUNTER — Ambulatory Visit
Admission: RE | Admit: 2024-05-09 | Discharge: 2024-05-09 | Disposition: A | Discharge status: Home / Self Care | Source: Ambulatory Visit | Attending: Obstetrics & Gynecology | Admitting: Obstetrics & Gynecology

## 2024-05-09 DIAGNOSIS — Z1231 Encounter for screening mammogram for malignant neoplasm of breast: Secondary | ICD-10-CM

## 2024-06-05 DIAGNOSIS — R799 Abnormal finding of blood chemistry, unspecified: Secondary | ICD-10-CM | POA: Diagnosis not present

## 2024-06-05 DIAGNOSIS — R7989 Other specified abnormal findings of blood chemistry: Secondary | ICD-10-CM | POA: Diagnosis not present
